# Patient Record
Sex: Female | Born: 1935 | Race: White | Hispanic: No | Marital: Married | Smoking: Never smoker
Health system: Southern US, Community
[De-identification: ages and names within clinical notes are randomized; demographics above are authoritative.]

## PROBLEM LIST (undated history)

## (undated) DIAGNOSIS — I10 Essential (primary) hypertension: Secondary | ICD-10-CM

## (undated) DIAGNOSIS — N189 Chronic kidney disease, unspecified: Secondary | ICD-10-CM

## (undated) DIAGNOSIS — K219 Gastro-esophageal reflux disease without esophagitis: Secondary | ICD-10-CM

## (undated) DIAGNOSIS — C801 Malignant (primary) neoplasm, unspecified: Secondary | ICD-10-CM

## (undated) DIAGNOSIS — D649 Anemia, unspecified: Secondary | ICD-10-CM

## (undated) DIAGNOSIS — R011 Cardiac murmur, unspecified: Secondary | ICD-10-CM

## (undated) DIAGNOSIS — M109 Gout, unspecified: Secondary | ICD-10-CM

## (undated) HISTORY — DX: Chronic kidney disease, unspecified: N18.9

## (undated) HISTORY — PX: EYE SURGERY: SHX253

## (undated) HISTORY — PX: PARATHYROIDECTOMY: SHX19

## (undated) HISTORY — PX: BREAST LUMPECTOMY: SHX2

## (undated) HISTORY — PX: APPENDECTOMY: SHX54

---

## 1998-02-01 ENCOUNTER — Ambulatory Visit (HOSPITAL_COMMUNITY): Admission: RE | Admit: 1998-02-01 | Discharge: 1998-02-01 | Payer: Self-pay | Admitting: Gastroenterology

## 1998-07-05 ENCOUNTER — Encounter: Payer: Self-pay | Admitting: General Surgery

## 1998-07-08 ENCOUNTER — Ambulatory Visit (HOSPITAL_COMMUNITY): Admission: RE | Admit: 1998-07-08 | Discharge: 1998-07-08 | Payer: Self-pay | Admitting: General Surgery

## 1998-10-08 ENCOUNTER — Other Ambulatory Visit: Admission: RE | Admit: 1998-10-08 | Discharge: 1998-10-08 | Payer: Self-pay | Admitting: Internal Medicine

## 1999-10-12 ENCOUNTER — Other Ambulatory Visit: Admission: RE | Admit: 1999-10-12 | Discharge: 1999-10-12 | Payer: Self-pay | Admitting: Internal Medicine

## 2000-03-12 ENCOUNTER — Encounter: Payer: Self-pay | Admitting: Orthopedic Surgery

## 2000-03-12 ENCOUNTER — Encounter: Admission: RE | Admit: 2000-03-12 | Discharge: 2000-03-12 | Payer: Self-pay | Admitting: Orthopedic Surgery

## 2001-07-31 ENCOUNTER — Ambulatory Visit (HOSPITAL_COMMUNITY): Admission: RE | Admit: 2001-07-31 | Discharge: 2001-07-31 | Payer: Self-pay | Admitting: Specialist

## 2002-03-19 ENCOUNTER — Encounter: Admission: RE | Admit: 2002-03-19 | Discharge: 2002-03-19 | Payer: Self-pay | Admitting: Internal Medicine

## 2002-03-19 ENCOUNTER — Encounter: Payer: Self-pay | Admitting: Internal Medicine

## 2002-12-08 ENCOUNTER — Ambulatory Visit (HOSPITAL_BASED_OUTPATIENT_CLINIC_OR_DEPARTMENT_OTHER): Admission: RE | Admit: 2002-12-08 | Discharge: 2002-12-08 | Payer: Self-pay | Admitting: General Surgery

## 2002-12-10 ENCOUNTER — Encounter: Payer: Self-pay | Admitting: General Surgery

## 2002-12-10 ENCOUNTER — Encounter (INDEPENDENT_AMBULATORY_CARE_PROVIDER_SITE_OTHER): Payer: Self-pay | Admitting: *Deleted

## 2002-12-10 ENCOUNTER — Ambulatory Visit (HOSPITAL_BASED_OUTPATIENT_CLINIC_OR_DEPARTMENT_OTHER): Admission: RE | Admit: 2002-12-10 | Discharge: 2002-12-10 | Payer: Self-pay | Admitting: General Surgery

## 2002-12-15 ENCOUNTER — Ambulatory Visit (HOSPITAL_COMMUNITY): Admission: RE | Admit: 2002-12-15 | Discharge: 2002-12-15 | Payer: Self-pay | Admitting: Internal Medicine

## 2002-12-26 ENCOUNTER — Ambulatory Visit: Admission: RE | Admit: 2002-12-26 | Discharge: 2003-02-18 | Payer: Self-pay | Admitting: Radiation Oncology

## 2003-07-07 ENCOUNTER — Emergency Department (HOSPITAL_COMMUNITY): Admission: EM | Admit: 2003-07-07 | Discharge: 2003-07-08 | Payer: Self-pay | Admitting: Emergency Medicine

## 2003-09-24 ENCOUNTER — Ambulatory Visit: Admission: RE | Admit: 2003-09-24 | Discharge: 2003-09-24 | Payer: Self-pay | Admitting: Radiation Oncology

## 2004-09-22 ENCOUNTER — Ambulatory Visit: Payer: Self-pay | Admitting: Oncology

## 2004-12-05 ENCOUNTER — Encounter: Admission: RE | Admit: 2004-12-05 | Discharge: 2004-12-05 | Payer: Self-pay | Admitting: Internal Medicine

## 2004-12-14 ENCOUNTER — Ambulatory Visit: Payer: Self-pay | Admitting: Oncology

## 2005-03-01 ENCOUNTER — Ambulatory Visit (HOSPITAL_COMMUNITY): Admission: RE | Admit: 2005-03-01 | Discharge: 2005-03-02 | Payer: Self-pay | Admitting: Nephrology

## 2005-03-01 ENCOUNTER — Encounter (INDEPENDENT_AMBULATORY_CARE_PROVIDER_SITE_OTHER): Payer: Self-pay | Admitting: *Deleted

## 2005-06-07 ENCOUNTER — Ambulatory Visit: Payer: Self-pay | Admitting: Oncology

## 2005-08-01 ENCOUNTER — Ambulatory Visit: Payer: Self-pay | Admitting: Oncology

## 2005-09-22 ENCOUNTER — Ambulatory Visit: Payer: Self-pay | Admitting: Oncology

## 2005-11-14 ENCOUNTER — Ambulatory Visit: Payer: Self-pay | Admitting: Oncology

## 2005-11-28 LAB — CBC & DIFF AND RETIC
BASO%: 0.4 % (ref 0.0–2.0)
Basophils Absolute: 0 10*3/uL (ref 0.0–0.1)
EOS%: 9 % — ABNORMAL HIGH (ref 0.0–7.0)
Eosinophils Absolute: 0.7 10*3/uL — ABNORMAL HIGH (ref 0.0–0.5)
HCT: 32.2 % — ABNORMAL LOW (ref 34.8–46.6)
HGB: 10.9 g/dL — ABNORMAL LOW (ref 11.6–15.9)
IRF: 0.38 — ABNORMAL HIGH (ref 0.130–0.330)
LYMPH%: 10.3 % — ABNORMAL LOW (ref 14.0–48.0)
MCH: 31.2 pg (ref 26.0–34.0)
MCHC: 33.8 g/dL (ref 32.0–36.0)
MCV: 92.1 fL (ref 81.0–101.0)
MONO#: 1 10*3/uL — ABNORMAL HIGH (ref 0.1–0.9)
MONO%: 12.2 % (ref 0.0–13.0)
NEUT#: 5.4 10*3/uL (ref 1.5–6.5)
NEUT%: 68.1 % (ref 39.6–76.8)
Platelets: 253 10*3/uL (ref 145–400)
RBC: 3.49 10*6/uL — ABNORMAL LOW (ref 3.70–5.32)
RDW: 18.9 % — ABNORMAL HIGH (ref 11.3–14.5)
RETIC #: 68.4 10*3/uL (ref 19.7–115.1)
Retic %: 2 % (ref 0.4–2.3)
WBC: 8 10*3/uL (ref 3.9–10.0)
lymph#: 0.8 10*3/uL — ABNORMAL LOW (ref 0.9–3.3)

## 2005-11-28 LAB — COMPREHENSIVE METABOLIC PANEL
ALT: 18 U/L (ref 0–40)
CO2: 25 mEq/L (ref 19–32)
Creatinine, Ser: 2 mg/dL — ABNORMAL HIGH (ref 0.4–1.2)
Total Bilirubin: 0.4 mg/dL (ref 0.3–1.2)

## 2005-11-28 LAB — FERRITIN: Ferritin: 162 ng/mL (ref 10–291)

## 2005-11-28 LAB — IRON AND TIBC
Iron: 93 ug/dL (ref 42–145)
TIBC: 328 ug/dL (ref 250–470)
UIBC: 235 ug/dL

## 2005-11-28 LAB — LACTATE DEHYDROGENASE: LDH: 193 U/L (ref 94–250)

## 2005-12-26 LAB — CBC WITH DIFFERENTIAL/PLATELET
Basophils Absolute: 0.1 10*3/uL (ref 0.0–0.1)
EOS%: 7.3 % — ABNORMAL HIGH (ref 0.0–7.0)
Eosinophils Absolute: 0.7 10*3/uL — ABNORMAL HIGH (ref 0.0–0.5)
HCT: 35.9 % (ref 34.8–46.6)
HGB: 12 g/dL (ref 11.6–15.9)
MCH: 31.7 pg (ref 26.0–34.0)
MCV: 95 fL (ref 81.0–101.0)
NEUT#: 7.1 10*3/uL — ABNORMAL HIGH (ref 1.5–6.5)
NEUT%: 74.1 % (ref 39.6–76.8)
RDW: 14.7 % — ABNORMAL HIGH (ref 11.3–14.5)
lymph#: 1 10*3/uL (ref 0.9–3.3)

## 2005-12-28 LAB — UIFE/LIGHT CHAINS/TP QN, 24-HR UR
Albumin, U: DETECTED
Alpha 1, Urine: DETECTED — AB
Alpha 2, Urine: DETECTED — AB
Free Kappa/Lambda Ratio: 13.41 ratio — ABNORMAL HIGH (ref 0.46–4.00)
Free Lambda Excretion/Day: 5.46 mg/d
Time: 24 hours
Total Protein, Urine: 9.2 mg/dL

## 2006-01-18 ENCOUNTER — Ambulatory Visit: Payer: Self-pay | Admitting: Oncology

## 2006-01-23 LAB — CBC WITH DIFFERENTIAL/PLATELET
BASO%: 0.3 % (ref 0.0–2.0)
HCT: 38.3 % (ref 34.8–46.6)
LYMPH%: 19.4 % (ref 14.0–48.0)
MCH: 31.2 pg (ref 26.0–34.0)
MCHC: 33.3 g/dL (ref 32.0–36.0)
MONO#: 0.7 10*3/uL (ref 0.1–0.9)
NEUT%: 55.1 % (ref 39.6–76.8)
Platelets: 290 10*3/uL (ref 145–400)

## 2006-02-20 LAB — CBC WITH DIFFERENTIAL/PLATELET
BASO%: 0.3 % (ref 0.0–2.0)
Basophils Absolute: 0 10*3/uL (ref 0.0–0.1)
EOS%: 11.2 % — ABNORMAL HIGH (ref 0.0–7.0)
HCT: 37.4 % (ref 34.8–46.6)
HGB: 12.5 g/dL (ref 11.6–15.9)
MCH: 30.9 pg (ref 26.0–34.0)
MONO#: 0.8 10*3/uL (ref 0.1–0.9)
NEUT%: 62.9 % (ref 39.6–76.8)
RDW: 15.2 % — ABNORMAL HIGH (ref 11.3–14.5)
WBC: 7.1 10*3/uL (ref 3.9–10.0)
lymph#: 1.1 10*3/uL (ref 0.9–3.3)

## 2006-03-15 ENCOUNTER — Ambulatory Visit: Payer: Self-pay | Admitting: Oncology

## 2006-03-20 LAB — CBC WITH DIFFERENTIAL/PLATELET
BASO%: 0.1 % (ref 0.0–2.0)
EOS%: 2.2 % (ref 0.0–7.0)
MCH: 30.9 pg (ref 26.0–34.0)
MCHC: 33.8 g/dL (ref 32.0–36.0)
MONO#: 1 10*3/uL — ABNORMAL HIGH (ref 0.1–0.9)
RBC: 3.85 10*6/uL (ref 3.70–5.32)
RDW: 16.4 % — ABNORMAL HIGH (ref 11.3–14.5)
WBC: 12.3 10*3/uL — ABNORMAL HIGH (ref 3.9–10.0)
lymph#: 1 10*3/uL (ref 0.9–3.3)

## 2006-05-11 ENCOUNTER — Ambulatory Visit: Payer: Self-pay | Admitting: Oncology

## 2006-05-15 LAB — CHCC SMEAR

## 2006-05-15 LAB — COMPREHENSIVE METABOLIC PANEL
ALT: 19 U/L (ref 0–40)
BUN: 52 mg/dL — ABNORMAL HIGH (ref 6–23)
CO2: 25 mEq/L (ref 19–32)
Calcium: 9.2 mg/dL (ref 8.4–10.5)
Chloride: 106 mEq/L (ref 96–112)
Creatinine, Ser: 2.15 mg/dL — ABNORMAL HIGH (ref 0.40–1.20)
Glucose, Bld: 112 mg/dL — ABNORMAL HIGH (ref 70–99)

## 2006-05-15 LAB — CBC WITH DIFFERENTIAL/PLATELET
Basophils Absolute: 0 10*3/uL (ref 0.0–0.1)
Eosinophils Absolute: 0.8 10*3/uL — ABNORMAL HIGH (ref 0.0–0.5)
HGB: 11.7 g/dL (ref 11.6–15.9)
MONO#: 0.7 10*3/uL (ref 0.1–0.9)
NEUT#: 5.8 10*3/uL (ref 1.5–6.5)
RDW: 16.9 % — ABNORMAL HIGH (ref 11.3–14.5)
lymph#: 1 10*3/uL (ref 0.9–3.3)

## 2006-05-15 LAB — MORPHOLOGY: PLT EST: ADEQUATE

## 2006-05-15 LAB — IRON AND TIBC: UIBC: 196 ug/dL

## 2006-05-15 LAB — LACTATE DEHYDROGENASE: LDH: 202 U/L (ref 94–250)

## 2006-05-28 LAB — UIFE/LIGHT CHAINS/TP QN, 24-HR UR
Albumin, U: DETECTED
Beta, Urine: DETECTED — AB
Free Kappa/Lambda Ratio: 14.9 ratio — ABNORMAL HIGH (ref 0.46–4.00)
Free Lambda Excretion/Day: 3.92 mg/d
Free Lambda Lt Chains,Ur: 0.29 mg/dL (ref 0.08–1.01)
Free Lt Chn Excr Rate: 58.32 mg/d
Time: 24 hours
Total Protein, Urine-Ur/day: 139 mg/d (ref 10–140)
Total Protein, Urine: 10.3 mg/dL
Volume, Urine: 1350 mL

## 2006-07-04 ENCOUNTER — Ambulatory Visit: Payer: Self-pay | Admitting: Oncology

## 2006-07-06 LAB — CBC WITH DIFFERENTIAL/PLATELET
BASO%: 0.7 % (ref 0.0–2.0)
Basophils Absolute: 0 10*3/uL (ref 0.0–0.1)
EOS%: 6.6 % (ref 0.0–7.0)
HGB: 12.9 g/dL (ref 11.6–15.9)
MCH: 30.8 pg (ref 26.0–34.0)
MCHC: 33.1 g/dL (ref 32.0–36.0)
RDW: 15.6 % — ABNORMAL HIGH (ref 11.3–14.5)
WBC: 6.9 10*3/uL (ref 3.9–10.0)
lymph#: 0.9 10*3/uL (ref 0.9–3.3)

## 2006-08-03 LAB — CBC WITH DIFFERENTIAL/PLATELET
BASO%: 0.3 % (ref 0.0–2.0)
Basophils Absolute: 0 10*3/uL (ref 0.0–0.1)
EOS%: 6.3 % (ref 0.0–7.0)
Eosinophils Absolute: 0.5 10*3/uL (ref 0.0–0.5)
HCT: 38.2 % (ref 34.8–46.6)
HGB: 12.6 g/dL (ref 11.6–15.9)
LYMPH%: 10.7 % — ABNORMAL LOW (ref 14.0–48.0)
MCH: 30.3 pg (ref 26.0–34.0)
MCHC: 32.9 g/dL (ref 32.0–36.0)
MCV: 92.1 fL (ref 81.0–101.0)
MONO#: 0.6 10*3/uL (ref 0.1–0.9)
MONO%: 7.7 % (ref 0.0–13.0)
NEUT#: 5.6 10*3/uL (ref 1.5–6.5)
NEUT%: 75 % (ref 39.6–76.8)
Platelets: 260 10*3/uL (ref 145–400)
RBC: 4.14 10*6/uL (ref 3.70–5.32)
RDW: 16.2 % — ABNORMAL HIGH (ref 11.3–14.5)
WBC: 7.5 10*3/uL (ref 3.9–10.0)
lymph#: 0.8 10*3/uL — ABNORMAL LOW (ref 0.9–3.3)

## 2006-08-27 ENCOUNTER — Ambulatory Visit: Payer: Self-pay | Admitting: Oncology

## 2006-08-31 LAB — CBC WITH DIFFERENTIAL/PLATELET
Basophils Absolute: 0 10*3/uL (ref 0.0–0.1)
EOS%: 6.1 % (ref 0.0–7.0)
HGB: 11.2 g/dL — ABNORMAL LOW (ref 11.6–15.9)
LYMPH%: 8.3 % — ABNORMAL LOW (ref 14.0–48.0)
MCH: 30.8 pg (ref 26.0–34.0)
MCV: 92.5 fL (ref 81.0–101.0)
MONO%: 11 % (ref 0.0–13.0)
Platelets: 237 10*3/uL (ref 145–400)
RBC: 3.65 10*6/uL — ABNORMAL LOW (ref 3.70–5.32)
RDW: 15.9 % — ABNORMAL HIGH (ref 11.3–14.5)

## 2006-09-28 LAB — CBC WITH DIFFERENTIAL/PLATELET
BASO%: 0.3 % (ref 0.0–2.0)
EOS%: 1.5 % (ref 0.0–7.0)
Eosinophils Absolute: 0.2 10*3/uL (ref 0.0–0.5)
LYMPH%: 6.3 % — ABNORMAL LOW (ref 14.0–48.0)
MCH: 31.4 pg (ref 26.0–34.0)
MCHC: 34.1 g/dL (ref 32.0–36.0)
MCV: 92.2 fL (ref 81.0–101.0)
MONO%: 6.1 % (ref 0.0–13.0)
NEUT#: 8.8 10*3/uL — ABNORMAL HIGH (ref 1.5–6.5)
RBC: 3.84 10*6/uL (ref 3.70–5.32)
RDW: 17.8 % — ABNORMAL HIGH (ref 11.3–14.5)

## 2006-10-30 ENCOUNTER — Ambulatory Visit: Payer: Self-pay | Admitting: Oncology

## 2006-11-02 LAB — CBC WITH DIFFERENTIAL/PLATELET
BASO%: 0.1 % (ref 0.0–2.0)
Eosinophils Absolute: 0.4 10*3/uL (ref 0.0–0.5)
MCHC: 33.6 g/dL (ref 32.0–36.0)
MONO#: 0.3 10*3/uL (ref 0.1–0.9)
NEUT#: 10.6 10*3/uL — ABNORMAL HIGH (ref 1.5–6.5)
RBC: 3.96 10*6/uL (ref 3.70–5.32)
RDW: 17.3 % — ABNORMAL HIGH (ref 11.3–14.5)
WBC: 12 10*3/uL — ABNORMAL HIGH (ref 3.9–10.0)
lymph#: 0.7 10*3/uL — ABNORMAL LOW (ref 0.9–3.3)

## 2006-11-23 LAB — IRON AND TIBC
%SAT: 23 % (ref 20–55)
Iron: 67 ug/dL (ref 42–145)
UIBC: 219 ug/dL

## 2006-11-23 LAB — COMPREHENSIVE METABOLIC PANEL
Alkaline Phosphatase: 92 U/L (ref 39–117)
CO2: 21 mEq/L (ref 19–32)
Creatinine, Ser: 2.31 mg/dL — ABNORMAL HIGH (ref 0.40–1.20)
Glucose, Bld: 138 mg/dL — ABNORMAL HIGH (ref 70–99)
Sodium: 140 mEq/L (ref 135–145)
Total Bilirubin: 0.3 mg/dL (ref 0.3–1.2)

## 2006-11-23 LAB — FERRITIN: Ferritin: 140 ng/mL (ref 10–291)

## 2006-11-23 LAB — LACTATE DEHYDROGENASE: LDH: 200 U/L (ref 94–250)

## 2006-12-17 ENCOUNTER — Ambulatory Visit: Payer: Self-pay | Admitting: Oncology

## 2006-12-26 LAB — CBC WITH DIFFERENTIAL/PLATELET
BASO%: 0.1 % (ref 0.0–2.0)
EOS%: 2.5 % (ref 0.0–7.0)
HGB: 12 g/dL (ref 11.6–15.9)
MCH: 32 pg (ref 26.0–34.0)
MCHC: 34.4 g/dL (ref 32.0–36.0)
RBC: 3.74 10*6/uL (ref 3.70–5.32)
RDW: 15.9 % — ABNORMAL HIGH (ref 11.3–14.5)
lymph#: 0.8 10*3/uL — ABNORMAL LOW (ref 0.9–3.3)

## 2007-01-23 LAB — CBC WITH DIFFERENTIAL/PLATELET
Basophils Absolute: 0 10*3/uL (ref 0.0–0.1)
EOS%: 2.7 % (ref 0.0–7.0)
Eosinophils Absolute: 0.3 10*3/uL (ref 0.0–0.5)
HGB: 12.8 g/dL (ref 11.6–15.9)
NEUT#: 8.4 10*3/uL — ABNORMAL HIGH (ref 1.5–6.5)
RBC: 4.15 10*6/uL (ref 3.70–5.32)
RDW: 15.8 % — ABNORMAL HIGH (ref 11.3–14.5)
lymph#: 0.8 10*3/uL — ABNORMAL LOW (ref 0.9–3.3)

## 2007-02-18 ENCOUNTER — Ambulatory Visit: Payer: Self-pay | Admitting: Oncology

## 2007-02-20 LAB — CBC WITH DIFFERENTIAL/PLATELET
Basophils Absolute: 0 10*3/uL (ref 0.0–0.1)
Eosinophils Absolute: 0.1 10*3/uL (ref 0.0–0.5)
HGB: 11.8 g/dL (ref 11.6–15.9)
MCV: 91.9 fL (ref 81.0–101.0)
MONO#: 0.9 10*3/uL (ref 0.1–0.9)
MONO%: 8.6 % (ref 0.0–13.0)
NEUT#: 8.7 10*3/uL — ABNORMAL HIGH (ref 1.5–6.5)
Platelets: 187 10*3/uL (ref 145–400)
RDW: 16.1 % — ABNORMAL HIGH (ref 11.3–14.5)
WBC: 10.3 10*3/uL — ABNORMAL HIGH (ref 3.9–10.0)

## 2007-03-20 LAB — CBC WITH DIFFERENTIAL/PLATELET
Basophils Absolute: 0 10*3/uL (ref 0.0–0.1)
Eosinophils Absolute: 0.2 10*3/uL (ref 0.0–0.5)
HCT: 40.8 % (ref 34.8–46.6)
HGB: 13.6 g/dL (ref 11.6–15.9)
LYMPH%: 7.5 % — ABNORMAL LOW (ref 14.0–48.0)
MCV: 94.2 fL (ref 81.0–101.0)
MONO%: 7.4 % (ref 0.0–13.0)
NEUT#: 7.8 10*3/uL — ABNORMAL HIGH (ref 1.5–6.5)
NEUT%: 83.1 % — ABNORMAL HIGH (ref 39.6–76.8)
Platelets: 241 10*3/uL (ref 145–400)
RBC: 4.33 10*6/uL (ref 3.70–5.32)

## 2007-04-12 ENCOUNTER — Ambulatory Visit: Payer: Self-pay | Admitting: Oncology

## 2007-04-17 LAB — CBC WITH DIFFERENTIAL/PLATELET
BASO%: 0.3 % (ref 0.0–2.0)
HCT: 37.6 % (ref 34.8–46.6)
LYMPH%: 9.3 % — ABNORMAL LOW (ref 14.0–48.0)
MCH: 32 pg (ref 26.0–34.0)
MCHC: 35 g/dL (ref 32.0–36.0)
MCV: 91.3 fL (ref 81.0–101.0)
MONO%: 8.3 % (ref 0.0–13.0)
NEUT%: 76.5 % (ref 39.6–76.8)
Platelets: 227 10*3/uL (ref 145–400)
RBC: 4.12 10*6/uL (ref 3.70–5.32)

## 2007-05-15 LAB — CBC WITH DIFFERENTIAL/PLATELET
BASO%: 0.3 % (ref 0.0–2.0)
EOS%: 6.4 % (ref 0.0–7.0)
LYMPH%: 9.1 % — ABNORMAL LOW (ref 14.0–48.0)
MCH: 32 pg (ref 26.0–34.0)
MCHC: 34.8 g/dL (ref 32.0–36.0)
MONO#: 0.9 10*3/uL (ref 0.1–0.9)
NEUT%: 75.4 % (ref 39.6–76.8)
RBC: 3.79 10*6/uL (ref 3.70–5.32)
WBC: 10 10*3/uL (ref 3.9–10.0)
lymph#: 0.9 10*3/uL (ref 0.9–3.3)

## 2007-06-12 ENCOUNTER — Ambulatory Visit: Payer: Self-pay | Admitting: Oncology

## 2007-06-12 LAB — IRON AND TIBC
%SAT: 23 % (ref 20–55)
Iron: 58 ug/dL (ref 42–145)
TIBC: 253 ug/dL (ref 250–470)
UIBC: 195 ug/dL

## 2007-06-12 LAB — CBC & DIFF AND RETIC
BASO%: 0.3 % (ref 0.0–2.0)
Basophils Absolute: 0 10*3/uL (ref 0.0–0.1)
EOS%: 3.7 % (ref 0.0–7.0)
Eosinophils Absolute: 0.4 10*3/uL (ref 0.0–0.5)
HCT: 36 % (ref 34.8–46.6)
HGB: 12.3 g/dL (ref 11.6–15.9)
IRF: 0.29 (ref 0.130–0.330)
LYMPH%: 6.5 % — ABNORMAL LOW (ref 14.0–48.0)
MCH: 32.2 pg (ref 26.0–34.0)
MCHC: 34.3 g/dL (ref 32.0–36.0)
MCV: 94 fL (ref 81.0–101.0)
MONO#: 0.7 10*3/uL (ref 0.1–0.9)
MONO%: 6.3 % (ref 0.0–13.0)
NEUT#: 8.7 10*3/uL — ABNORMAL HIGH (ref 1.5–6.5)
NEUT%: 83.2 % — ABNORMAL HIGH (ref 39.6–76.8)
Platelets: 268 10*3/uL (ref 145–400)
RBC: 3.83 10*6/uL (ref 3.70–5.32)
RDW: 15.7 % — ABNORMAL HIGH (ref 11.3–14.5)
RETIC #: 37.2 10*3/uL (ref 19.7–115.1)
Retic %: 1 % (ref 0.4–2.3)
WBC: 10.4 10*3/uL — ABNORMAL HIGH (ref 3.9–10.0)
lymph#: 0.7 10*3/uL — ABNORMAL LOW (ref 0.9–3.3)

## 2007-06-12 LAB — COMPREHENSIVE METABOLIC PANEL
Alkaline Phosphatase: 80 U/L (ref 39–117)
BUN: 53 mg/dL — ABNORMAL HIGH (ref 6–23)
Creatinine, Ser: 2.25 mg/dL — ABNORMAL HIGH (ref 0.40–1.20)
Glucose, Bld: 103 mg/dL — ABNORMAL HIGH (ref 70–99)
Sodium: 142 mEq/L (ref 135–145)
Total Bilirubin: 0.5 mg/dL (ref 0.3–1.2)

## 2007-06-12 LAB — MORPHOLOGY: PLT EST: ADEQUATE

## 2007-06-12 LAB — CHCC SMEAR

## 2007-06-12 LAB — FERRITIN: Ferritin: 181 ng/mL (ref 10–291)

## 2007-07-10 LAB — CBC WITH DIFFERENTIAL/PLATELET
BASO%: 0.1 % (ref 0.0–2.0)
EOS%: 5.9 % (ref 0.0–7.0)
MCH: 32.1 pg (ref 26.0–34.0)
MCHC: 34.9 g/dL (ref 32.0–36.0)
MONO%: 6.6 % (ref 0.0–13.0)
NEUT%: 80.6 % — ABNORMAL HIGH (ref 39.6–76.8)
RDW: 16 % — ABNORMAL HIGH (ref 11.3–14.5)
lymph#: 0.8 10*3/uL — ABNORMAL LOW (ref 0.9–3.3)

## 2007-08-05 ENCOUNTER — Ambulatory Visit: Payer: Self-pay | Admitting: Oncology

## 2007-08-07 LAB — CBC WITH DIFFERENTIAL/PLATELET
BASO%: 0.3 % (ref 0.0–2.0)
EOS%: 8.9 % — ABNORMAL HIGH (ref 0.0–7.0)
HCT: 37.6 % (ref 34.8–46.6)
LYMPH%: 9 % — ABNORMAL LOW (ref 14.0–48.0)
MCH: 32 pg (ref 26.0–34.0)
MCHC: 33.7 g/dL (ref 32.0–36.0)
NEUT%: 75.8 % (ref 39.6–76.8)
Platelets: 260 10*3/uL (ref 145–400)

## 2007-09-04 LAB — CBC WITH DIFFERENTIAL/PLATELET
BASO%: 0.3 % (ref 0.0–2.0)
EOS%: 3 % (ref 0.0–7.0)
MCH: 31.6 pg (ref 26.0–34.0)
MCHC: 33.8 g/dL (ref 32.0–36.0)
RBC: 3.88 10*6/uL (ref 3.70–5.32)
RDW: 16 % — ABNORMAL HIGH (ref 11.3–14.5)
lymph#: 0.6 10*3/uL — ABNORMAL LOW (ref 0.9–3.3)

## 2007-09-30 ENCOUNTER — Ambulatory Visit: Payer: Self-pay | Admitting: Oncology

## 2007-10-02 LAB — CBC WITH DIFFERENTIAL/PLATELET
BASO%: 0 % (ref 0.0–2.0)
EOS%: 5.6 % (ref 0.0–7.0)
HCT: 33.6 % — ABNORMAL LOW (ref 34.8–46.6)
LYMPH%: 6.4 % — ABNORMAL LOW (ref 14.0–48.0)
MCH: 31.8 pg (ref 26.0–34.0)
MCHC: 34 g/dL (ref 32.0–36.0)
MCV: 93.6 fL (ref 81.0–101.0)
MONO#: 0.8 10*3/uL (ref 0.1–0.9)
MONO%: 6.9 % (ref 0.0–13.0)
NEUT%: 81.1 % — ABNORMAL HIGH (ref 39.6–76.8)
Platelets: 276 10*3/uL (ref 145–400)
RBC: 3.59 10*6/uL — ABNORMAL LOW (ref 3.70–5.32)

## 2007-10-30 LAB — CBC WITH DIFFERENTIAL/PLATELET
BASO%: 0 % (ref 0.0–2.0)
HCT: 35.7 % (ref 34.8–46.6)
MCHC: 32.8 g/dL (ref 32.0–36.0)
MONO#: 0.9 10*3/uL (ref 0.1–0.9)
RBC: 3.87 10*6/uL (ref 3.70–5.32)
WBC: 12.3 10*3/uL — ABNORMAL HIGH (ref 3.9–10.0)
lymph#: 1 10*3/uL (ref 0.9–3.3)

## 2007-11-25 ENCOUNTER — Ambulatory Visit: Payer: Self-pay | Admitting: Oncology

## 2007-11-27 LAB — CBC WITH DIFFERENTIAL/PLATELET
BASO%: 0.6 % (ref 0.0–2.0)
LYMPH%: 8.6 % — ABNORMAL LOW (ref 14.0–48.0)
MCHC: 33.2 g/dL (ref 32.0–36.0)
MONO#: 1 10*3/uL — ABNORMAL HIGH (ref 0.1–0.9)
MONO%: 10.3 % (ref 0.0–13.0)
Platelets: 224 10*3/uL (ref 145–400)
RBC: 4.19 10*6/uL (ref 3.70–5.32)
WBC: 10 10*3/uL (ref 3.9–10.0)

## 2008-01-01 LAB — CBC WITH DIFFERENTIAL/PLATELET
EOS%: 1.9 % (ref 0.0–7.0)
Eosinophils Absolute: 0.2 10*3/uL (ref 0.0–0.5)
LYMPH%: 9.6 % — ABNORMAL LOW (ref 14.0–48.0)
MCH: 31.8 pg (ref 26.0–34.0)
MCHC: 33.8 g/dL (ref 32.0–36.0)
MCV: 94 fL (ref 81.0–101.0)
MONO%: 7.5 % (ref 0.0–13.0)
NEUT#: 6.8 10*3/uL — ABNORMAL HIGH (ref 1.5–6.5)
Platelets: 162 10*3/uL (ref 145–400)
RBC: 3.65 10*6/uL — ABNORMAL LOW (ref 3.70–5.32)

## 2008-01-27 ENCOUNTER — Ambulatory Visit: Payer: Self-pay | Admitting: Oncology

## 2008-01-29 LAB — CBC WITH DIFFERENTIAL/PLATELET
Basophils Absolute: 0 10*3/uL (ref 0.0–0.1)
Eosinophils Absolute: 0.2 10*3/uL (ref 0.0–0.5)
HCT: 39.4 % (ref 34.8–46.6)
HGB: 13.1 g/dL (ref 11.6–15.9)
MCV: 93.5 fL (ref 81.0–101.0)
NEUT#: 7.1 10*3/uL — ABNORMAL HIGH (ref 1.5–6.5)
NEUT%: 81.7 % — ABNORMAL HIGH (ref 39.6–76.8)
RDW: 16.5 % — ABNORMAL HIGH (ref 11.3–14.5)
lymph#: 0.8 10*3/uL — ABNORMAL LOW (ref 0.9–3.3)

## 2008-02-25 LAB — CBC WITH DIFFERENTIAL/PLATELET
Basophils Absolute: 0 10*3/uL (ref 0.0–0.1)
Eosinophils Absolute: 0 10*3/uL (ref 0.0–0.5)
HCT: 37.2 % (ref 34.8–46.6)
HGB: 12.5 g/dL (ref 11.6–15.9)
LYMPH%: 4.9 % — ABNORMAL LOW (ref 14.0–48.0)
MCH: 31 pg (ref 26.0–34.0)
MCV: 92.7 fL (ref 81.0–101.0)
MONO%: 5.2 % (ref 0.0–13.0)
NEUT#: 14.9 10*3/uL — ABNORMAL HIGH (ref 1.5–6.5)
NEUT%: 89.5 % — ABNORMAL HIGH (ref 39.6–76.8)
Platelets: 200 10*3/uL (ref 145–400)

## 2008-03-20 ENCOUNTER — Ambulatory Visit: Payer: Self-pay | Admitting: Oncology

## 2008-03-25 LAB — CBC WITH DIFFERENTIAL/PLATELET
Basophils Absolute: 0 10*3/uL (ref 0.0–0.1)
Eosinophils Absolute: 0.4 10*3/uL (ref 0.0–0.5)
HGB: 11.6 g/dL (ref 11.6–15.9)
LYMPH%: 5.2 % — ABNORMAL LOW (ref 14.0–48.0)
MCV: 93.4 fL (ref 81.0–101.0)
MONO%: 7.8 % (ref 0.0–13.0)
NEUT#: 8.8 10*3/uL — ABNORMAL HIGH (ref 1.5–6.5)
Platelets: 167 10*3/uL (ref 145–400)
RDW: 15.4 % — ABNORMAL HIGH (ref 11.3–14.5)

## 2008-04-22 LAB — CBC WITH DIFFERENTIAL/PLATELET
Basophils Absolute: 0 10*3/uL (ref 0.0–0.1)
Eosinophils Absolute: 0.5 10*3/uL (ref 0.0–0.5)
HGB: 12.3 g/dL (ref 11.6–15.9)
MONO#: 0.7 10*3/uL (ref 0.1–0.9)
NEUT#: 6.6 10*3/uL — ABNORMAL HIGH (ref 1.5–6.5)
RDW: 16.6 % — ABNORMAL HIGH (ref 11.3–14.5)
lymph#: 0.8 10*3/uL — ABNORMAL LOW (ref 0.9–3.3)

## 2008-05-18 ENCOUNTER — Ambulatory Visit: Payer: Self-pay | Admitting: Oncology

## 2008-05-20 LAB — CBC WITH DIFFERENTIAL/PLATELET
Basophils Absolute: 0 10*3/uL (ref 0.0–0.1)
Eosinophils Absolute: 0.4 10*3/uL (ref 0.0–0.5)
HCT: 34.8 % (ref 34.8–46.6)
LYMPH%: 6.4 % — ABNORMAL LOW (ref 14.0–48.0)
MCV: 91.8 fL (ref 81.0–101.0)
MONO#: 0.8 10*3/uL (ref 0.1–0.9)
MONO%: 8.7 % (ref 0.0–13.0)
NEUT#: 7.3 10*3/uL — ABNORMAL HIGH (ref 1.5–6.5)
NEUT%: 80.7 % — ABNORMAL HIGH (ref 39.6–76.8)
Platelets: 178 10*3/uL (ref 145–400)
RBC: 3.79 10*6/uL (ref 3.70–5.32)

## 2008-05-22 LAB — COMPREHENSIVE METABOLIC PANEL
Alkaline Phosphatase: 77 U/L (ref 39–117)
BUN: 66 mg/dL — ABNORMAL HIGH (ref 6–23)
CO2: 27 mEq/L (ref 19–32)
Creatinine, Ser: 2.82 mg/dL — ABNORMAL HIGH (ref 0.40–1.20)
Glucose, Bld: 105 mg/dL — ABNORMAL HIGH (ref 70–99)
Sodium: 143 mEq/L (ref 135–145)
Total Bilirubin: 0.5 mg/dL (ref 0.3–1.2)

## 2008-05-22 LAB — FERRITIN: Ferritin: 231 ng/mL (ref 10–291)

## 2008-05-22 LAB — LACTATE DEHYDROGENASE: LDH: 228 U/L (ref 94–250)

## 2008-05-22 LAB — PROTEIN ELECTROPHORESIS, SERUM
Albumin ELP: 56.7 % (ref 55.8–66.1)
Total Protein, Serum Electrophoresis: 6 g/dL (ref 6.0–8.3)

## 2008-05-22 LAB — IRON AND TIBC: %SAT: 23 % (ref 20–55)

## 2008-06-18 ENCOUNTER — Ambulatory Visit: Payer: Self-pay | Admitting: Surgery

## 2008-06-30 LAB — CBC WITH DIFFERENTIAL/PLATELET
Basophils Absolute: 0 10*3/uL (ref 0.0–0.1)
Eosinophils Absolute: 0.4 10*3/uL (ref 0.0–0.5)
HCT: 36.7 % (ref 34.8–46.6)
HGB: 12.3 g/dL (ref 11.6–15.9)
LYMPH%: 11.3 % — ABNORMAL LOW (ref 14.0–48.0)
MONO#: 0.6 10*3/uL (ref 0.1–0.9)
NEUT#: 5.9 10*3/uL (ref 1.5–6.5)
NEUT%: 75.7 % (ref 39.6–76.8)
Platelets: 172 10*3/uL (ref 145–400)
WBC: 7.8 10*3/uL (ref 3.9–10.0)

## 2008-07-02 LAB — PROTEIN ELECTROPHORESIS, SERUM
Albumin ELP: 59.2 % (ref 55.8–66.1)
Total Protein, Serum Electrophoresis: 6.4 g/dL (ref 6.0–8.3)

## 2008-07-02 LAB — COMPREHENSIVE METABOLIC PANEL
Alkaline Phosphatase: 80 U/L (ref 39–117)
Creatinine, Ser: 2.8 mg/dL — ABNORMAL HIGH (ref 0.40–1.20)
Glucose, Bld: 91 mg/dL (ref 70–99)
Sodium: 145 mEq/L (ref 135–145)
Total Bilirubin: 0.4 mg/dL (ref 0.3–1.2)
Total Protein: 6.4 g/dL (ref 6.0–8.3)

## 2008-07-02 LAB — IRON AND TIBC
%SAT: 20 % (ref 20–55)
TIBC: 278 ug/dL (ref 250–470)

## 2008-07-02 LAB — FOLATE RBC: RBC Folate: 2254 ng/mL — ABNORMAL HIGH (ref 180–600)

## 2008-07-29 ENCOUNTER — Ambulatory Visit: Payer: Self-pay | Admitting: Oncology

## 2008-07-31 LAB — CBC WITH DIFFERENTIAL/PLATELET
Eosinophils Absolute: 0.8 10*3/uL — ABNORMAL HIGH (ref 0.0–0.5)
LYMPH%: 13.5 % — ABNORMAL LOW (ref 14.0–48.0)
MONO#: 0.8 10*3/uL (ref 0.1–0.9)
NEUT#: 5.3 10*3/uL (ref 1.5–6.5)
Platelets: 234 10*3/uL (ref 145–400)
RBC: 3.89 10*6/uL (ref 3.70–5.32)
RDW: 16.9 % — ABNORMAL HIGH (ref 11.3–14.5)
WBC: 7.9 10*3/uL (ref 3.9–10.0)

## 2008-08-26 LAB — CBC WITH DIFFERENTIAL/PLATELET
BASO%: 0 % (ref 0.0–2.0)
Basophils Absolute: 0 10*3/uL (ref 0.0–0.1)
EOS%: 0 % (ref 0.0–7.0)
Eosinophils Absolute: 0 10*3/uL (ref 0.0–0.5)
HCT: 37 % (ref 34.8–46.6)
HGB: 12.2 g/dL (ref 11.6–15.9)
LYMPH%: 4.6 % — ABNORMAL LOW (ref 14.0–48.0)
MCH: 30.1 pg (ref 26.0–34.0)
MCHC: 33 g/dL (ref 32.0–36.0)
MCV: 91.4 fL (ref 81.0–101.0)
MONO#: 0.6 10*3/uL (ref 0.1–0.9)
MONO%: 5 % (ref 0.0–13.0)
NEUT#: 10.4 10*3/uL — ABNORMAL HIGH (ref 1.5–6.5)
NEUT%: 90.4 % — ABNORMAL HIGH (ref 39.6–76.8)
Platelets: 248 10*3/uL (ref 145–400)
RBC: 4.05 10*6/uL (ref 3.70–5.32)
RDW: 17.5 % — ABNORMAL HIGH (ref 11.3–14.5)
WBC: 11.5 10*3/uL — ABNORMAL HIGH (ref 3.9–10.0)
lymph#: 0.5 10*3/uL — ABNORMAL LOW (ref 0.9–3.3)

## 2008-09-17 ENCOUNTER — Ambulatory Visit: Payer: Self-pay

## 2008-09-21 ENCOUNTER — Ambulatory Visit: Payer: Self-pay | Admitting: Oncology

## 2008-09-23 LAB — CBC WITH DIFFERENTIAL/PLATELET
Eosinophils Absolute: 1.3 10*3/uL — ABNORMAL HIGH (ref 0.0–0.5)
MCV: 90.9 fL (ref 81.0–101.0)
MONO#: 0.8 10*3/uL (ref 0.1–0.9)
MONO%: 7.9 % (ref 0.0–13.0)
NEUT#: 7.3 10*3/uL — ABNORMAL HIGH (ref 1.5–6.5)
RBC: 3.76 10*6/uL (ref 3.70–5.32)
RDW: 17.2 % — ABNORMAL HIGH (ref 11.3–14.5)
WBC: 10.6 10*3/uL — ABNORMAL HIGH (ref 3.9–10.0)
lymph#: 1.1 10*3/uL (ref 0.9–3.3)

## 2008-10-21 LAB — CBC WITH DIFFERENTIAL/PLATELET
BASO%: 0.2 % (ref 0.0–2.0)
EOS%: 5.9 % (ref 0.0–7.0)
MCH: 30.7 pg (ref 25.1–34.0)
MCHC: 33.3 g/dL (ref 31.5–36.0)
MCV: 92.1 fL (ref 79.5–101.0)
MONO%: 7.7 % (ref 0.0–14.0)
RBC: 3.96 10*6/uL (ref 3.70–5.45)
RDW: 17.4 % — ABNORMAL HIGH (ref 11.2–14.5)

## 2008-11-16 ENCOUNTER — Ambulatory Visit: Payer: Self-pay | Admitting: Oncology

## 2008-11-18 LAB — CBC WITH DIFFERENTIAL/PLATELET
Basophils Absolute: 0 10*3/uL (ref 0.0–0.1)
EOS%: 0.7 % (ref 0.0–7.0)
HCT: 38.7 % (ref 34.8–46.6)
HGB: 12.8 g/dL (ref 11.6–15.9)
MCH: 30 pg (ref 25.1–34.0)
MCHC: 33.1 g/dL (ref 31.5–36.0)
MCV: 90.7 fL (ref 79.5–101.0)
MONO%: 7.6 % (ref 0.0–14.0)
NEUT%: 84 % — ABNORMAL HIGH (ref 38.4–76.8)

## 2008-12-15 LAB — CBC & DIFF AND RETIC
BASO%: 0.2 % (ref 0.0–2.0)
Basophils Absolute: 0 10*3/uL (ref 0.0–0.1)
EOS%: 2.5 % (ref 0.0–7.0)
MCH: 30 pg (ref 25.1–34.0)
MCHC: 33 g/dL (ref 31.5–36.0)
MCV: 90.8 fL (ref 79.5–101.0)
MONO%: 5.3 % (ref 0.0–14.0)
RBC: 4.1 10*6/uL (ref 3.70–5.45)
RDW: 17.3 % — ABNORMAL HIGH (ref 11.2–14.5)
RETIC #: 29.1 10*3/uL (ref 19.7–115.1)

## 2008-12-15 LAB — MORPHOLOGY: PLT EST: ADEQUATE

## 2008-12-18 LAB — IRON AND TIBC
%SAT: 24 % (ref 20–55)
TIBC: 248 ug/dL — ABNORMAL LOW (ref 250–470)
UIBC: 189 ug/dL

## 2008-12-18 LAB — COMPREHENSIVE METABOLIC PANEL
ALT: 16 U/L (ref 0–35)
AST: 17 U/L (ref 0–37)
Creatinine, Ser: 2.11 mg/dL — ABNORMAL HIGH (ref 0.40–1.20)
Sodium: 143 mEq/L (ref 135–145)
Total Bilirubin: 0.4 mg/dL (ref 0.3–1.2)

## 2008-12-18 LAB — SPEP & IFE WITH QIG
Albumin ELP: 56.5 % (ref 55.8–66.1)
Alpha-1-Globulin: 8 % — ABNORMAL HIGH (ref 2.9–4.9)
Alpha-2-Globulin: 16 % — ABNORMAL HIGH (ref 7.1–11.8)
Beta 2: 3.7 % (ref 3.2–6.5)
Beta Globulin: 6.4 % (ref 4.7–7.2)

## 2008-12-18 LAB — KAPPA/LAMBDA LIGHT CHAINS
Kappa free light chain: 1.98 mg/dL — ABNORMAL HIGH (ref 0.33–1.94)
Lambda Free Lght Chn: 1.84 mg/dL (ref 0.57–2.63)

## 2008-12-27 ENCOUNTER — Encounter: Admission: RE | Admit: 2008-12-27 | Discharge: 2008-12-27 | Payer: Self-pay | Admitting: Rheumatology

## 2008-12-28 ENCOUNTER — Ambulatory Visit: Payer: Self-pay | Admitting: Oncology

## 2009-01-20 LAB — CBC WITH DIFFERENTIAL/PLATELET
BASO%: 0.2 % (ref 0.0–2.0)
EOS%: 11.4 % — ABNORMAL HIGH (ref 0.0–7.0)
MCH: 30.9 pg (ref 25.1–34.0)
MCV: 92.3 fL (ref 79.5–101.0)
MONO%: 6.3 % (ref 0.0–14.0)
RBC: 3.46 10*6/uL — ABNORMAL LOW (ref 3.70–5.45)
RDW: 17.5 % — ABNORMAL HIGH (ref 11.2–14.5)
lymph#: 0.9 10*3/uL (ref 0.9–3.3)

## 2009-02-12 ENCOUNTER — Ambulatory Visit: Payer: Self-pay | Admitting: Oncology

## 2009-02-17 LAB — CBC WITH DIFFERENTIAL/PLATELET
BASO%: 0.5 % (ref 0.0–2.0)
Basophils Absolute: 0 10*3/uL (ref 0.0–0.1)
EOS%: 4.2 % (ref 0.0–7.0)
HCT: 35.6 % (ref 34.8–46.6)
HGB: 11.9 g/dL (ref 11.6–15.9)
MCH: 31.7 pg (ref 25.1–34.0)
MCHC: 33.4 g/dL (ref 31.5–36.0)
MCV: 94.8 fL (ref 79.5–101.0)
MONO%: 6.4 % (ref 0.0–14.0)
NEUT%: 80 % — ABNORMAL HIGH (ref 38.4–76.8)
RDW: 17.7 % — ABNORMAL HIGH (ref 11.2–14.5)
lymph#: 0.8 10*3/uL — ABNORMAL LOW (ref 0.9–3.3)

## 2009-03-15 ENCOUNTER — Ambulatory Visit: Payer: Self-pay | Admitting: Oncology

## 2009-03-17 LAB — CBC WITH DIFFERENTIAL/PLATELET
BASO%: 0.5 % (ref 0.0–2.0)
Basophils Absolute: 0 10*3/uL (ref 0.0–0.1)
EOS%: 6.1 % (ref 0.0–7.0)
HCT: 41.5 % (ref 34.8–46.6)
HGB: 13.6 g/dL (ref 11.6–15.9)
LYMPH%: 9.4 % — ABNORMAL LOW (ref 14.0–49.7)
MCH: 31.7 pg (ref 25.1–34.0)
MCHC: 32.8 g/dL (ref 31.5–36.0)
NEUT%: 77.7 % — ABNORMAL HIGH (ref 38.4–76.8)
Platelets: 265 10*3/uL (ref 145–400)
lymph#: 0.8 10*3/uL — ABNORMAL LOW (ref 0.9–3.3)

## 2009-04-09 ENCOUNTER — Encounter: Admission: RE | Admit: 2009-04-09 | Discharge: 2009-04-09 | Payer: Self-pay | Admitting: Orthopedic Surgery

## 2009-04-13 LAB — CBC WITH DIFFERENTIAL/PLATELET
Basophils Absolute: 0 10*3/uL (ref 0.0–0.1)
EOS%: 7.9 % — ABNORMAL HIGH (ref 0.0–7.0)
Eosinophils Absolute: 0.9 10*3/uL — ABNORMAL HIGH (ref 0.0–0.5)
HCT: 37.3 % (ref 34.8–46.6)
HGB: 12.3 g/dL (ref 11.6–15.9)
LYMPH%: 8.6 % — ABNORMAL LOW (ref 14.0–49.7)
MCH: 31.2 pg (ref 25.1–34.0)
MCV: 95 fL (ref 79.5–101.0)
MONO%: 8.7 % (ref 0.0–14.0)
NEUT#: 8.9 10*3/uL — ABNORMAL HIGH (ref 1.5–6.5)
NEUT%: 74.5 % (ref 38.4–76.8)
Platelets: 248 10*3/uL (ref 145–400)
RDW: 16.2 % — ABNORMAL HIGH (ref 11.2–14.5)

## 2009-04-14 ENCOUNTER — Ambulatory Visit (HOSPITAL_BASED_OUTPATIENT_CLINIC_OR_DEPARTMENT_OTHER): Admission: RE | Admit: 2009-04-14 | Discharge: 2009-04-14 | Payer: Self-pay | Admitting: Orthopedic Surgery

## 2009-05-10 ENCOUNTER — Ambulatory Visit: Payer: Self-pay | Admitting: Oncology

## 2009-05-12 LAB — CBC WITH DIFFERENTIAL/PLATELET
Basophils Absolute: 0 10*3/uL (ref 0.0–0.1)
Eosinophils Absolute: 0.6 10*3/uL — ABNORMAL HIGH (ref 0.0–0.5)
HCT: 35.2 % (ref 34.8–46.6)
HGB: 11.7 g/dL (ref 11.6–15.9)
MCH: 31.1 pg (ref 25.1–34.0)
MONO#: 0.7 10*3/uL (ref 0.1–0.9)
NEUT#: 8.2 10*3/uL — ABNORMAL HIGH (ref 1.5–6.5)
NEUT%: 78.7 % — ABNORMAL HIGH (ref 38.4–76.8)
RDW: 16.4 % — ABNORMAL HIGH (ref 11.2–14.5)
lymph#: 0.9 10*3/uL (ref 0.9–3.3)

## 2009-06-09 ENCOUNTER — Ambulatory Visit: Payer: Self-pay | Admitting: Oncology

## 2009-06-09 LAB — CBC WITH DIFFERENTIAL/PLATELET
BASO%: 0 % (ref 0.0–2.0)
Basophils Absolute: 0 10*3/uL (ref 0.0–0.1)
EOS%: 5.1 % (ref 0.0–7.0)
Eosinophils Absolute: 0.5 10*3/uL (ref 0.0–0.5)
HCT: 30.7 % — ABNORMAL LOW (ref 34.8–46.6)
HGB: 10.3 g/dL — ABNORMAL LOW (ref 11.6–15.9)
LYMPH%: 7.1 % — ABNORMAL LOW (ref 14.0–49.7)
MCH: 31.4 pg (ref 25.1–34.0)
MCHC: 33.4 g/dL (ref 31.5–36.0)
MCV: 93.8 fL (ref 79.5–101.0)
MONO#: 0.5 10*3/uL (ref 0.1–0.9)
MONO%: 5 % (ref 0.0–14.0)
NEUT#: 8.7 10*3/uL — ABNORMAL HIGH (ref 1.5–6.5)
NEUT%: 82.8 % — ABNORMAL HIGH (ref 38.4–76.8)
Platelets: 226 10*3/uL (ref 145–400)
RBC: 3.27 10*6/uL — ABNORMAL LOW (ref 3.70–5.45)
RDW: 17.1 % — ABNORMAL HIGH (ref 11.2–14.5)
WBC: 10.5 10*3/uL — ABNORMAL HIGH (ref 3.9–10.3)
lymph#: 0.7 10*3/uL — ABNORMAL LOW (ref 0.9–3.3)

## 2009-06-30 LAB — CBC WITH DIFFERENTIAL/PLATELET
BASO%: 0.2 % (ref 0.0–2.0)
EOS%: 3.6 % (ref 0.0–7.0)
MCH: 32.3 pg (ref 25.1–34.0)
MCHC: 32.8 g/dL (ref 31.5–36.0)
MONO#: 0.5 10*3/uL (ref 0.1–0.9)
NEUT%: 85.4 % — ABNORMAL HIGH (ref 38.4–76.8)
RBC: 3.47 10*6/uL — ABNORMAL LOW (ref 3.70–5.45)
RDW: 18.5 % — ABNORMAL HIGH (ref 11.2–14.5)
WBC: 9.4 10*3/uL (ref 3.9–10.3)
lymph#: 0.5 10*3/uL — ABNORMAL LOW (ref 0.9–3.3)

## 2009-07-01 LAB — COMPREHENSIVE METABOLIC PANEL
Albumin: 4.1 g/dL (ref 3.5–5.2)
Alkaline Phosphatase: 96 U/L (ref 39–117)
BUN: 60 mg/dL — ABNORMAL HIGH (ref 6–23)
CO2: 19 mEq/L (ref 19–32)
Glucose, Bld: 96 mg/dL (ref 70–99)
Total Bilirubin: 0.6 mg/dL (ref 0.3–1.2)

## 2009-07-01 LAB — CANCER ANTIGEN 27.29: CA 27.29: 30 U/mL (ref 0–39)

## 2009-07-01 LAB — VITAMIN D 25 HYDROXY (VIT D DEFICIENCY, FRACTURES): Vit D, 25-Hydroxy: 42 ng/mL (ref 30–89)

## 2009-07-01 LAB — LACTATE DEHYDROGENASE: LDH: 167 U/L (ref 94–250)

## 2009-08-02 ENCOUNTER — Ambulatory Visit: Payer: Self-pay | Admitting: Oncology

## 2009-08-04 LAB — CBC WITH DIFFERENTIAL/PLATELET
BASO%: 0.2 % (ref 0.0–2.0)
EOS%: 0.6 % (ref 0.0–7.0)
HCT: 38.7 % (ref 34.8–46.6)
LYMPH%: 5.8 % — ABNORMAL LOW (ref 14.0–49.7)
MCH: 32 pg (ref 25.1–34.0)
MCHC: 32.1 g/dL (ref 31.5–36.0)
MCV: 99.6 fL (ref 79.5–101.0)
MONO#: 0.5 10*3/uL (ref 0.1–0.9)
MONO%: 3.8 % (ref 0.0–14.0)
NEUT%: 89.6 % — ABNORMAL HIGH (ref 38.4–76.8)
Platelets: 264 10*3/uL (ref 145–400)
RBC: 3.88 10*6/uL (ref 3.70–5.45)
WBC: 12.7 10*3/uL — ABNORMAL HIGH (ref 3.9–10.3)

## 2009-09-01 ENCOUNTER — Ambulatory Visit: Payer: Self-pay | Admitting: Oncology

## 2009-09-01 LAB — CBC WITH DIFFERENTIAL/PLATELET
BASO%: 0.2 % (ref 0.0–2.0)
HGB: 12.6 g/dL (ref 11.6–15.9)
LYMPH%: 8.5 % — ABNORMAL LOW (ref 14.0–49.7)
MCH: 31.5 pg (ref 25.1–34.0)
MCV: 97.7 fL (ref 79.5–101.0)
MONO%: 5.5 % (ref 0.0–14.0)
Platelets: 229 10*3/uL (ref 145–400)
RBC: 3.99 10*6/uL (ref 3.70–5.45)
RDW: 16.6 % — ABNORMAL HIGH (ref 11.2–14.5)
WBC: 10.9 10*3/uL — ABNORMAL HIGH (ref 3.9–10.3)

## 2009-09-29 LAB — CBC WITH DIFFERENTIAL/PLATELET
BASO%: 0.4 % (ref 0.0–2.0)
Basophils Absolute: 0 10*3/uL (ref 0.0–0.1)
HCT: 36.2 % (ref 34.8–46.6)
LYMPH%: 8.8 % — ABNORMAL LOW (ref 14.0–49.7)
Platelets: 208 10*3/uL (ref 145–400)
RDW: 16.8 % — ABNORMAL HIGH (ref 11.2–14.5)
WBC: 10.9 10*3/uL — ABNORMAL HIGH (ref 3.9–10.3)
lymph#: 1 10*3/uL (ref 0.9–3.3)

## 2009-10-26 ENCOUNTER — Ambulatory Visit: Payer: Self-pay | Admitting: Oncology

## 2009-10-27 LAB — CBC WITH DIFFERENTIAL/PLATELET
Basophils Absolute: 0 10*3/uL (ref 0.0–0.1)
EOS%: 6.2 % (ref 0.0–7.0)
Eosinophils Absolute: 0.7 10*3/uL — ABNORMAL HIGH (ref 0.0–0.5)
HCT: 31.3 % — ABNORMAL LOW (ref 34.8–46.6)
MCHC: 33.2 g/dL (ref 31.5–36.0)
MONO%: 6.3 % (ref 0.0–14.0)
NEUT%: 79.7 % — ABNORMAL HIGH (ref 38.4–76.8)
Platelets: 206 10*3/uL (ref 145–400)
RBC: 3.25 10*6/uL — ABNORMAL LOW (ref 3.70–5.45)
lymph#: 0.9 10*3/uL (ref 0.9–3.3)

## 2009-11-23 LAB — CBC WITH DIFFERENTIAL/PLATELET
BASO%: 0.2 % (ref 0.0–2.0)
Basophils Absolute: 0 10*3/uL (ref 0.0–0.1)
LYMPH%: 6.4 % — ABNORMAL LOW (ref 14.0–49.7)
MCH: 31.6 pg (ref 25.1–34.0)
NEUT#: 9.2 10*3/uL — ABNORMAL HIGH (ref 1.5–6.5)
NEUT%: 83.7 % — ABNORMAL HIGH (ref 38.4–76.8)
RDW: 17.9 % — ABNORMAL HIGH (ref 11.2–14.5)
WBC: 10.9 10*3/uL — ABNORMAL HIGH (ref 3.9–10.3)

## 2009-12-21 ENCOUNTER — Ambulatory Visit: Payer: Self-pay | Admitting: Oncology

## 2009-12-22 LAB — CBC WITH DIFFERENTIAL/PLATELET
Eosinophils Absolute: 0.5 10*3/uL (ref 0.0–0.5)
HCT: 32.5 % — ABNORMAL LOW (ref 34.8–46.6)
LYMPH%: 8.6 % — ABNORMAL LOW (ref 14.0–49.7)
MCH: 31.3 pg (ref 25.1–34.0)
MONO%: 5.9 % (ref 0.0–14.0)
NEUT%: 79.8 % — ABNORMAL HIGH (ref 38.4–76.8)
WBC: 10.1 10*3/uL (ref 3.9–10.3)
lymph#: 0.9 10*3/uL (ref 0.9–3.3)

## 2009-12-28 ENCOUNTER — Encounter: Admission: RE | Admit: 2009-12-28 | Discharge: 2009-12-28 | Payer: Self-pay | Admitting: Internal Medicine

## 2009-12-29 LAB — CBC WITH DIFFERENTIAL/PLATELET
BASO%: 0 % (ref 0.0–2.0)
Basophils Absolute: 0 10*3/uL (ref 0.0–0.1)
Eosinophils Absolute: 0.3 10*3/uL (ref 0.0–0.5)
MCHC: 32.5 g/dL (ref 31.5–36.0)
MCV: 95.2 fL (ref 79.5–101.0)
MONO#: 1.1 10*3/uL — ABNORMAL HIGH (ref 0.1–0.9)
MONO%: 7 % (ref 0.0–14.0)
Platelets: 321 10*3/uL (ref 145–400)
RBC: 3.53 10*6/uL — ABNORMAL LOW (ref 3.70–5.45)
RDW: 18.8 % — ABNORMAL HIGH (ref 11.2–14.5)
lymph#: 0.6 10*3/uL — ABNORMAL LOW (ref 0.9–3.3)

## 2009-12-30 LAB — COMPREHENSIVE METABOLIC PANEL
ALT: 11 U/L (ref 0–35)
BUN: 43 mg/dL — ABNORMAL HIGH (ref 6–23)
Creatinine, Ser: 2.41 mg/dL — ABNORMAL HIGH (ref 0.40–1.20)
Sodium: 139 mEq/L (ref 135–145)

## 2009-12-30 LAB — VITAMIN D 25 HYDROXY (VIT D DEFICIENCY, FRACTURES): Vit D, 25-Hydroxy: 49 ng/mL (ref 30–89)

## 2009-12-30 LAB — LACTATE DEHYDROGENASE: LDH: 175 U/L (ref 94–250)

## 2010-01-19 LAB — CBC WITH DIFFERENTIAL/PLATELET
BASO%: 0.1 % (ref 0.0–2.0)
Basophils Absolute: 0 10*3/uL (ref 0.0–0.1)
EOS%: 9 % — ABNORMAL HIGH (ref 0.0–7.0)
Eosinophils Absolute: 0.7 10*3/uL — ABNORMAL HIGH (ref 0.0–0.5)
HGB: 11.3 g/dL — ABNORMAL LOW (ref 11.6–15.9)
MCHC: 32.4 g/dL (ref 31.5–36.0)
MCV: 91.9 fL (ref 79.5–101.0)
MONO#: 0.6 10*3/uL (ref 0.1–0.9)
MONO%: 8 % (ref 0.0–14.0)
NEUT#: 5.3 10*3/uL (ref 1.5–6.5)
Platelets: 259 10*3/uL (ref 145–400)
RBC: 3.8 10*6/uL (ref 3.70–5.45)
RDW: 19.5 % — ABNORMAL HIGH (ref 11.2–14.5)
lymph#: 0.9 10*3/uL (ref 0.9–3.3)

## 2010-02-14 ENCOUNTER — Ambulatory Visit: Payer: Self-pay | Admitting: Oncology

## 2010-02-16 LAB — CBC WITH DIFFERENTIAL/PLATELET
BASO%: 0.4 % (ref 0.0–2.0)
Basophils Absolute: 0 10*3/uL (ref 0.0–0.1)
MCH: 30 pg (ref 25.1–34.0)
MCHC: 32.9 g/dL (ref 31.5–36.0)
MCV: 90.9 fL (ref 79.5–101.0)
NEUT#: 7.3 10*3/uL — ABNORMAL HIGH (ref 1.5–6.5)
NEUT%: 80.2 % — ABNORMAL HIGH (ref 38.4–76.8)
Platelets: 234 10*3/uL (ref 145–400)
RBC: 4.32 10*6/uL (ref 3.70–5.45)
WBC: 9.2 10*3/uL (ref 3.9–10.3)
lymph#: 0.8 10*3/uL — ABNORMAL LOW (ref 0.9–3.3)

## 2010-03-16 ENCOUNTER — Ambulatory Visit: Payer: Self-pay | Admitting: Oncology

## 2010-03-16 LAB — CBC WITH DIFFERENTIAL/PLATELET
EOS%: 3.9 % (ref 0.0–7.0)
Eosinophils Absolute: 0.4 10*3/uL (ref 0.0–0.5)
HGB: 10.6 g/dL — ABNORMAL LOW (ref 11.6–15.9)
MCV: 90.8 fL (ref 79.5–101.0)
MONO#: 0.4 10*3/uL (ref 0.1–0.9)
MONO%: 3.2 % (ref 0.0–14.0)
NEUT#: 9.7 10*3/uL — ABNORMAL HIGH (ref 1.5–6.5)
NEUT%: 85.8 % — ABNORMAL HIGH (ref 38.4–76.8)

## 2010-04-13 LAB — CBC WITH DIFFERENTIAL/PLATELET
BASO%: 0.3 % (ref 0.0–2.0)
Basophils Absolute: 0 10*3/uL (ref 0.0–0.1)
EOS%: 6.4 % (ref 0.0–7.0)
Eosinophils Absolute: 0.7 10*3/uL — ABNORMAL HIGH (ref 0.0–0.5)
HCT: 34.8 % (ref 34.8–46.6)
HGB: 11.4 g/dL — ABNORMAL LOW (ref 11.6–15.9)
MONO#: 0.6 10*3/uL (ref 0.1–0.9)
NEUT#: 8.4 10*3/uL — ABNORMAL HIGH (ref 1.5–6.5)
RBC: 3.8 10*6/uL (ref 3.70–5.45)
RDW: 19.6 % — ABNORMAL HIGH (ref 11.2–14.5)
lymph#: 0.7 10*3/uL — ABNORMAL LOW (ref 0.9–3.3)

## 2010-05-09 ENCOUNTER — Ambulatory Visit: Payer: Self-pay | Admitting: Oncology

## 2010-06-08 ENCOUNTER — Ambulatory Visit: Payer: Self-pay | Admitting: Oncology

## 2010-06-08 LAB — CBC WITH DIFFERENTIAL/PLATELET
BASO%: 0.1 % (ref 0.0–2.0)
Basophils Absolute: 0 10*3/uL (ref 0.0–0.1)
EOS%: 0.1 % (ref 0.0–7.0)
HGB: 12.8 g/dL (ref 11.6–15.9)
LYMPH%: 4.3 % — ABNORMAL LOW (ref 14.0–49.7)
MCH: 29.8 pg (ref 25.1–34.0)
MCV: 92.7 fL (ref 79.5–101.0)
MONO#: 0.3 10*3/uL (ref 0.1–0.9)
NEUT%: 92.8 % — ABNORMAL HIGH (ref 38.4–76.8)

## 2010-07-06 LAB — CBC & DIFF AND RETIC
Basophils Absolute: 0 10*3/uL (ref 0.0–0.1)
HCT: 35.4 % (ref 34.8–46.6)
Immature Retic Fract: 4.1 % (ref 0.00–10.70)
MCHC: 32.2 g/dL (ref 31.5–36.0)
MCV: 91.7 fL (ref 79.5–101.0)
MONO#: 0.5 10*3/uL (ref 0.1–0.9)
MONO%: 6.8 % (ref 0.0–14.0)
NEUT#: 5.6 10*3/uL (ref 1.5–6.5)
NEUT%: 76.4 % (ref 38.4–76.8)
Platelets: 202 10*3/uL (ref 145–400)
RBC: 3.86 10*6/uL (ref 3.70–5.45)
WBC: 7.4 10*3/uL (ref 3.9–10.3)
lymph#: 0.8 10*3/uL — ABNORMAL LOW (ref 0.9–3.3)

## 2010-07-06 LAB — MORPHOLOGY

## 2010-07-07 LAB — IRON AND TIBC
%SAT: 23 % (ref 20–55)
UIBC: 196 ug/dL

## 2010-07-07 LAB — COMPREHENSIVE METABOLIC PANEL
ALT: 16 U/L (ref 0–35)
AST: 23 U/L (ref 0–37)
Albumin: 3.8 g/dL (ref 3.5–5.2)
Alkaline Phosphatase: 110 U/L (ref 39–117)
Calcium: 9.1 mg/dL (ref 8.4–10.5)
Glucose, Bld: 80 mg/dL (ref 70–99)
Total Protein: 5.9 g/dL — ABNORMAL LOW (ref 6.0–8.3)

## 2010-07-07 LAB — VITAMIN D 25 HYDROXY (VIT D DEFICIENCY, FRACTURES): Vit D, 25-Hydroxy: 43 ng/mL (ref 30–89)

## 2010-07-11 ENCOUNTER — Ambulatory Visit: Payer: Self-pay | Admitting: Oncology

## 2010-08-03 LAB — CBC WITH DIFFERENTIAL/PLATELET
EOS%: 7.3 % — ABNORMAL HIGH (ref 0.0–7.0)
Eosinophils Absolute: 0.8 10*3/uL — ABNORMAL HIGH (ref 0.0–0.5)
HCT: 33.6 % — ABNORMAL LOW (ref 34.8–46.6)
HGB: 10.7 g/dL — ABNORMAL LOW (ref 11.6–15.9)
LYMPH%: 9.5 % — ABNORMAL LOW (ref 14.0–49.7)
MCH: 28.9 pg (ref 25.1–34.0)
MONO%: 8.4 % (ref 0.0–14.0)
NEUT#: 8.4 10*3/uL — ABNORMAL HIGH (ref 1.5–6.5)
NEUT%: 74.5 % (ref 38.4–76.8)
RBC: 3.7 10*6/uL (ref 3.70–5.45)
RDW: 18.6 % — ABNORMAL HIGH (ref 11.2–14.5)
WBC: 11.2 10*3/uL — ABNORMAL HIGH (ref 3.9–10.3)

## 2010-08-25 ENCOUNTER — Ambulatory Visit (HOSPITAL_BASED_OUTPATIENT_CLINIC_OR_DEPARTMENT_OTHER): Payer: Medicare Other | Admitting: Oncology

## 2010-08-31 LAB — CBC WITH DIFFERENTIAL/PLATELET
BASO%: 0.1 % (ref 0.0–2.0)
Basophils Absolute: 0 10*3/uL (ref 0.0–0.1)
EOS%: 0.8 % (ref 0.0–7.0)
Eosinophils Absolute: 0.1 10*3/uL (ref 0.0–0.5)
HCT: 30.7 % — ABNORMAL LOW (ref 34.8–46.6)
HGB: 10 g/dL — ABNORMAL LOW (ref 11.6–15.9)
LYMPH%: 5.7 % — ABNORMAL LOW (ref 14.0–49.7)
MCH: 30.1 pg (ref 25.1–34.0)
MCHC: 32.5 g/dL (ref 31.5–36.0)
MCV: 92.8 fL (ref 79.5–101.0)
MONO#: 0.4 10*3/uL (ref 0.1–0.9)
MONO%: 3.6 % (ref 0.0–14.0)
NEUT#: 9.3 10*3/uL — ABNORMAL HIGH (ref 1.5–6.5)
NEUT%: 89.8 % — ABNORMAL HIGH (ref 38.4–76.8)
Platelets: 292 10*3/uL (ref 145–400)
RBC: 3.31 10*6/uL — ABNORMAL LOW (ref 3.70–5.45)
RDW: 20.9 % — ABNORMAL HIGH (ref 11.2–14.5)
WBC: 10.4 10*3/uL — ABNORMAL HIGH (ref 3.9–10.3)
lymph#: 0.6 10*3/uL — ABNORMAL LOW (ref 0.9–3.3)

## 2010-09-28 ENCOUNTER — Ambulatory Visit (HOSPITAL_BASED_OUTPATIENT_CLINIC_OR_DEPARTMENT_OTHER): Payer: Medicare Other | Admitting: Oncology

## 2010-09-28 DIAGNOSIS — Z853 Personal history of malignant neoplasm of breast: Secondary | ICD-10-CM

## 2010-09-28 DIAGNOSIS — N289 Disorder of kidney and ureter, unspecified: Secondary | ICD-10-CM

## 2010-09-28 LAB — CBC WITH DIFFERENTIAL/PLATELET
BASO%: 0.1 % (ref 0.0–2.0)
Basophils Absolute: 0 10*3/uL (ref 0.0–0.1)
LYMPH%: 5.6 % — ABNORMAL LOW (ref 14.0–49.7)
MCV: 94.6 fL (ref 79.5–101.0)
MONO%: 4.6 % (ref 0.0–14.0)
NEUT#: 6.9 10*3/uL — ABNORMAL HIGH (ref 1.5–6.5)
RBC: 3.57 10*6/uL — ABNORMAL LOW (ref 3.70–5.45)
RDW: 21.6 % — ABNORMAL HIGH (ref 11.2–14.5)
WBC: 8 10*3/uL (ref 3.9–10.3)
lymph#: 0.4 10*3/uL — ABNORMAL LOW (ref 0.9–3.3)

## 2010-09-30 ENCOUNTER — Ambulatory Visit: Payer: Medicare Other | Admitting: Oncology

## 2010-09-30 DIAGNOSIS — Z853 Personal history of malignant neoplasm of breast: Secondary | ICD-10-CM

## 2010-09-30 DIAGNOSIS — D649 Anemia, unspecified: Secondary | ICD-10-CM

## 2010-10-26 ENCOUNTER — Encounter (HOSPITAL_BASED_OUTPATIENT_CLINIC_OR_DEPARTMENT_OTHER): Payer: Medicare Other | Admitting: Oncology

## 2010-10-26 ENCOUNTER — Other Ambulatory Visit: Payer: Self-pay | Admitting: Oncology

## 2010-10-26 DIAGNOSIS — D649 Anemia, unspecified: Secondary | ICD-10-CM

## 2010-10-26 DIAGNOSIS — Z853 Personal history of malignant neoplasm of breast: Secondary | ICD-10-CM

## 2010-10-26 DIAGNOSIS — N289 Disorder of kidney and ureter, unspecified: Secondary | ICD-10-CM

## 2010-10-26 LAB — CBC WITH DIFFERENTIAL/PLATELET
BASO%: 0.5 % (ref 0.0–2.0)
HGB: 12 g/dL (ref 11.6–15.9)
LYMPH%: 5.5 % — ABNORMAL LOW (ref 14.0–49.7)
Platelets: 189 10*3/uL (ref 145–400)
RBC: 4.05 10*6/uL (ref 3.70–5.45)
WBC: 11.5 10*3/uL — ABNORMAL HIGH (ref 3.9–10.3)
nRBC: 0 % (ref 0–0)

## 2010-11-24 ENCOUNTER — Other Ambulatory Visit: Payer: Self-pay | Admitting: Oncology

## 2010-11-24 ENCOUNTER — Encounter (HOSPITAL_BASED_OUTPATIENT_CLINIC_OR_DEPARTMENT_OTHER): Payer: Medicare Other | Admitting: Oncology

## 2010-11-24 DIAGNOSIS — Z853 Personal history of malignant neoplasm of breast: Secondary | ICD-10-CM

## 2010-11-24 LAB — CBC WITH DIFFERENTIAL/PLATELET
BASO%: 0.3 % (ref 0.0–2.0)
Basophils Absolute: 0 10*3/uL (ref 0.0–0.1)
EOS%: 2.3 % (ref 0.0–7.0)
Eosinophils Absolute: 0.3 10*3/uL (ref 0.0–0.5)
LYMPH%: 5.6 % — ABNORMAL LOW (ref 14.0–49.7)
MONO#: 0.5 10*3/uL (ref 0.1–0.9)
Platelets: 219 10*3/uL (ref 145–400)
RDW: 17.9 % — ABNORMAL HIGH (ref 11.2–14.5)
lymph#: 0.7 10*3/uL — ABNORMAL LOW (ref 0.9–3.3)

## 2010-12-03 LAB — BASIC METABOLIC PANEL
BUN: 54 mg/dL — ABNORMAL HIGH (ref 6–23)
CO2: 26 mEq/L (ref 19–32)
Calcium: 9.2 mg/dL (ref 8.4–10.5)
Chloride: 109 mEq/L (ref 96–112)
Creatinine, Ser: 2.42 mg/dL — ABNORMAL HIGH (ref 0.4–1.2)
GFR calc Af Amer: 23 mL/min — ABNORMAL LOW (ref >60)
GFR calc non Af Amer: 19 mL/min — ABNORMAL LOW (ref >60)
Glucose, Bld: 74 mg/dL (ref 70–99)
Potassium: 5.1 mEq/L (ref 3.5–5.1)
Sodium: 144 mEq/L (ref 135–145)

## 2010-12-03 LAB — POCT I-STAT, CHEM 8
BUN: 52 mg/dL — ABNORMAL HIGH (ref 6–23)
Calcium, Ion: 1.22 mmol/L (ref 1.12–1.32)
Glucose, Bld: 88 mg/dL (ref 70–99)
Hemoglobin: 14.6 g/dL (ref 12.0–15.0)
Potassium: 4.3 mEq/L (ref 3.5–5.1)
Sodium: 144 mEq/L (ref 135–145)

## 2010-12-21 ENCOUNTER — Other Ambulatory Visit: Payer: Self-pay | Admitting: Oncology

## 2010-12-21 ENCOUNTER — Encounter (HOSPITAL_BASED_OUTPATIENT_CLINIC_OR_DEPARTMENT_OTHER): Payer: Medicare Other | Admitting: Oncology

## 2010-12-21 DIAGNOSIS — Z853 Personal history of malignant neoplasm of breast: Secondary | ICD-10-CM

## 2010-12-21 LAB — CBC WITH DIFFERENTIAL/PLATELET
Eosinophils Absolute: 0.4 10*3/uL (ref 0.0–0.5)
HGB: 11.3 g/dL — ABNORMAL LOW (ref 11.6–15.9)
MCH: 30.2 pg (ref 25.1–34.0)
MONO%: 6.2 % (ref 0.0–14.0)
NEUT#: 10.6 10*3/uL — ABNORMAL HIGH (ref 1.5–6.5)
NEUT%: 85.2 % — ABNORMAL HIGH (ref 38.4–76.8)
Platelets: 168 10*3/uL (ref 145–400)
RDW: 19.3 % — ABNORMAL HIGH (ref 11.2–14.5)

## 2011-01-05 ENCOUNTER — Ambulatory Visit (INDEPENDENT_AMBULATORY_CARE_PROVIDER_SITE_OTHER): Payer: Medicare Other | Admitting: Vascular Surgery

## 2011-01-05 ENCOUNTER — Encounter (INDEPENDENT_AMBULATORY_CARE_PROVIDER_SITE_OTHER): Payer: Medicare Other

## 2011-01-05 DIAGNOSIS — Z01810 Encounter for preprocedural cardiovascular examination: Secondary | ICD-10-CM

## 2011-01-05 DIAGNOSIS — N186 End stage renal disease: Secondary | ICD-10-CM

## 2011-01-05 DIAGNOSIS — N184 Chronic kidney disease, stage 4 (severe): Secondary | ICD-10-CM

## 2011-01-06 NOTE — Assessment & Plan Note (Signed)
OFFICE VISIT  SCARLETH, BRAME L DOB:  17-Feb-1922                                       01/05/2011 ZOXWR#:[id number]  CHIEF COMPLAINT:  Needs hemodialysis access.  HISTORY OF PRESENT ILLNESS:  The patient is an 77 year old female referred by Dr. Caryn Section for placement of a new dialysis access.  The patient currently is not on dialysis.  Primary risk factor for renal failure is hypertension.  She is right-handed.  Other chronic medical problems include hypertension, gout, elevated cholesterol.  These are currently controlled and followed by Dr. Earl Gala and Dr. Caryn Section.  PAST SURGICAL HISTORY:  Right breast lumpectomy for DCIS, parathyroidectomy for adenoma, cataract removal, right knee operation.  SOCIAL HISTORY:  She is married.  She has four children.  She is a nonsmoker, non-consumer of alcohol.  FAMILY HISTORY:  She has no history of early vascular disease at age less than 61.  REVIEW OF SYSTEMS:  A full 12 point review of systems was performed with the patient today.  Please see intake referral form for details regarding this.  MEDICATIONS:  Labetalol, Diovan, pantoprazole, prednisone, allopurinol, Os-Cal, glucosamine, Centrum Silver, Slow Fe, calcitriol, Lipitor, amlodipine, aspirin, Aranesp, Tylenol.  ALLERGIES:  She has no known drug allergies.  PHYSICAL EXAMINATION:  Blood pressure is 150/63 in the left arm, heart rate is 45 and regular, oxygen saturation is 97% on room air.  HEENT: Unremarkable.  Neck:  2+ carotid pulses without bruit.  Chest:  Clear to auscultation.  Cardiac:  Regular rate and rhythm without murmur. Abdomen:  Soft, nontender, nondistended.  No masses.  Musculoskeletal: Symmetric upper extremities and lower extremities with no significant major deformities or edema.  Neurologic:  Symmetric upper extremity and lower extremity motor strength which is 5/5.  Skin:  Multiple areas of ecchymosis in the forearm.  Otherwise there are no  ulcers or rashes. Peripheral vascular:  She has 2+ brachial and radial pulses bilaterally.  She had a vein mapping ultrasound today which I reviewed and interpreted.  The cephalic vein is too small to be usable for a fistula bilaterally.  The basilic vein is also too small to be used for a fistula bilaterally.  In summary, the patient does not have adequate vein for creation of a fistula.  She is currently not on dialysis.  I discussed with her today the possibility of placement of a left arm AV graft, which would most likely be an upper arm graft due to the small size of her veins.  I discussed with the patient and her husband today risks, benefits and possible complications of that procedure including but not limited to ischemic steal, graft thrombosis, infection.  I quoted them a graft thrombosis rate of 25% at 1 year.  They wish currently to discuss their options with their son, who apparently is an emergency room physician in [place].  They will call back to schedule placement of a left arm AV graft in the near future.    Janetta Hora. Maridel Pixler, MD Electronically Signed  CEF/MEDQ  D:  01/05/2011  T:  01/06/2011  Job:  [id number]  cc:   Theressa Millard, M.D. Richard F. Caryn Section, M.D.

## 2011-01-10 NOTE — Procedures (Signed)
DUPLEX DEEP VENOUS EXAM - LOWER EXTREMITY   INDICATION:  Bilateral calf pain.   HISTORY:  Edema:  No.  Trauma/Surgery:  No.  Pain:  Four days of bilateral calf pain.  PE:  No.  Previous DVT:  No.  Anticoagulants:  No.  Other:   DUPLEX EXAM:                CFV   SFV   PopV  PTV    GSV                R  L  R  L  R  L  R   L  R  L  Thrombosis    0  0  0  0  0  0  0   0  +  +  Spontaneous   +  +  +  +  +  +  +   +  +  +  Phasic        +  +  +  +  +  +  +   +  +  +  Augmentation  +  +  +  +  +  +  +   +  +  +  Compressible  +  +  +  +  +  +  +   +  +  +  Competent     0  +  +  +  +  +  +   +  0  P   Legend:  + - yes  o - no  p - partial  D - decreased   IMPRESSION:  1. No evidence of deep or superficial vein thrombus bilaterally or      Baker's cyst bilaterally.  2. Right common femoral vein and greater saphenous vein are      incompetent.  3. Distal left greater saphenous vein (in the calf) is incompetent.    _____________________________  V. Charlena Cross, MD   MC/MEDQ  D:  06/18/2008  T:  06/19/2008  Job:  [id number]

## 2011-01-10 NOTE — Op Note (Signed)
[name], [name]                  ACCOUNT NO.:  [id number]   MEDICAL RECORD NO.:  [id number]          PATIENT TYPE:  AMB   LOCATION:  [facility]                         FACILITY:  [facility]   PHYSICIAN:  Ollen Gross, M.D.    DATE OF BIRTH:  1922/03/10   DATE OF PROCEDURE:  04/14/2009  DATE OF DISCHARGE:  04/14/2009                               OPERATIVE REPORT   PREOPERATIVE DIAGNOSIS:  Right knee medial meniscal tear.   POSTOPERATIVE DIAGNOSIS:  Right knee medial meniscal tear plus lateral  meniscal tear.   PROCEDURE:  Right knee arthroscopy with medial and lateral meniscal  debridement.   SURGEON:  Ollen Gross, M.D.   No assistant.   ANESTHESIA:  General.   BLOOD LOSS:  Minimal.   DRAIN:  None.   COMPLICATIONS:  None.   CONDITION:  Stable to recovery   BRIEF CLINICAL NOTE:  Miranda [name] is an 77 year old female with several  month history of significant right knee pain and mechanical symptoms.  Family history suggested a meniscal tear.  She had an MRI scan showing a  large stress reaction medially as well as a medial meniscal tear.  She  gave an adequate time for the stress reaction to improve but she  continued with pain and mechanical symptoms.  She presents now for  arthroscopic debridement.   PROCEDURE IN DETAIL:  After successful administration of general  anesthetic a tourniquet was placed high on the right thigh and right  lower extremity prepped and draped in the usual sterile fashion.  Standard superomedial and inferolateral incisions were made.  Inflow  cannula passed, superomedial camera passed inferolateral.  Arthroscopic  visualization proceeds.  Undersurface of the patella and trochlea shows  some chondromalacia but no full-thickness cartilage defects.  She did  have some grade 3 changes on the patella.  There were a lot of  crystals  scattered throughout her joint consistent with her diagnosis of gout.  Medial and lateral gutters were visualized and there  were no loose  bodies.  Flexion valgus forces applied to the knee and the medial  compartment was entered.  She had a significant tear in the body and  posterior horn of the medial meniscus.  There was also some  chondromalacia on the medial femoral condyle grade 2 and small areas of  grade 3 without full-thickness defects.  Spinal needle was used to  localize the localize the inferomedial portal, small incision made,  dilator placed and then the meniscus debrided back to stable base with  baskets and a 4.2 mm shaver.  It is then sealed with the ArthroCare  device.  The rest of the medial compartment shows no loose bodies or  tears.  Intercondylar notch was visualized.  The ACL was normal.  The  lateral compartment was entered and there was a significant degenerative  tear in the body and posterior horn of the lateral meniscus.  This was  debrided back to stable base with baskets and a 4.2 shaver and sealed  off with the ArthroCare device.  There is a fair amount of synovitis  present  in the medial and lateral gutters and debrided out with the  ArthroCare.  The suprapatellar synovitis was also debrided.  The joint  was again inspected.  No other tears or loose bodies noted.  The  arthroscopic equipment was then removed from the inferior portals which  were closed with interrupted 4-0 nylon.  Marcaine 20 mL 4% with epi  injected through the inflow cannula and then that is removed and that  portal closed with nylon.  Bulky sterile dressing applied and she is  awakened and transferred to recovery in stable condition.      Ollen Gross, M.D.  Electronically Signed     FA/MEDQ  D:  04/16/2009  T:  04/16/2009  Job:  [id number]

## 2011-01-11 ENCOUNTER — Other Ambulatory Visit: Payer: Self-pay | Admitting: Oncology

## 2011-01-11 ENCOUNTER — Encounter (HOSPITAL_BASED_OUTPATIENT_CLINIC_OR_DEPARTMENT_OTHER): Payer: Medicare Other | Admitting: Oncology

## 2011-01-11 DIAGNOSIS — D649 Anemia, unspecified: Secondary | ICD-10-CM

## 2011-01-11 DIAGNOSIS — N289 Disorder of kidney and ureter, unspecified: Secondary | ICD-10-CM

## 2011-01-11 DIAGNOSIS — Z853 Personal history of malignant neoplasm of breast: Secondary | ICD-10-CM

## 2011-01-11 LAB — CBC & DIFF AND RETIC
BASO%: 0.3 % (ref 0.0–2.0)
Basophils Absolute: 0 10*3/uL (ref 0.0–0.1)
EOS%: 4.6 % (ref 0.0–7.0)
Eosinophils Absolute: 0.4 10*3/uL (ref 0.0–0.5)
HCT: 33.1 % — ABNORMAL LOW (ref 34.8–46.6)
HGB: 10.6 g/dL — ABNORMAL LOW (ref 11.6–15.9)
Immature Retic Fract: 1.5 % (ref 0.00–10.70)
LYMPH%: 15 % (ref 14.0–49.7)
MCH: 29.2 pg (ref 25.1–34.0)
MCHC: 32 g/dL (ref 31.5–36.0)
MCV: 91.2 fL (ref 79.5–101.0)
MONO#: 0.7 10*3/uL (ref 0.1–0.9)
MONO%: 7.4 % (ref 0.0–14.0)
NEUT#: 6.8 10*3/uL — ABNORMAL HIGH (ref 1.5–6.5)
NEUT%: 72.7 % (ref 38.4–76.8)
Platelets: 176 10*3/uL (ref 145–400)
RBC: 3.63 10*6/uL — ABNORMAL LOW (ref 3.70–5.45)
RDW: 17.5 % — ABNORMAL HIGH (ref 11.2–14.5)
Retic %: 1.09 % (ref 0.50–1.50)
Retic Ct Abs: 39.57 10*3/uL (ref 18.30–72.70)
WBC: 9.4 10*3/uL (ref 3.9–10.3)
lymph#: 1.4 10*3/uL (ref 0.9–3.3)

## 2011-01-13 LAB — COMPREHENSIVE METABOLIC PANEL
AST: 30 U/L (ref 0–37)
BUN: 94 mg/dL — ABNORMAL HIGH (ref 6–23)
CO2: 15 mEq/L — ABNORMAL LOW (ref 19–32)
Chloride: 109 mEq/L (ref 96–112)
Glucose, Bld: 92 mg/dL (ref 70–99)
Potassium: 4.8 mEq/L (ref 3.5–5.3)
Sodium: 139 mEq/L (ref 135–145)
Total Bilirubin: 0.3 mg/dL (ref 0.3–1.2)

## 2011-01-13 LAB — IRON AND TIBC
%SAT: 47 % (ref 20–55)
Iron: 107 ug/dL (ref 42–145)
UIBC: 121 ug/dL

## 2011-01-13 LAB — PROTEIN ELECTROPHORESIS, SERUM
Alpha-2-Globulin: 13.2 % — ABNORMAL HIGH (ref 7.1–11.8)
Beta 2: 4.8 % (ref 3.2–6.5)
Gamma Globulin: 10 % — ABNORMAL LOW (ref 11.1–18.8)

## 2011-01-13 LAB — FERRITIN: Ferritin: 832 ng/mL — ABNORMAL HIGH (ref 10–291)

## 2011-01-13 NOTE — Discharge Summary (Signed)
Miranda [name], Miranda [name]                  ACCOUNT NO.:  [id number]   MEDICAL RECORD NO.:  [id number]          PATIENT TYPE:  OIB   LOCATION:  5524                         FACILITY:  [facility]   PHYSICIAN:  Wilber Bihari. Caryn Section, M.D.   DATE OF BIRTH:  01-24-1922   DATE OF ADMISSION:  03/01/2005  DATE OF DISCHARGE:  03/02/2005                                 DISCHARGE SUMMARY   Miranda [name] is a very nice 77 year old white woman admitted for renal biopsy.  She had declining renal function, and large kidneys were seen on ultrasound.  Serologic evaluation was negative.  Serum creatinine was 2.01 August 2004,  2.2 on June 12.  Aspirin was discontinued due to prolonged bleeding time.  She was admitted on July 5 for renal biopsy and ultrasound.  Two cores were  obtained.  Hemoglobin was 11.1 following the biopsy at 4 p.m., 11.4 on the  day of discharge.  She is discharged home in good condition.   FINAL DIAGNOSIS:  Chronic kidney disease, question etiology (to be  determined by results of renal biopsy).   She will follow up with me in the office on March 22, 2005.       RFF/MEDQ  D:  03/02/2005  T:  03/02/2005  Job:  [id number]

## 2011-01-14 NOTE — Procedures (Unsigned)
CEPHALIC VEIN MAPPING  INDICATION:  Preop vein mapping for dialysis access.  HISTORY: Chronic kidney disease, stage 4.  EXAM: The right cephalic vein is compressible.  Diameter measurements range from 0.32 to 0.14 cm.  The right basilic vein is compressible.  Diameter measurement is 0.15 cm in the mid upper arm and too small to be evaluated distally.  The left cephalic vein is compressible.  Diameter measurements range from 0.27 to 0.17 cm.  The left basilic vein is compressible.  Diameter measurements range from 0.27 to 0.13 cm.  See attached worksheet for all measurements.  IMPRESSION:  Patent bilateral cephalic and basilic veins with diameter measurements as described above.  ___________________________________________ Janetta Hora. Fields, MD  LT/MEDQ  D:  01/05/2011  T:  01/05/2011  Job:  [id number]

## 2011-01-18 ENCOUNTER — Other Ambulatory Visit (HOSPITAL_COMMUNITY): Payer: Medicare Other

## 2011-01-19 ENCOUNTER — Encounter (HOSPITAL_BASED_OUTPATIENT_CLINIC_OR_DEPARTMENT_OTHER): Payer: Medicare Other | Admitting: Oncology

## 2011-01-19 DIAGNOSIS — Z853 Personal history of malignant neoplasm of breast: Secondary | ICD-10-CM

## 2011-01-19 DIAGNOSIS — D649 Anemia, unspecified: Secondary | ICD-10-CM

## 2011-01-19 DIAGNOSIS — N289 Disorder of kidney and ureter, unspecified: Secondary | ICD-10-CM

## 2011-01-24 ENCOUNTER — Ambulatory Visit (HOSPITAL_COMMUNITY): Admission: RE | Admit: 2011-01-24 | Payer: Medicare Other | Source: Ambulatory Visit | Admitting: Vascular Surgery

## 2011-02-16 ENCOUNTER — Encounter (HOSPITAL_BASED_OUTPATIENT_CLINIC_OR_DEPARTMENT_OTHER): Payer: Medicare Other | Admitting: Oncology

## 2011-02-16 ENCOUNTER — Other Ambulatory Visit: Payer: Self-pay | Admitting: Oncology

## 2011-02-16 DIAGNOSIS — Z853 Personal history of malignant neoplasm of breast: Secondary | ICD-10-CM

## 2011-02-16 LAB — CBC WITH DIFFERENTIAL/PLATELET
BASO%: 0.3 % (ref 0.0–2.0)
EOS%: 1.7 % (ref 0.0–7.0)
LYMPH%: 7 % — ABNORMAL LOW (ref 14.0–49.7)
MCH: 31.1 pg (ref 25.1–34.0)
MCHC: 31.9 g/dL (ref 31.5–36.0)
MONO#: 0.5 10*3/uL (ref 0.1–0.9)
Platelets: 213 10*3/uL (ref 145–400)
RBC: 3.79 10*6/uL (ref 3.70–5.45)
WBC: 9 10*3/uL (ref 3.9–10.3)

## 2011-03-16 ENCOUNTER — Other Ambulatory Visit: Payer: Self-pay | Admitting: Oncology

## 2011-03-16 ENCOUNTER — Encounter (HOSPITAL_BASED_OUTPATIENT_CLINIC_OR_DEPARTMENT_OTHER): Payer: Medicare Other | Admitting: Oncology

## 2011-03-16 DIAGNOSIS — D649 Anemia, unspecified: Secondary | ICD-10-CM

## 2011-03-16 DIAGNOSIS — Z853 Personal history of malignant neoplasm of breast: Secondary | ICD-10-CM

## 2011-03-16 DIAGNOSIS — N289 Disorder of kidney and ureter, unspecified: Secondary | ICD-10-CM

## 2011-03-16 LAB — CBC WITH DIFFERENTIAL/PLATELET
BASO%: 0 % (ref 0.0–2.0)
EOS%: 2 % (ref 0.0–7.0)
HCT: 33 % — ABNORMAL LOW (ref 34.8–46.6)
MCHC: 32.7 g/dL (ref 31.5–36.0)
MONO#: 0.4 10*3/uL (ref 0.1–0.9)
RBC: 3.33 10*6/uL — ABNORMAL LOW (ref 3.70–5.45)
RDW: 16.7 % — ABNORMAL HIGH (ref 11.2–14.5)
WBC: 9.4 10*3/uL (ref 3.9–10.3)
lymph#: 0.6 10*3/uL — ABNORMAL LOW (ref 0.9–3.3)

## 2011-03-30 ENCOUNTER — Other Ambulatory Visit: Payer: Self-pay | Admitting: Oncology

## 2011-03-30 ENCOUNTER — Encounter (HOSPITAL_BASED_OUTPATIENT_CLINIC_OR_DEPARTMENT_OTHER): Payer: Medicare Other | Admitting: Oncology

## 2011-03-30 DIAGNOSIS — D649 Anemia, unspecified: Secondary | ICD-10-CM

## 2011-03-30 DIAGNOSIS — N289 Disorder of kidney and ureter, unspecified: Secondary | ICD-10-CM

## 2011-03-30 DIAGNOSIS — Z853 Personal history of malignant neoplasm of breast: Secondary | ICD-10-CM

## 2011-03-30 LAB — CBC & DIFF AND RETIC
BASO%: 0.2 % (ref 0.0–2.0)
Immature Retic Fract: 12.1 % — ABNORMAL HIGH (ref 0.00–10.70)
LYMPH%: 6.5 % — ABNORMAL LOW (ref 14.0–49.7)
MCHC: 32.1 g/dL (ref 31.5–36.0)
MONO#: 0.5 10*3/uL (ref 0.1–0.9)
NEUT#: 8.1 10*3/uL — ABNORMAL HIGH (ref 1.5–6.5)
Platelets: 222 10*3/uL (ref 145–400)
RBC: 4.11 10*6/uL (ref 3.70–5.45)
RDW: 18 % — ABNORMAL HIGH (ref 11.2–14.5)
Retic %: 4.16 % — ABNORMAL HIGH (ref 0.50–1.50)
WBC: 9.3 10*3/uL (ref 3.9–10.3)
nRBC: 0 % (ref 0–0)

## 2011-03-30 LAB — IRON AND TIBC
Iron: 33 ug/dL — ABNORMAL LOW (ref 42–145)
UIBC: 197 ug/dL

## 2011-03-30 LAB — COMPREHENSIVE METABOLIC PANEL
Alkaline Phosphatase: 107 U/L (ref 39–117)
CO2: 27 mEq/L (ref 19–32)
Creatinine, Ser: 3.52 mg/dL — ABNORMAL HIGH (ref 0.50–1.10)
Glucose, Bld: 95 mg/dL (ref 70–99)
Total Bilirubin: 0.5 mg/dL (ref 0.3–1.2)

## 2011-03-30 LAB — FERRITIN: Ferritin: 361 ng/mL — ABNORMAL HIGH (ref 10–291)

## 2011-04-13 ENCOUNTER — Other Ambulatory Visit: Payer: Self-pay | Admitting: Oncology

## 2011-04-13 ENCOUNTER — Encounter (HOSPITAL_BASED_OUTPATIENT_CLINIC_OR_DEPARTMENT_OTHER): Payer: Medicare Other | Admitting: Oncology

## 2011-04-13 DIAGNOSIS — Z853 Personal history of malignant neoplasm of breast: Secondary | ICD-10-CM

## 2011-04-13 LAB — CBC WITH DIFFERENTIAL/PLATELET
Eosinophils Absolute: 0.1 10*3/uL (ref 0.0–0.5)
HCT: 38.8 % (ref 34.8–46.6)
LYMPH%: 7.3 % — ABNORMAL LOW (ref 14.0–49.7)
MCV: 98.1 fL (ref 79.5–101.0)
MONO%: 3.6 % (ref 0.0–14.0)
NEUT#: 6.9 10*3/uL — ABNORMAL HIGH (ref 1.5–6.5)
NEUT%: 87.3 % — ABNORMAL HIGH (ref 38.4–76.8)
Platelets: 215 10*3/uL (ref 145–400)
RBC: 3.96 10*6/uL (ref 3.70–5.45)

## 2011-05-11 ENCOUNTER — Other Ambulatory Visit: Payer: Self-pay | Admitting: Oncology

## 2011-05-11 ENCOUNTER — Encounter (HOSPITAL_BASED_OUTPATIENT_CLINIC_OR_DEPARTMENT_OTHER): Payer: Medicare Other | Admitting: Oncology

## 2011-05-11 DIAGNOSIS — Z853 Personal history of malignant neoplasm of breast: Secondary | ICD-10-CM

## 2011-05-11 LAB — CBC WITH DIFFERENTIAL/PLATELET
Basophils Absolute: 0 10*3/uL (ref 0.0–0.1)
EOS%: 2.1 % (ref 0.0–7.0)
Eosinophils Absolute: 0.2 10*3/uL (ref 0.0–0.5)
HGB: 12 g/dL (ref 11.6–15.9)
MCH: 31.8 pg (ref 25.1–34.0)
NEUT#: 9.9 10*3/uL — ABNORMAL HIGH (ref 1.5–6.5)
RDW: 17.7 % — ABNORMAL HIGH (ref 11.2–14.5)
lymph#: 0.6 10*3/uL — ABNORMAL LOW (ref 0.9–3.3)

## 2011-06-08 ENCOUNTER — Encounter (HOSPITAL_BASED_OUTPATIENT_CLINIC_OR_DEPARTMENT_OTHER): Payer: Medicare Other | Admitting: Oncology

## 2011-06-08 ENCOUNTER — Other Ambulatory Visit: Payer: Self-pay | Admitting: Oncology

## 2011-06-08 DIAGNOSIS — Z853 Personal history of malignant neoplasm of breast: Secondary | ICD-10-CM

## 2011-06-08 LAB — CBC WITH DIFFERENTIAL/PLATELET
Basophils Absolute: 0 10*3/uL (ref 0.0–0.1)
Eosinophils Absolute: 0.6 10*3/uL — ABNORMAL HIGH (ref 0.0–0.5)
HGB: 11 g/dL — ABNORMAL LOW (ref 11.6–15.9)
MCV: 94.3 fL (ref 79.5–101.0)
MONO#: 0.4 10*3/uL (ref 0.1–0.9)
MONO%: 3.4 % (ref 0.0–14.0)
NEUT#: 10.1 10*3/uL — ABNORMAL HIGH (ref 1.5–6.5)
RDW: 18.1 % — ABNORMAL HIGH (ref 11.2–14.5)
lymph#: 0.7 10*3/uL — ABNORMAL LOW (ref 0.9–3.3)

## 2011-07-13 ENCOUNTER — Other Ambulatory Visit (HOSPITAL_BASED_OUTPATIENT_CLINIC_OR_DEPARTMENT_OTHER): Payer: Medicare Other | Admitting: Lab

## 2011-07-13 ENCOUNTER — Other Ambulatory Visit: Payer: Self-pay | Admitting: Oncology

## 2011-07-13 ENCOUNTER — Ambulatory Visit (HOSPITAL_BASED_OUTPATIENT_CLINIC_OR_DEPARTMENT_OTHER): Payer: Medicare Other

## 2011-07-13 DIAGNOSIS — D649 Anemia, unspecified: Secondary | ICD-10-CM

## 2011-07-13 DIAGNOSIS — N289 Disorder of kidney and ureter, unspecified: Secondary | ICD-10-CM

## 2011-07-13 LAB — MORPHOLOGY: PLT EST: ADEQUATE

## 2011-07-13 LAB — COMPREHENSIVE METABOLIC PANEL
ALT: 15 U/L (ref 0–35)
Albumin: 3.8 g/dL (ref 3.5–5.2)
Alkaline Phosphatase: 88 U/L (ref 39–117)
CO2: 23 mEq/L (ref 19–32)
Glucose, Bld: 177 mg/dL — ABNORMAL HIGH (ref 70–99)
Potassium: 4.8 mEq/L (ref 3.5–5.3)
Sodium: 141 mEq/L (ref 135–145)
Total Protein: 5.9 g/dL — ABNORMAL LOW (ref 6.0–8.3)

## 2011-07-13 LAB — CBC & DIFF AND RETIC
Basophils Absolute: 0 10*3/uL (ref 0.0–0.1)
Eosinophils Absolute: 0.2 10*3/uL (ref 0.0–0.5)
HCT: 28.4 % — ABNORMAL LOW (ref 34.8–46.6)
HGB: 9.1 g/dL — ABNORMAL LOW (ref 11.6–15.9)
LYMPH%: 5.9 % — ABNORMAL LOW (ref 14.0–49.7)
MONO#: 0.4 10*3/uL (ref 0.1–0.9)
NEUT#: 10.9 10*3/uL — ABNORMAL HIGH (ref 1.5–6.5)
NEUT%: 89.7 % — ABNORMAL HIGH (ref 38.4–76.8)
Platelets: 210 10*3/uL (ref 145–400)
WBC: 12.1 10*3/uL — ABNORMAL HIGH (ref 3.9–10.3)

## 2011-07-13 LAB — IRON AND TIBC: %SAT: 17 % — ABNORMAL LOW (ref 20–55)

## 2011-07-13 LAB — FERRITIN: Ferritin: 591 ng/mL — ABNORMAL HIGH (ref 10–291)

## 2011-07-13 MED ORDER — DARBEPOETIN ALFA-POLYSORBATE 500 MCG/ML IJ SOLN
300.0000 ug | Freq: Once | INTRAMUSCULAR | Status: DC
  Administered 2011-07-13: 300 ug via SUBCUTANEOUS

## 2011-07-14 ENCOUNTER — Ambulatory Visit: Payer: Medicare Other

## 2011-07-14 ENCOUNTER — Other Ambulatory Visit: Payer: Medicare Other | Admitting: Lab

## 2011-07-21 ENCOUNTER — Ambulatory Visit (HOSPITAL_BASED_OUTPATIENT_CLINIC_OR_DEPARTMENT_OTHER): Payer: Medicare Other | Admitting: Oncology

## 2011-07-21 ENCOUNTER — Telehealth: Payer: Self-pay | Admitting: *Deleted

## 2011-07-21 VITALS — BP 131/65 | HR 67 | Temp 97.8°F | Ht 60.5 in | Wt 148.9 lb

## 2011-07-21 DIAGNOSIS — Z1231 Encounter for screening mammogram for malignant neoplasm of breast: Secondary | ICD-10-CM

## 2011-07-21 DIAGNOSIS — D631 Anemia in chronic kidney disease: Secondary | ICD-10-CM

## 2011-07-21 DIAGNOSIS — Z87898 Personal history of other specified conditions: Secondary | ICD-10-CM

## 2011-07-21 DIAGNOSIS — N189 Chronic kidney disease, unspecified: Secondary | ICD-10-CM

## 2011-07-21 NOTE — Progress Notes (Signed)
Hematology and Oncology Follow Up Visit  Miranda [name] [id number] [birth year] 77 y.o. 07/21/2011 3:44 PM Cc. Dr Marina Gravel; Dr Theressa Millard  Principle Diagnosis: 66-year-old woman with history of DCIS status post lumpectomy and radiation treated in  2004; history of anemia a related to chronic renal insufficiency; history of gout and chronic osteoarthritis  Interim History:  Returns for followup she is here with her husband. Overall she is doing fairly well. She has not yet initiated dialysis.s He has generally good health. Her husband are living in any stated living. She has been receiving Aranesp injection does not receive them in the past 2 months. He did receive one this month and feels much better. She does have some chronic pain in her right knee as well as shoulder. She has some dyspnea on exertion and was asking about the possibility of using a wheelchair to get her help her get around her facility.  Medications: I have reviewed the patient's current medications.  Allergies: No Known Allergies  Past Medical History, Surgical history, Social history, and Family History were reviewed and updated.  Review of Systems: Constitutional:  Negative for fever, chills, night sweats, anorexia, weight loss, pain. Cardiovascular: no chest pain or dyspnea on exertion positive for - dyspnea on exertion Respiratory: positive for - shortness of breath Neurological: negative Dermatological: negative ENT: negative Skin Gastrointestinal: no abdominal pain, change in bowel habits, or black or bloody stools Genito-Urinary: no dysuria, trouble voiding, or hematuria Hematological and Lymphatic: negative Breast: negative for breast lumps Musculoskeletal: positive for - joint pain in shoulder and the knee Remaining ROS negative.  Physical Exam: Blood pressure 131/65, pulse 67, temperature 97.8 F (36.6 C), temperature source Oral, height 5' 0.5" (1.537 m), weight 148 lb 14.4 oz (67.541 kg). ECOG:  1 General appearance: alert, cooperative and appears stated age Head: Normocephalic, without obvious abnormality, atraumatic Neck: no adenopathy, no carotid bruit, no JVD, supple, symmetrical, trachea midline and thyroid not enlarged, symmetric, no tenderness/mass/nodules Lymph nodes: Cervical, supraclavicular, and axillary nodes normal. Cardiac : Normal heart sounds Pulmonary: Normal breath sounds Breasts: Right and left breast are free of any masses both axilla negative Abdomen: No organomegaly or masses Extremities normal Neuro: Normal  Lab Results: Lab Results  Component Value Date   WBC 12.1* 07/13/2011   HGB 9.1* 07/13/2011   HCT 28.4* 07/13/2011   MCV 94.0 07/13/2011   PLT 210 07/13/2011     Chemistry      Component Value Date/Time   NA 141 07/13/2011 1309   NA 141 07/13/2011 1309   K 4.8 07/13/2011 1309   K 4.8 07/13/2011 1309   CL 107 07/13/2011 1309   CL 107 07/13/2011 1309   CO2 23 07/13/2011 1309   CO2 23 07/13/2011 1309   BUN 65* 07/13/2011 1309   BUN 65* 07/13/2011 1309   CREATININE 3.47* 07/13/2011 1309   CREATININE 3.47* 07/13/2011 1309      Component Value Date/Time   CALCIUM 8.4 07/13/2011 1309   CALCIUM 8.4 07/13/2011 1309   ALKPHOS 88 07/13/2011 1309   ALKPHOS 88 07/13/2011 1309   AST 25 07/13/2011 1309   AST 25 07/13/2011 1309   ALT 15 07/13/2011 1309   ALT 15 07/13/2011 1309   BILITOT 0.4 07/13/2011 1309   BILITOT 0.4 07/13/2011 1309       Radiological Studies: chest X-ray n/a Mammogram Followup due in May Bone density n/a  Impression and Plan: Miranda [name] is doing fairly well given her underlying renal insufficiency. Given her  a schedule to continue and has monthly. This seems to be helping her. I have scheduled her mammogram for May I will see her in 6 months from now.  More than 50% of the visit was spent in patient-related counselling   Pierce Crane, MD 11/23/20123:44 PM

## 2011-07-21 NOTE — Telephone Encounter (Signed)
Gave patient appointment for 12-2011 

## 2011-07-24 ENCOUNTER — Telehealth: Payer: Self-pay | Admitting: *Deleted

## 2011-07-24 NOTE — Telephone Encounter (Signed)
informed the patient of the appointment for the mammogram on 01-04-2012 at [facility] at 10:00am

## 2011-07-25 ENCOUNTER — Telehealth: Payer: Self-pay | Admitting: *Deleted

## 2011-07-25 NOTE — Telephone Encounter (Signed)
made patient appointment for mammogram on 01-04-2012 at 1:30pm at [facility]

## 2011-08-18 ENCOUNTER — Other Ambulatory Visit: Payer: Medicare Other | Admitting: Lab

## 2011-08-18 ENCOUNTER — Ambulatory Visit: Payer: Medicare Other

## 2011-08-24 ENCOUNTER — Telehealth: Payer: Self-pay | Admitting: *Deleted

## 2011-08-24 ENCOUNTER — Encounter: Payer: Self-pay | Admitting: *Deleted

## 2011-08-24 NOTE — Telephone Encounter (Signed)
patient confirmed over the phone the new date and time on 08-31-2011 for lab and injection

## 2011-08-24 NOTE — Telephone Encounter (Signed)
patient called in stating we had her and her husband mixed out in the system after talking to the patient some more come to find out the patient was contacted by janice the injection nurse so patient was aware of both appointments were no show  

## 2011-08-25 ENCOUNTER — Telehealth: Payer: Self-pay | Admitting: Oncology

## 2011-08-25 NOTE — Telephone Encounter (Signed)
Pt is aware to pick up the jan-may 2013 appts

## 2011-08-31 ENCOUNTER — Telehealth: Payer: Self-pay | Admitting: Oncology

## 2011-08-31 ENCOUNTER — Ambulatory Visit (HOSPITAL_BASED_OUTPATIENT_CLINIC_OR_DEPARTMENT_OTHER): Payer: Medicare Other

## 2011-08-31 ENCOUNTER — Other Ambulatory Visit (HOSPITAL_BASED_OUTPATIENT_CLINIC_OR_DEPARTMENT_OTHER): Payer: Medicare Other | Admitting: Lab

## 2011-08-31 DIAGNOSIS — D649 Anemia, unspecified: Secondary | ICD-10-CM

## 2011-08-31 DIAGNOSIS — N289 Disorder of kidney and ureter, unspecified: Secondary | ICD-10-CM

## 2011-08-31 DIAGNOSIS — D631 Anemia in chronic kidney disease: Secondary | ICD-10-CM

## 2011-08-31 LAB — CBC WITH DIFFERENTIAL/PLATELET
Basophils Absolute: 0 10*3/uL (ref 0.0–0.1)
EOS%: 2.9 % (ref 0.0–7.0)
LYMPH%: 5.5 % — ABNORMAL LOW (ref 14.0–49.7)
MCH: 31.8 pg (ref 25.1–34.0)
MCV: 95.9 fL (ref 79.5–101.0)
MONO%: 4.6 % (ref 0.0–14.0)
RBC: 3.28 10*6/uL — ABNORMAL LOW (ref 3.70–5.45)
RDW: 17.6 % — ABNORMAL HIGH (ref 11.2–14.5)

## 2011-08-31 MED ORDER — DARBEPOETIN ALFA-POLYSORBATE 500 MCG/ML IJ SOLN
300.0000 ug | Freq: Once | INTRAMUSCULAR | Status: AC
  Administered 2011-08-31: 300 ug via SUBCUTANEOUS
  Filled 2011-08-31: qty 1

## 2011-08-31 MED ORDER — DARBEPOETIN ALFA-POLYSORBATE 300 MCG/0.6ML IJ SOLN: 300.0000 ug | Freq: Once | INTRAMUSCULAR | Status: DC

## 2011-08-31 NOTE — Telephone Encounter (Signed)
gve the pt her Vanessa Kick -may 2013 appt calendar

## 2011-09-15 ENCOUNTER — Ambulatory Visit: Payer: Medicare Other

## 2011-09-15 ENCOUNTER — Other Ambulatory Visit: Payer: Medicare Other

## 2011-09-28 ENCOUNTER — Ambulatory Visit: Payer: Medicare Other

## 2011-09-28 ENCOUNTER — Other Ambulatory Visit (HOSPITAL_BASED_OUTPATIENT_CLINIC_OR_DEPARTMENT_OTHER): Payer: Medicare Other | Admitting: Lab

## 2011-09-28 DIAGNOSIS — Z853 Personal history of malignant neoplasm of breast: Secondary | ICD-10-CM

## 2011-09-28 DIAGNOSIS — D649 Anemia, unspecified: Secondary | ICD-10-CM

## 2011-09-28 DIAGNOSIS — N189 Chronic kidney disease, unspecified: Secondary | ICD-10-CM

## 2011-09-28 DIAGNOSIS — D631 Anemia in chronic kidney disease: Secondary | ICD-10-CM

## 2011-09-28 LAB — CBC WITH DIFFERENTIAL/PLATELET
BASO%: 0.4 % (ref 0.0–2.0)
EOS%: 2.5 % (ref 0.0–7.0)
Eosinophils Absolute: 0.3 10*3/uL (ref 0.0–0.5)
LYMPH%: 7.4 % — ABNORMAL LOW (ref 14.0–49.7)
MCHC: 31.8 g/dL (ref 31.5–36.0)
MCV: 95.2 fL (ref 79.5–101.0)
MONO%: 5.5 % (ref 0.0–14.0)
NEUT#: 9.7 10*3/uL — ABNORMAL HIGH (ref 1.5–6.5)
RBC: 3.96 10*6/uL (ref 3.70–5.45)
RDW: 17 % — ABNORMAL HIGH (ref 11.2–14.5)
nRBC: 0 % (ref 0–0)

## 2011-09-28 MED ORDER — DARBEPOETIN ALFA-POLYSORBATE 500 MCG/ML IJ SOLN: 300.0000 ug | Freq: Once | INTRAMUSCULAR | Status: DC

## 2011-10-04 ENCOUNTER — Other Ambulatory Visit: Payer: Medicare Other | Admitting: Lab

## 2011-10-10 ENCOUNTER — Ambulatory Visit: Payer: Medicare Other | Admitting: Oncology

## 2011-10-13 ENCOUNTER — Ambulatory Visit: Payer: Medicare Other | Admitting: Oncology

## 2011-10-13 ENCOUNTER — Other Ambulatory Visit: Payer: Medicare Other | Admitting: Lab

## 2011-10-18 ENCOUNTER — Ambulatory Visit (INDEPENDENT_AMBULATORY_CARE_PROVIDER_SITE_OTHER): Payer: Medicare Other | Admitting: Sports Medicine

## 2011-10-18 VITALS — BP 152/60 | Ht 60.0 in | Wt 145.0 lb

## 2011-10-18 DIAGNOSIS — M25569 Pain in unspecified knee: Secondary | ICD-10-CM

## 2011-10-18 DIAGNOSIS — M25561 Pain in right knee: Secondary | ICD-10-CM | POA: Insufficient documentation

## 2011-10-18 MED ORDER — KETOPROFEN POWD: Status: DC

## 2011-10-18 MED ORDER — TRAMADOL HCL 50 MG PO TABS: 50.0000 mg | ORAL_TABLET | Freq: Four times a day (QID) | ORAL | Status: AC | PRN

## 2011-10-18 NOTE — Progress Notes (Signed)
  Subjective:    Patient ID: Miranda [name], female    DOB: 03/15/1922, 78 y.o.   MRN: [id number]  HPI Bernestine is a very pleasant lady who comes in with a complaint of right knee pain. She's already been through cortisone injections which were of equivocal benefit, she is also been through Visco supplementation which did not seem to help her, she's already had a knee arthroscopy. She has had an MRI in 2010 that showed tricompartmental degenerative joint disease, with a medial and lateral meniscal tear, as well as with a chronic anterior cruciate ligament tear. She has been using Tylenol which has not really been helping. She is here looking for a nonsurgical answer to her pain.  Past medical history, surgical history, family history, social history, allergies, and medications reviewed from the medical record and no changes needed.  Review of Systems    No fevers, chills, night sweats, weight loss, chest pain, or shortness of breath.  Social History: Non-smoker. Objective:   Physical Exam General:  Well developed, well nourished, and in no acute distress. Neuro:  Alert and oriented x3, extra-ocular muscles intact. Skin: Warm and dry, no rashes noted. Respiratory:  Not using accessory muscles, speaking in full sentences. Musculoskeletal: Knee: Normal to inspection with no erythema or effusion or obvious bony abnormalities. Palpation normal with no warmth, joint line tenderness, patellar tenderness, or condyle tenderness. ROM full in flexion and extension and lower leg rotation. Ligaments with solid consistent endpoints including ACL, PCL, LCL, MCL. Negative Mcmurray's, Apley's, and Thessalonian tests. Non painful patellar compression. Patellar glide without crepitus. Patellar and quadriceps tendons unremarkable. Hamstring and quadriceps strength is normal.   Procedure: Consent obtained and verified. Time-out conducted. Noted no overlying erythema, induration, or other signs of local  infection. Skin prepped in a sterile fashion. Topical analgesic spray: Ethyl chloride. Joint: Right knee Completed without difficulty. Meds: 1 cc Depo-Medrol 40, 4 cc lidocaine Pain immediately improved suggesting accurate placement of the medication. Advised to call if fevers/chills, erythema, induration, drainage, or persistent bleeding.      Assessment & Plan:

## 2011-10-18 NOTE — Assessment & Plan Note (Addendum)
Tricompartmental arthritis.  MRI reviewed reveals that she has significant arthritis but also has degenerative tears of her meniscus. Plain film reveals calcification in the meniscus medially and laterally. Injection. Already has good cushion. Topical ketoprofen. Tramadol. Knee sleeve. Aquatic therapy. RTC 4-6 weeks.

## 2011-10-18 NOTE — Patient Instructions (Signed)
Aquatic therapy. Injection today. Tramadol. Knee sleeve. Stay active. Ketoprofen topical Come back to see [place] in 4 weeks.

## 2011-10-23 ENCOUNTER — Telehealth: Payer: Self-pay | Admitting: Oncology

## 2011-10-23 NOTE — Telephone Encounter (Signed)
S/w the pt and she is aware of her r/s apptas from feb to [phone]

## 2011-10-23 NOTE — Telephone Encounter (Signed)
Unable to contact this pt via telephone phone keeps ringing busy signal.

## 2011-10-26 ENCOUNTER — Other Ambulatory Visit: Payer: Medicare Other | Admitting: Lab

## 2011-10-26 ENCOUNTER — Ambulatory Visit: Payer: Medicare Other | Admitting: Oncology

## 2011-11-01 ENCOUNTER — Telehealth: Payer: Self-pay | Admitting: Oncology

## 2011-11-01 ENCOUNTER — Other Ambulatory Visit (HOSPITAL_BASED_OUTPATIENT_CLINIC_OR_DEPARTMENT_OTHER): Payer: Medicare Other | Admitting: Lab

## 2011-11-01 ENCOUNTER — Ambulatory Visit (HOSPITAL_BASED_OUTPATIENT_CLINIC_OR_DEPARTMENT_OTHER): Payer: Medicare Other | Admitting: Physician Assistant

## 2011-11-01 VITALS — BP 146/83 | HR 52 | Temp 96.9°F | Ht 60.0 in | Wt 148.6 lb

## 2011-11-01 DIAGNOSIS — D649 Anemia, unspecified: Secondary | ICD-10-CM

## 2011-11-01 DIAGNOSIS — Z853 Personal history of malignant neoplasm of breast: Secondary | ICD-10-CM

## 2011-11-01 DIAGNOSIS — N189 Chronic kidney disease, unspecified: Secondary | ICD-10-CM

## 2011-11-01 DIAGNOSIS — D631 Anemia in chronic kidney disease: Secondary | ICD-10-CM

## 2011-11-01 LAB — CBC WITH DIFFERENTIAL/PLATELET
BASO%: 0.1 % (ref 0.0–2.0)
EOS%: 1.6 % (ref 0.0–7.0)
Eosinophils Absolute: 0.2 10*3/uL (ref 0.0–0.5)
MCHC: 32 g/dL (ref 31.5–36.0)
MCV: 94 fL (ref 79.5–101.0)
MONO%: 4.2 % (ref 0.0–14.0)
NEUT#: 8.2 10*3/uL — ABNORMAL HIGH (ref 1.5–6.5)
RBC: 3.8 10*6/uL (ref 3.70–5.45)
RDW: 18.9 % — ABNORMAL HIGH (ref 11.2–14.5)

## 2011-11-01 MED ORDER — DARBEPOETIN ALFA-POLYSORBATE 500 MCG/ML IJ SOLN
300.0000 ug | Freq: Once | INTRAMUSCULAR | Status: DC
  Filled 2011-11-01: qty 1

## 2011-11-01 NOTE — Progress Notes (Signed)
Hematology and Oncology Follow Up Visit  Miranda [name] [id number] 15-Oct-1921 78 y.o. 11/01/2011    HPI: Patient is an 78 year old [place] woman with a history of: 1. DCIS of the right breast, s/p right lumpectomy with sentinel node dissection, s/p XRT completed 2004. On observation alone. 2. Anemia related to chronic renal insufficiency, Aranesp 300 mcg subcutaneous injection on a every 3-4 week basis. 3. History of gout. 4. History of chronic osteoarthritis.  Interim History:   Miranda [name] is seen today prior to her next monthly dose of Aranesp. Overall she feels quite well denying any increasing fatigue, no shortness of breath or chest pain issues. She states that she was seen by Dr. Caryn Section in nephrology yesterday, and that her renal function is such that she does not need to initiate dialysis "as of yet". She and her husband are residing in [facility] assisted living. She wonders about the possibility of coordinating she and her husband's followup appointments there are office since her husband sees Dr. Clelia Croft also on a monthly basis.  A detailed review of systems is otherwise noncontributory as noted below.  Review of Systems: Constitutional:  no weight loss, fever, night sweats and feels well Eyes: no complaints ENT: no complaints Cardiovascular: no chest pain or dyspnea on exertion Respiratory: no cough, shortness of breath, or wheezing Neurological: no TIA or stroke symptoms Dermatological: negative Gastrointestinal: no abdominal pain, change in bowel habits, or black or bloody stools Genito-Urinary: no dysuria, trouble voiding, or hematuria Hematological and Lymphatic: negative Breast: negative Musculoskeletal: negative Remaining ROS negative.   Medications:   I have reviewed the patient's current medications.  Current Outpatient Prescriptions  Medication Sig Dispense Refill  . acetaminophen (TYLENOL) 500 MG tablet Take 500 mg by mouth every 6 (six) hours  as needed.        Marland Kitchen allopurinol (ZYLOPRIM) 300 MG tablet Take 300 mg by mouth daily.        Marland Kitchen amLODipine (NORVASC) 10 MG tablet Take 10 mg by mouth daily.        Marland Kitchen aspirin 81 MG tablet Take 81 mg by mouth daily.        Marland Kitchen atorvastatin (LIPITOR) 40 MG tablet Take 40 mg by mouth daily.        . calcitRIOL (ROCALTROL) 0.25 MCG capsule Take 0.25 mcg by mouth daily.        . calcium carbonate (OS-CAL) 600 MG TABS Take 600 mg by mouth 2 (two) times daily with a meal.        . Ketoprofen POWD Compound into a 20% gel, and apply to affected area 3 times a day.  100 g  3  . labetalol (NORMODYNE) 100 MG tablet Take 100 mg by mouth 2 (two) times daily.        . multivitamin-iron-minerals-folic acid (CENTRUM) chewable tablet Chew 1 tablet by mouth daily.        . pantoprazole (PROTONIX) 40 MG tablet Take 40 mg by mouth daily.        . prednisoLONE 5 MG TABS Take by mouth.         Current Facility-Administered Medications  Medication Dose Route Frequency Provider Last Rate Last Dose  . DISCONTD: darbepoetin alfa-polysorbate (ARANESP) injection 300 mcg  300 mcg Subcutaneous Once Pierce Crane, MD        Allergies: No Known Allergies  Physical Exam: Filed Vitals:   11/01/11 1402  BP: 146/83  Pulse: 52  Temp: 96.9 F (36.1 C)  Body mass index is 29.02 kg/(m^2). Weight: 148 lbs. HEENT:  Sclerae anicteric, conjunctivae pink.  Oropharynx clear.  No mucositis or candidiasis.   Lungs:  Clear to auscultation bilaterally.  No crackles, rhonchi, or wheezes.   Heart:  Regular rate and rhythm.   Neuro:  Nonfocal, alert and oriented x 3.   Lab Results: Lab Results  Component Value Date   WBC 9.4 11/01/2011   HGB 11.4* 11/01/2011   HCT 35.7 11/01/2011   MCV 94.0 11/01/2011   PLT 220 11/01/2011   NEUTROABS 8.2* 11/01/2011     Chemistry      Component Value Date/Time   NA 141 07/13/2011 1309   NA 141 07/13/2011 1309   K 4.8 07/13/2011 1309   K 4.8 07/13/2011 1309   CL 107 07/13/2011 1309   CL 107 07/13/2011  1309   CO2 23 07/13/2011 1309   CO2 23 07/13/2011 1309   BUN 65* 07/13/2011 1309   BUN 65* 07/13/2011 1309   CREATININE 3.47* 07/13/2011 1309   CREATININE 3.47* 07/13/2011 1309      Component Value Date/Time   CALCIUM 8.4 07/13/2011 1309   CALCIUM 8.4 07/13/2011 1309   ALKPHOS 88 07/13/2011 1309   ALKPHOS 88 07/13/2011 1309   AST 25 07/13/2011 1309   AST 25 07/13/2011 1309   ALT 15 07/13/2011 1309   ALT 15 07/13/2011 1309   BILITOT 0.4 07/13/2011 1309   BILITOT 0.4 07/13/2011 1309      Lab Results  Component Value Date   LABCA2 34 12/29/2009    Assessment:  Patient is an 78 year old [place] woman with a history of: 1. DCIS of the right breast, s/p right lumpectomy with sentinel node dissection, s/p XRT completed 2004. On observation alone. 2. Anemia related to chronic renal insufficiency, Aranesp 300 mcg subcutaneous injection on a every 3-4 week basis. 3. History of gout. 4. History of chronic osteoarthritis.  Case has been reviewed with Dr. Pierce Crane also spoke with the patient.  Plan:  Mrs. standing does not require Aranesp today. We will regroup on 11/30/2011 with a CBC and Aranesp injection if needed, and then again on 12/29/2011 with labs and injection alone. She is scheduled to see Dr. Donnie Coffin for followup on 01/18/2012, which I have instructed her to keep. She knows to contact us prior to her BP 1 11/30/2011 if the need should arise.  This plan was reviewed with the patient, who voices understanding and agreement.  She knows to call with any changes or problems.    Miranda [name] T, PA-C 11/01/2011

## 2011-11-01 NOTE — Telephone Encounter (Signed)
gve the pt her April,may 2013 appts

## 2011-11-10 ENCOUNTER — Other Ambulatory Visit: Payer: Medicare Other

## 2011-11-10 ENCOUNTER — Ambulatory Visit: Payer: Medicare Other

## 2011-11-15 ENCOUNTER — Ambulatory Visit: Payer: Medicare Other | Admitting: Sports Medicine

## 2011-11-23 ENCOUNTER — Ambulatory Visit: Payer: Medicare Other

## 2011-11-23 ENCOUNTER — Other Ambulatory Visit: Payer: Medicare Other

## 2011-11-29 ENCOUNTER — Other Ambulatory Visit: Payer: Self-pay | Admitting: *Deleted

## 2011-11-30 ENCOUNTER — Ambulatory Visit (HOSPITAL_BASED_OUTPATIENT_CLINIC_OR_DEPARTMENT_OTHER): Payer: Medicare Other

## 2011-11-30 ENCOUNTER — Other Ambulatory Visit (HOSPITAL_BASED_OUTPATIENT_CLINIC_OR_DEPARTMENT_OTHER): Payer: Medicare Other | Admitting: Lab

## 2011-11-30 VITALS — BP 186/85 | HR 81 | Temp 97.4°F

## 2011-11-30 DIAGNOSIS — N189 Chronic kidney disease, unspecified: Secondary | ICD-10-CM

## 2011-11-30 DIAGNOSIS — D649 Anemia, unspecified: Secondary | ICD-10-CM

## 2011-11-30 DIAGNOSIS — D631 Anemia in chronic kidney disease: Secondary | ICD-10-CM

## 2011-11-30 DIAGNOSIS — Z853 Personal history of malignant neoplasm of breast: Secondary | ICD-10-CM

## 2011-11-30 LAB — CBC WITH DIFFERENTIAL/PLATELET
BASO%: 0.2 % (ref 0.0–2.0)
Eosinophils Absolute: 0.3 10*3/uL (ref 0.0–0.5)
MONO#: 0.7 10*3/uL (ref 0.1–0.9)
NEUT#: 11.5 10*3/uL — ABNORMAL HIGH (ref 1.5–6.5)
Platelets: 208 10*3/uL (ref 145–400)
RBC: 3.57 10*6/uL — ABNORMAL LOW (ref 3.70–5.45)
RDW: 19.3 % — ABNORMAL HIGH (ref 11.2–14.5)
WBC: 13.1 10*3/uL — ABNORMAL HIGH (ref 3.9–10.3)
lymph#: 0.6 10*3/uL — ABNORMAL LOW (ref 0.9–3.3)
nRBC: 0 % (ref 0–0)

## 2011-11-30 MED ORDER — DARBEPOETIN ALFA-POLYSORBATE 300 MCG/0.6ML IJ SOLN
300.0000 ug | Freq: Once | INTRAMUSCULAR | Status: AC
  Administered 2011-11-30: 300 ug via SUBCUTANEOUS
  Filled 2011-11-30: qty 0.6

## 2011-12-06 ENCOUNTER — Other Ambulatory Visit: Payer: Self-pay | Admitting: Oncology

## 2011-12-08 ENCOUNTER — Other Ambulatory Visit: Payer: Medicare Other

## 2011-12-08 ENCOUNTER — Ambulatory Visit: Payer: Medicare Other

## 2011-12-13 ENCOUNTER — Telehealth: Payer: Self-pay | Admitting: *Deleted

## 2011-12-13 NOTE — Telephone Encounter (Signed)
per patient request changed appointment for lab and injection for 12-20-2011 starting at 12:15pm

## 2011-12-20 ENCOUNTER — Ambulatory Visit: Payer: Medicare Other

## 2011-12-20 ENCOUNTER — Other Ambulatory Visit (HOSPITAL_BASED_OUTPATIENT_CLINIC_OR_DEPARTMENT_OTHER): Payer: Medicare Other | Admitting: Lab

## 2011-12-20 DIAGNOSIS — Z853 Personal history of malignant neoplasm of breast: Secondary | ICD-10-CM

## 2011-12-20 DIAGNOSIS — D649 Anemia, unspecified: Secondary | ICD-10-CM

## 2011-12-20 LAB — CBC WITH DIFFERENTIAL/PLATELET
EOS%: 1.5 % (ref 0.0–7.0)
Eosinophils Absolute: 0.1 10*3/uL (ref 0.0–0.5)
HGB: 12.2 g/dL (ref 11.6–15.9)
MCH: 31 pg (ref 25.1–34.0)
MCV: 97.2 fL (ref 79.5–101.0)
MONO%: 5.3 % (ref 0.0–14.0)
NEUT#: 8.2 10*3/uL — ABNORMAL HIGH (ref 1.5–6.5)
RBC: 3.92 10*6/uL (ref 3.70–5.45)
RDW: 21.4 % — ABNORMAL HIGH (ref 11.2–14.5)
lymph#: 0.6 10*3/uL — ABNORMAL LOW (ref 0.9–3.3)
nRBC: 0 % (ref 0–0)

## 2011-12-20 NOTE — Progress Notes (Signed)
12/10/11 HGB 12.2 today.  Aranesp not given

## 2011-12-21 ENCOUNTER — Other Ambulatory Visit: Payer: Medicare Other | Admitting: Lab

## 2011-12-21 ENCOUNTER — Ambulatory Visit: Payer: Medicare Other

## 2011-12-28 ENCOUNTER — Ambulatory Visit: Payer: Medicare Other

## 2011-12-28 ENCOUNTER — Other Ambulatory Visit (HOSPITAL_BASED_OUTPATIENT_CLINIC_OR_DEPARTMENT_OTHER): Payer: Medicare Other | Admitting: Lab

## 2011-12-28 DIAGNOSIS — D649 Anemia, unspecified: Secondary | ICD-10-CM

## 2011-12-28 LAB — CBC WITH DIFFERENTIAL/PLATELET
BASO%: 0.4 % (ref 0.0–2.0)
Eosinophils Absolute: 0.2 10*3/uL (ref 0.0–0.5)
MCHC: 32.4 g/dL (ref 31.5–36.0)
MONO#: 0.4 10*3/uL (ref 0.1–0.9)
NEUT#: 9 10*3/uL — ABNORMAL HIGH (ref 1.5–6.5)
RBC: 3.92 10*6/uL (ref 3.70–5.45)
RDW: 19.7 % — ABNORMAL HIGH (ref 11.2–14.5)
WBC: 10.3 10*3/uL (ref 3.9–10.3)
lymph#: 0.7 10*3/uL — ABNORMAL LOW (ref 0.9–3.3)
nRBC: 0 % (ref 0–0)

## 2011-12-28 NOTE — Patient Instructions (Signed)
Aranesp not administered. The patients hemogloblin was 12.1. Call MD for Problems.

## 2011-12-28 NOTE — Progress Notes (Signed)
12/28/11----Aranesp not given. The patients hemoglobin was 12.1.

## 2012-01-05 ENCOUNTER — Telehealth: Payer: Self-pay | Admitting: *Deleted

## 2012-01-05 ENCOUNTER — Ambulatory Visit: Payer: Medicare Other

## 2012-01-05 ENCOUNTER — Other Ambulatory Visit: Payer: Medicare Other | Admitting: Lab

## 2012-01-05 NOTE — Telephone Encounter (Signed)
patient has orders from dr. Gerlene Burdock fox

## 2012-01-08 ENCOUNTER — Other Ambulatory Visit (HOSPITAL_BASED_OUTPATIENT_CLINIC_OR_DEPARTMENT_OTHER): Payer: Medicare Other | Admitting: Lab

## 2012-01-08 DIAGNOSIS — N289 Disorder of kidney and ureter, unspecified: Secondary | ICD-10-CM

## 2012-01-08 DIAGNOSIS — Z853 Personal history of malignant neoplasm of breast: Secondary | ICD-10-CM

## 2012-01-08 DIAGNOSIS — D649 Anemia, unspecified: Secondary | ICD-10-CM

## 2012-01-08 LAB — CBC & DIFF AND RETIC
Eosinophils Absolute: 0.4 10*3/uL (ref 0.0–0.5)
MCV: 94.2 fL (ref 79.5–101.0)
MONO#: 0.7 10*3/uL (ref 0.1–0.9)
MONO%: 5.9 % (ref 0.0–14.0)
NEUT#: 10 10*3/uL — ABNORMAL HIGH (ref 1.5–6.5)
RBC: 3.64 10*6/uL — ABNORMAL LOW (ref 3.70–5.45)
RDW: 18.7 % — ABNORMAL HIGH (ref 11.2–14.5)
Retic %: 1.56 % (ref 0.70–2.10)
Retic Ct Abs: 56.78 10*3/uL (ref 33.70–90.70)
WBC: 11.9 10*3/uL — ABNORMAL HIGH (ref 3.9–10.3)
lymph#: 0.9 10*3/uL (ref 0.9–3.3)
nRBC: 0 % (ref 0–0)

## 2012-01-08 LAB — COMPREHENSIVE METABOLIC PANEL
ALT: 18 U/L (ref 0–35)
AST: 29 U/L (ref 0–37)
Albumin: 3.8 g/dL (ref 3.5–5.2)
BUN: 80 mg/dL — ABNORMAL HIGH (ref 6–23)
Calcium: 9 mg/dL (ref 8.4–10.5)
Chloride: 106 mEq/L (ref 96–112)
Potassium: 5 mEq/L (ref 3.5–5.3)

## 2012-01-08 LAB — CHCC SMEAR

## 2012-01-08 LAB — MORPHOLOGY: PLT EST: ADEQUATE

## 2012-01-08 LAB — IRON AND TIBC: TIBC: 233 ug/dL — ABNORMAL LOW (ref 250–470)

## 2012-01-18 ENCOUNTER — Ambulatory Visit (HOSPITAL_BASED_OUTPATIENT_CLINIC_OR_DEPARTMENT_OTHER): Payer: Medicare Other | Admitting: Oncology

## 2012-01-18 ENCOUNTER — Telehealth: Payer: Self-pay | Admitting: Oncology

## 2012-01-18 ENCOUNTER — Other Ambulatory Visit (HOSPITAL_BASED_OUTPATIENT_CLINIC_OR_DEPARTMENT_OTHER): Payer: Medicare Other | Admitting: Lab

## 2012-01-18 VITALS — BP 149/75 | HR 72 | Temp 97.9°F | Ht 60.0 in | Wt 149.0 lb

## 2012-01-18 DIAGNOSIS — N289 Disorder of kidney and ureter, unspecified: Secondary | ICD-10-CM

## 2012-01-18 DIAGNOSIS — N189 Chronic kidney disease, unspecified: Secondary | ICD-10-CM

## 2012-01-18 DIAGNOSIS — D631 Anemia in chronic kidney disease: Secondary | ICD-10-CM

## 2012-01-18 DIAGNOSIS — D649 Anemia, unspecified: Secondary | ICD-10-CM

## 2012-01-18 DIAGNOSIS — Z853 Personal history of malignant neoplasm of breast: Secondary | ICD-10-CM

## 2012-01-18 LAB — CBC WITH DIFFERENTIAL/PLATELET
Basophils Absolute: 0 10*3/uL (ref 0.0–0.1)
EOS%: 0.8 % (ref 0.0–7.0)
Eosinophils Absolute: 0.1 10*3/uL (ref 0.0–0.5)
HGB: 10.2 g/dL — ABNORMAL LOW (ref 11.6–15.9)
MCH: 30.3 pg (ref 25.1–34.0)
MONO#: 1.2 10*3/uL — ABNORMAL HIGH (ref 0.1–0.9)
NEUT#: 16.1 10*3/uL — ABNORMAL HIGH (ref 1.5–6.5)
RDW: 18.3 % — ABNORMAL HIGH (ref 11.2–14.5)
WBC: 18 10*3/uL — ABNORMAL HIGH (ref 3.9–10.3)
lymph#: 0.6 10*3/uL — ABNORMAL LOW (ref 0.9–3.3)

## 2012-01-18 MED ORDER — DARBEPOETIN ALFA-POLYSORBATE 300 MCG/0.6ML IJ SOLN
300.0000 ug | Freq: Once | INTRAMUSCULAR | Status: AC
  Administered 2012-01-18: 300 ug via SUBCUTANEOUS
  Filled 2012-01-18: qty 0.6

## 2012-01-18 NOTE — Telephone Encounter (Signed)
gve the pt her June-nov 2013 appt calendar 

## 2012-01-23 ENCOUNTER — Other Ambulatory Visit: Payer: Medicare Other | Admitting: Lab

## 2012-01-23 ENCOUNTER — Ambulatory Visit: Payer: Medicare Other | Admitting: Oncology

## 2012-01-28 NOTE — Progress Notes (Signed)
Hematology and Oncology Follow Up Visit  Miranda [name] [id number] 16-Aug-1922 78 y.o. 01/28/2012    HPI: Patient is an 78 year old [place] woman with a history of: 1. DCIS of the right breast, s/p right lumpectomy with sentinel node dissection, s/p XRT completed 2004. On observation alone. 2. Anemia related to chronic renal insufficiency, Aranesp 300 mcg subcutaneous injection on a every 3-4 week basis. 3. History of gout. 4. History of chronic osteoarthritis.  Interim History:   Miranda [name] is seen today prior to her next monthly dose of Aranesp. Overall she feels quite well denying any increasing fatigue, no shortness of breath or chest pain issues. She is having some problems or gallop. She has chronic fatigue. Her renal function is essentially stable. Her hemoglobin has also been fairly good on Aranesp. She is also on low-dose prednisone likely for her gout.  A detailed review of systems is otherwise noncontributory as noted below.  Review of Systems: Constitutional:  no weight loss, fever, night sweats and feels well Eyes: no complaints ENT: no complaints Cardiovascular: no chest pain or dyspnea on exertion Respiratory: no cough, shortness of breath, or wheezing Neurological: no TIA or stroke symptoms Dermatological: negative Gastrointestinal: no abdominal pain, change in bowel habits, or black or bloody stools Genito-Urinary: no dysuria, trouble voiding, or hematuria Hematological and Lymphatic: negative Breast: negative Musculoskeletal: negative Remaining ROS negative.   Medications:   I have reviewed the patient's current medications.  Current Outpatient Prescriptions  Medication Sig Dispense Refill  . acetaminophen (TYLENOL) 500 MG tablet Take 500 mg by mouth every 6 (six) hours as needed.        Marland Kitchen allopurinol (ZYLOPRIM) 300 MG tablet Take 100 mg by mouth 2 (two) times daily.       Marland Kitchen amLODipine (NORVASC) 10 MG tablet Take 10 mg by mouth daily.        Marland Kitchen  aspirin 81 MG tablet Take 81 mg by mouth daily.        Marland Kitchen atorvastatin (LIPITOR) 40 MG tablet Take 40 mg by mouth daily.        . calcitRIOL (ROCALTROL) 0.25 MCG capsule Take 0.25 mcg by mouth daily.        . calcium carbonate (OS-CAL) 600 MG TABS Take 600 mg by mouth 2 (two) times daily with a meal.        . glucosamine-chondroitin 500-400 MG tablet Take 1 tablet by mouth 3 (three) times daily.      . Ketoprofen POWD Compound into a 20% gel, and apply to affected area 3 times a day.  100 g  3  . labetalol (NORMODYNE) 100 MG tablet Take 100 mg by mouth 2 (two) times daily.        . multivitamin-iron-minerals-folic acid (CENTRUM) chewable tablet Chew 1 tablet by mouth daily.        . pantoprazole (PROTONIX) 40 MG tablet Take 40 mg by mouth daily.        . prednisoLONE 5 MG TABS Take by mouth.          Allergies: No Known Allergies  Physical Exam: Filed Vitals:   01/18/12 1432  BP: 149/75  Pulse: 72  Temp: 97.9 F (36.6 C)    Body mass index is 29.10 kg/(m^2). Weight: 148 lbs. HEENT:  Sclerae anicteric, conjunctivae pink.  Oropharynx clear.  No mucositis or candidiasis.   Lungs:  Clear to auscultation bilaterally.  No crackles, rhonchi, or wheezes.   Heart:  Regular rate and rhythm.  Neuro:  Nonfocal, alert and oriented x 3.   Lab Results: Lab Results  Component Value Date   WBC 18.0* 01/18/2012   HGB 10.2* 01/18/2012   HCT 31.5* 01/18/2012   MCV 93.5 01/18/2012   PLT 226 01/18/2012   NEUTROABS 16.1* 01/18/2012     Chemistry      Component Value Date/Time   NA 141 01/08/2012 1047   K 5.0 01/08/2012 1047   CL 106 01/08/2012 1047   CO2 22 01/08/2012 1047   BUN 80* 01/08/2012 1047   CREATININE 3.88* 01/08/2012 1047      Component Value Date/Time   CALCIUM 9.0 01/08/2012 1047   ALKPHOS 81 01/08/2012 1047   AST 29 01/08/2012 1047   ALT 18 01/08/2012 1047   BILITOT 0.4 01/08/2012 1047      Lab Results  Component Value Date   LABCA2 34 12/29/2009    Assessment:  Patient is an  78 year old [place] woman with a history of: 1. DCIS of the right breast, s/p right lumpectomy with sentinel node dissection, s/p XRT completed 2004. On observation alone. 2. Anemia related to chronic renal insufficiency, Aranesp 300 mcg subcutaneous injection on a every 3-4 week basis. 3. History of gout. 4. History of chronic osteoarthritis.  Case has been reviewed with Dr. Pierce Crane also spoke with the patient.  Plan:  Patient is doing well. Her hemoglobin today is 10.6. She'll receive Aranesp. White count is elevated to 18. I suspect related to her prednisone. She'll continue to receive monthly CBCs injections and I will see her in 6 months time.Pierce Crane, MD 01/28/2012

## 2012-01-29 ENCOUNTER — Encounter: Payer: Self-pay | Admitting: Oncology

## 2012-02-05 ENCOUNTER — Other Ambulatory Visit: Payer: Medicare Other | Admitting: Lab

## 2012-02-07 ENCOUNTER — Other Ambulatory Visit: Payer: Medicare Other | Admitting: Lab

## 2012-02-09 ENCOUNTER — Other Ambulatory Visit: Payer: Medicare Other | Admitting: Lab

## 2012-02-15 ENCOUNTER — Ambulatory Visit: Payer: Medicare Other

## 2012-02-15 ENCOUNTER — Other Ambulatory Visit: Payer: Medicare Other | Admitting: Lab

## 2012-02-15 ENCOUNTER — Telehealth: Payer: Self-pay | Admitting: *Deleted

## 2012-02-15 NOTE — Telephone Encounter (Signed)
Called patient and inquiried about missed appt.  She states that she had called earlier today to cancel appointment cue to not feeling feel.  Told Miranda [name] to call back and reschedule appointment when she is feeling better.  Dr Renelda Loma nurse notified of this.

## 2012-02-16 ENCOUNTER — Ambulatory Visit (HOSPITAL_BASED_OUTPATIENT_CLINIC_OR_DEPARTMENT_OTHER): Payer: Medicare Other | Admitting: Lab

## 2012-02-16 ENCOUNTER — Ambulatory Visit (HOSPITAL_BASED_OUTPATIENT_CLINIC_OR_DEPARTMENT_OTHER): Payer: Medicare Other

## 2012-02-16 VITALS — BP 136/63 | HR 50 | Temp 97.7°F

## 2012-02-16 DIAGNOSIS — D649 Anemia, unspecified: Secondary | ICD-10-CM

## 2012-02-16 DIAGNOSIS — N189 Chronic kidney disease, unspecified: Secondary | ICD-10-CM

## 2012-02-16 DIAGNOSIS — N039 Chronic nephritic syndrome with unspecified morphologic changes: Secondary | ICD-10-CM

## 2012-02-16 LAB — CBC WITH DIFFERENTIAL/PLATELET
Basophils Absolute: 0 10*3/uL (ref 0.0–0.1)
Eosinophils Absolute: 0.3 10*3/uL (ref 0.0–0.5)
HGB: 8.8 g/dL — ABNORMAL LOW (ref 11.6–15.9)
LYMPH%: 2.3 % — ABNORMAL LOW (ref 14.0–49.7)
MCV: 98.4 fL (ref 79.5–101.0)
MONO%: 6.2 % (ref 0.0–14.0)
NEUT#: 12.5 10*3/uL — ABNORMAL HIGH (ref 1.5–6.5)
Platelets: 410 10*3/uL — ABNORMAL HIGH (ref 145–400)

## 2012-02-16 MED ORDER — DARBEPOETIN ALFA-POLYSORBATE 300 MCG/0.6ML IJ SOLN
300.0000 ug | Freq: Once | INTRAMUSCULAR | Status: AC
  Administered 2012-02-16: 300 ug via SUBCUTANEOUS
  Filled 2012-02-16: qty 0.6

## 2012-02-22 ENCOUNTER — Encounter: Payer: Self-pay | Admitting: Vascular Surgery

## 2012-02-23 ENCOUNTER — Ambulatory Visit (INDEPENDENT_AMBULATORY_CARE_PROVIDER_SITE_OTHER): Payer: Medicare Other | Admitting: *Deleted

## 2012-02-23 ENCOUNTER — Other Ambulatory Visit: Payer: Self-pay

## 2012-02-23 ENCOUNTER — Encounter: Payer: Self-pay | Admitting: Vascular Surgery

## 2012-02-23 ENCOUNTER — Ambulatory Visit (INDEPENDENT_AMBULATORY_CARE_PROVIDER_SITE_OTHER): Payer: Medicare Other | Admitting: Vascular Surgery

## 2012-02-23 VITALS — BP 147/54 | HR 58 | Temp 99.0°F | Ht 61.0 in | Wt 155.0 lb

## 2012-02-23 DIAGNOSIS — Z01810 Encounter for preprocedural cardiovascular examination: Secondary | ICD-10-CM

## 2012-02-23 DIAGNOSIS — N184 Chronic kidney disease, stage 4 (severe): Secondary | ICD-10-CM

## 2012-02-23 NOTE — Progress Notes (Addendum)
[facility] [place]  Referred by:  Darnelle Bos, MD [address], [facility], [place], [place] [address]  Reason for referral: New access  History of Present Illness  Miranda [name] is a 78 y.o. ([birth year]) female who presents for evaluation for permanent access.  The patient is right hand dominant.  The patient has not had previous access procedures.  Previous central venous cannulation procedures include: none.  The patient has never had a PPM placed.   Past Medical History  Diagnosis Date  . Chronic kidney disease   HTN DJD Gout Spinal stenosis Hypercholesterolemia   Past Surgical History  Procedure Date  . Parathyroidectomy   . Knee surgery   . Breast lumpectomy     left  s/p perineal SCC exc S/p R lower parathyroidectomy  History   Social History  . Marital Status: Married    Spouse Name: N/A    Number of Children: N/A  . Years of Education: N/A   Occupational History  . Not on file.   Social History Main Topics  . Smoking status: Never Smoker   . Smokeless tobacco: Never Used  . Alcohol Use: No  . Drug Use: No  . Sexually Active: Not on file   Other Topics Concern  . Not on file   Social History Narrative  . No narrative on file    Family History  Problem Relation Age of Onset  . Hypertension Mother     Current Outpatient Prescriptions on File Prior to Visit  Medication Sig Dispense Refill  . acetaminophen (TYLENOL) 500 MG tablet Take 500 mg by mouth every 6 (six) hours as needed.        Marland Kitchen allopurinol (ZYLOPRIM) 300 MG tablet Take 100 mg by mouth 2 (two) times daily.       Marland Kitchen amLODipine (NORVASC) 10 MG tablet Take 10 mg by mouth daily.        Marland Kitchen aspirin 81 MG tablet Take 81 mg by mouth daily.        Marland Kitchen atorvastatin (LIPITOR) 40 MG tablet Take 40 mg by mouth daily.        . calcitRIOL (ROCALTROL) 0.25 MCG capsule Take 0.25 mcg by mouth daily.        . calcium carbonate  (OS-CAL) 600 MG TABS Take 600 mg by mouth 2 (two) times daily with a meal.        . glucosamine-chondroitin 500-400 MG tablet Take 1 tablet by mouth 3 (three) times daily.      Marland Kitchen labetalol (NORMODYNE) 100 MG tablet Take 100 mg by mouth 2 (two) times daily.        . multivitamin-iron-minerals-folic acid (CENTRUM) chewable tablet Chew 1 tablet by mouth daily.        . pantoprazole (PROTONIX) 40 MG tablet Take 40 mg by mouth daily.        . prednisoLONE 5 MG TABS Take by mouth.        . Ketoprofen POWD Compound into a 20% gel, and apply to affected area 3 times a day.  100 g  3    No Known Allergies  REVIEW OF SYSTEMS:  (Positives indicated with an "x", otherwise negative)  CARDIOVASCULAR: [ ]  chest pain    [ ]  chest pressure    [ ]  palpitations    [ ]  orthopnea   [ ]  dyspnea on exert. [ ]  claudication    [ ]  rest pain     [ ]   DVT     [ ]  phlebitis  PULMONARY:    [ ]  productive cough [ ]  asthma  [ ]  wheezing  NEUROLOGIC:    [ ]  weakness    [ ]  paresthesias   [ ]  aphasia    [ ]  amaurosis    [ ]  dizziness  HEMATOLOGIC:    [ ]  bleeding problems  [ ]  clotting disorders  MUSCULOSKEL: [ ]  joint pain     [ ]  joint swelling  GASTROINTEST:  [ ]   blood in stool   [ ]   hematemesis  GENITOURINARY:   [ ]   dysuria    [ ]   hematuria  PSYCHIATRIC:   [ ]  history of major depression  INTEGUMENTARY: [ ]  rashes    [ ]  ulcers  CONSTITUTIONAL:  [ ]  fever     [ ]  chills  Physical Examination  Filed Vitals:   02/23/12 1006  BP: 147/54  Pulse: 58  Temp: 99 F (37.2 C)  TempSrc: Oral  Height: 5\' 1"  (1.549 m)  Weight: 155 lb (70.308 kg)  SpO2: 96%   Body mass index is 29.29 kg/(m^2).  General: A&O x 3, elderly  Head: Robinette/AT  Ear/Nose/Throat: Hearing grossly intact, nares w/o erythema or drainage, oropharynx w/o Erythema/Exudate  Eyes: PERRLA, EOMI, R eye with hemorrhage in anterior chamber  Neck: Supple, no nuchal rigidity, no palpable LAD  Pulmonary: Sym exp, good air movt, CTAB, no  rales, rhonchi, & wheezing  Cardiac: RRR, Nl S1, S2, no rubs or gallops, HSM murmur heard best in aortic listening area  Vascular: Vessel Right Left  Radial Palpable Palpable  Brachial Palpable Palpable  Carotid Palpable, without bruit Palpable, without bruit  Aorta Non-palpable N/A  Femoral Palpable Palpable  Popliteal Non-palpable Non-palpable  PT Non-Palpable Non-Palpable  DP Non-Palpable Non-Palpable   Gastrointestinal: soft, NTND, -G/R, - HSM, - masses, - CVAT B  Musculoskeletal: M/S 5/5 in upper extremities, Upper extremities without ischemic changes   Neurologic: CN 2-12 intact , Pain and light touch intact in extremities ,  Motor exam as listed above  Psychiatric: Judgment intact, Mood & affect appropriate for pt's clinical situation  Dermatologic: See M/S exam for extremity exam, no rashes otherwise noted  Lymph : No Cervical, Axillary, or Inguinal lymphadenopathy   Non-Invasive Vascular Imaging  Vein Mapping  (Date: 02/23/12):   R arm: acceptable vein conduits include upper arm cephalic vein, lower arm cephalic vein marginally, basilic vein  L arm: acceptable vein conduits include upper arm cephalic and basilic veins are marginal  Outside Studies/Documentation 6 pages of outside documents were reviewed including: outpatient nephrology clinic charts.  Medical Decision Making  Miranda [name] is a 78 y.o. female who presents with chronic kidney disease stage IV  Based on vein mapping and examination, this patient's permanent access options include: R RC vs BC AVF (RC less likely), possible BVT, possible brachial vein transposition; L arm options are less likely though might be possible.  I would proceed with R arm fistula placement first.  I had an extensive discussion with this patient in regards to the nature of access surgery, including risk, benefits, and alternatives.    The patient is aware that the risks of access surgery include but are not limited to:  bleeding, infection, steal syndrome, nerve damage, ischemic monomelic neuropathy, failure of access to mature, and possible need for additional access procedures in the future.  The patient has agreed to proceed with the above procedure which will be scheduled: 3 JUL  13.  Leonides Sake, MD [facility] [place] Office: [phone] Pager: [phone]  02/23/2012, 1:29 PM

## 2012-02-26 ENCOUNTER — Encounter (HOSPITAL_COMMUNITY): Payer: Self-pay | Admitting: Pharmacy Technician

## 2012-02-27 ENCOUNTER — Encounter (HOSPITAL_COMMUNITY): Payer: Self-pay | Admitting: *Deleted

## 2012-02-27 MED ORDER — SODIUM CHLORIDE 0.9 % IV SOLN: INTRAVENOUS | Status: DC

## 2012-02-27 MED ORDER — CEFAZOLIN SODIUM-DEXTROSE 2-3 GM-% IV SOLR
2.0000 g | Freq: Once | INTRAVENOUS | Status: AC
  Administered 2012-02-28: 2 g via INTRAVENOUS
  Filled 2012-02-27: qty 50

## 2012-02-27 NOTE — Procedures (Unsigned)
CEPHALIC VEIN MAPPING  INDICATION:  Preop evaluation, chronic kidney disease stage 4.  HISTORY:  EXAM:  The right cephalic vein is compressible with diameter measurements ranging from 0.25 to 0.50 cm.  The right basilic vein is compressible with diameter measurements ranging from 0.30 to 0.40 cm.  The left cephalic vein is compressible with diameter measurements ranging from 0.13 to 0.33 cm.  The left basilic vein is compressible with diameter measurements ranging from 0.18 to 0.28 cm.  IMPRESSION:  Bilirubin cephalic and basilic veins are patent with diameter measurements as described above.  ___________________________________________ Fransisco Hertz, MD  CH/MEDQ  D:  02/26/2012  T:  02/26/2012  Job:  [id number]

## 2012-02-28 ENCOUNTER — Ambulatory Visit (HOSPITAL_COMMUNITY): Payer: Medicare Other

## 2012-02-28 ENCOUNTER — Encounter (HOSPITAL_COMMUNITY): Payer: Self-pay | Admitting: Certified Registered"

## 2012-02-28 ENCOUNTER — Ambulatory Visit (HOSPITAL_COMMUNITY): Payer: Medicare Other | Admitting: Certified Registered"

## 2012-02-28 ENCOUNTER — Telehealth: Payer: Self-pay | Admitting: Vascular Surgery

## 2012-02-28 ENCOUNTER — Encounter (HOSPITAL_COMMUNITY)
Admission: RE | Disposition: A | Discharge status: Home / Self Care | Payer: Self-pay | Source: Ambulatory Visit | Attending: Vascular Surgery

## 2012-02-28 ENCOUNTER — Encounter (HOSPITAL_COMMUNITY): Payer: Self-pay | Admitting: *Deleted

## 2012-02-28 ENCOUNTER — Ambulatory Visit (HOSPITAL_COMMUNITY)
Admission: RE | Admit: 2012-02-28 | Discharge: 2012-02-28 | Disposition: A | Discharge status: Home / Self Care | Payer: Medicare Other | Source: Ambulatory Visit | Attending: Vascular Surgery | Admitting: Vascular Surgery

## 2012-02-28 DIAGNOSIS — I120 Hypertensive chronic kidney disease with stage 5 chronic kidney disease or end stage renal disease: Secondary | ICD-10-CM | POA: Insufficient documentation

## 2012-02-28 DIAGNOSIS — E7800 Pure hypercholesterolemia, unspecified: Secondary | ICD-10-CM | POA: Insufficient documentation

## 2012-02-28 DIAGNOSIS — N184 Chronic kidney disease, stage 4 (severe): Secondary | ICD-10-CM

## 2012-02-28 DIAGNOSIS — N186 End stage renal disease: Secondary | ICD-10-CM | POA: Insufficient documentation

## 2012-02-28 DIAGNOSIS — N185 Chronic kidney disease, stage 5: Secondary | ICD-10-CM

## 2012-02-28 HISTORY — DX: Cardiac murmur, unspecified: R01.1

## 2012-02-28 HISTORY — DX: Gout, unspecified: M10.9

## 2012-02-28 HISTORY — PX: AV FISTULA PLACEMENT: SHX1204

## 2012-02-28 HISTORY — DX: Anemia, unspecified: D64.9

## 2012-02-28 HISTORY — DX: Essential (primary) hypertension: I10

## 2012-02-28 HISTORY — DX: Malignant (primary) neoplasm, unspecified: C80.1

## 2012-02-28 LAB — POCT I-STAT 4, (NA,K, GLUC, HGB,HCT)
HCT: 29 % — ABNORMAL LOW (ref 36.0–46.0)
Hemoglobin: 9.9 g/dL — ABNORMAL LOW (ref 12.0–15.0)
Potassium: 5.4 mEq/L — ABNORMAL HIGH (ref 3.5–5.1)
Sodium: 134 mEq/L — ABNORMAL LOW (ref 135–145)

## 2012-02-28 LAB — BASIC METABOLIC PANEL
Calcium: 8.8 mg/dL (ref 8.4–10.5)
Creatinine, Ser: 4.28 mg/dL — ABNORMAL HIGH (ref 0.50–1.10)
GFR calc Af Amer: 10 mL/min — ABNORMAL LOW (ref >90)
GFR calc non Af Amer: 8 mL/min — ABNORMAL LOW (ref >90)

## 2012-02-28 SURGERY — ARTERIOVENOUS (AV) FISTULA CREATION
Anesthesia: Monitor Anesthesia Care | Site: Arm Lower | Laterality: Right | Wound class: Clean

## 2012-02-28 MED ORDER — MUPIROCIN 2 % EX OINT
TOPICAL_OINTMENT | Freq: Two times a day (BID) | CUTANEOUS | Status: DC
  Filled 2012-02-28: qty 22

## 2012-02-28 MED ORDER — LIDOCAINE-EPINEPHRINE (PF) 1 %-1:200000 IJ SOLN
INTRAMUSCULAR | Status: AC
  Filled 2012-02-28: qty 10

## 2012-02-28 MED ORDER — 0.9 % SODIUM CHLORIDE (POUR BTL) OPTIME
TOPICAL | Status: DC | PRN
  Administered 2012-02-28: 1000 mL

## 2012-02-28 MED ORDER — LIDOCAINE HCL (CARDIAC) 20 MG/ML IV SOLN
INTRAVENOUS | Status: DC | PRN
  Administered 2012-02-28: 80 mg via INTRAVENOUS

## 2012-02-28 MED ORDER — OXYCODONE HCL 5 MG PO TABS: 5.0000 mg | ORAL_TABLET | ORAL | Status: AC | PRN

## 2012-02-28 MED ORDER — BUPIVACAINE HCL (PF) 0.5 % IJ SOLN
INTRAMUSCULAR | Status: DC | PRN
  Administered 2012-02-28: 30 mL

## 2012-02-28 MED ORDER — SODIUM CHLORIDE 0.9 % IV SOLN
INTRAVENOUS | Status: DC | PRN
  Administered 2012-02-28: 08:00:00 via INTRAVENOUS

## 2012-02-28 MED ORDER — SODIUM CHLORIDE 0.9 % IR SOLN
Status: DC | PRN
  Administered 2012-02-28: 08:00:00

## 2012-02-28 MED ORDER — THROMBIN 20000 UNITS EX SOLR
CUTANEOUS | Status: DC | PRN
  Administered 2012-02-28: 20000 [IU] via TOPICAL

## 2012-02-28 MED ORDER — LIDOCAINE-EPINEPHRINE (PF) 1 %-1:200000 IJ SOLN
INTRAMUSCULAR | Status: DC | PRN
  Administered 2012-02-28: 30 mL

## 2012-02-28 MED ORDER — HYDROMORPHONE HCL PF 1 MG/ML IJ SOLN: 0.2500 mg | INTRAMUSCULAR | Status: DC | PRN

## 2012-02-28 MED ORDER — THROMBIN 20000 UNITS EX SOLR
CUTANEOUS | Status: AC
  Filled 2012-02-28: qty 20000

## 2012-02-28 MED ORDER — BUPIVACAINE HCL (PF) 0.25 % IJ SOLN
INTRAMUSCULAR | Status: AC
  Filled 2012-02-28: qty 30

## 2012-02-28 MED ORDER — MUPIROCIN 2 % EX OINT
TOPICAL_OINTMENT | CUTANEOUS | Status: AC
  Filled 2012-02-28: qty 22

## 2012-02-28 MED ORDER — PROPOFOL 10 MG/ML IV EMUL
INTRAVENOUS | Status: DC | PRN
  Administered 2012-02-28: 75 ug/kg/min via INTRAVENOUS

## 2012-02-28 MED ORDER — THROMBIN 20000 UNITS EX KIT
PACK | CUTANEOUS | Status: DC | PRN
  Administered 2012-02-28: 10:00:00 via TOPICAL

## 2012-02-28 SURGICAL SUPPLY — 37 items
ADH SKN CLS APL DERMABOND .7 (GAUZE/BANDAGES/DRESSINGS) ×1
CANISTER SUCTION 2500CC (MISCELLANEOUS) ×2 IMPLANT
CLIP TI MEDIUM 6 (CLIP) ×2 IMPLANT
CLIP TI WIDE RED SMALL 6 (CLIP) ×2 IMPLANT
CLOTH BEACON ORANGE TIMEOUT ST (SAFETY) ×2 IMPLANT
COVER PROBE W GEL 5X96 (DRAPES) ×1 IMPLANT
COVER SURGICAL LIGHT HANDLE (MISCELLANEOUS) ×4 IMPLANT
DECANTER SPIKE VIAL GLASS SM (MISCELLANEOUS) ×2 IMPLANT
DERMABOND ADVANCED (GAUZE/BANDAGES/DRESSINGS) ×1
DERMABOND ADVANCED .7 DNX12 (GAUZE/BANDAGES/DRESSINGS) ×1 IMPLANT
DRAIN PENROSE 1/2X12 LTX STRL (WOUND CARE) IMPLANT
ELECT REM PT RETURN 9FT ADLT (ELECTROSURGICAL) ×2
ELECTRODE REM PT RTRN 9FT ADLT (ELECTROSURGICAL) ×1 IMPLANT
GLOVE BIO SURGEON STRL SZ 6.5 (GLOVE) ×2 IMPLANT
GLOVE BIO SURGEON STRL SZ7 (GLOVE) ×2 IMPLANT
GLOVE BIOGEL PI IND STRL 6.5 (GLOVE) IMPLANT
GLOVE BIOGEL PI IND STRL 7.5 (GLOVE) ×1 IMPLANT
GLOVE BIOGEL PI INDICATOR 6.5 (GLOVE) ×1
GLOVE BIOGEL PI INDICATOR 7.5 (GLOVE) ×2
GLOVE SURG SS PI 7.5 STRL IVOR (GLOVE) ×2 IMPLANT
GOWN PREVENTION PLUS XLARGE (GOWN DISPOSABLE) ×1 IMPLANT
GOWN STRL NON-REIN LRG LVL3 (GOWN DISPOSABLE) ×4 IMPLANT
KIT BASIN OR (CUSTOM PROCEDURE TRAY) ×2 IMPLANT
KIT ROOM TURNOVER OR (KITS) ×2 IMPLANT
NS IRRIG 1000ML POUR BTL (IV SOLUTION) ×2 IMPLANT
PACK CV ACCESS (CUSTOM PROCEDURE TRAY) ×2 IMPLANT
PAD ARMBOARD 7.5X6 YLW CONV (MISCELLANEOUS) ×4 IMPLANT
SPONGE SURGIFOAM ABS GEL 100 (HEMOSTASIS) IMPLANT
SUT MNCRL AB 4-0 PS2 18 (SUTURE) ×2 IMPLANT
SUT PROLENE 6 0 BV (SUTURE) ×1 IMPLANT
SUT PROLENE 7 0 BV 1 (SUTURE) ×2 IMPLANT
SUT VIC AB 3-0 SH 27 (SUTURE) ×2
SUT VIC AB 3-0 SH 27X BRD (SUTURE) ×1 IMPLANT
TOWEL OR 17X24 6PK STRL BLUE (TOWEL DISPOSABLE) ×2 IMPLANT
TOWEL OR 17X26 10 PK STRL BLUE (TOWEL DISPOSABLE) ×2 IMPLANT
UNDERPAD 30X30 INCONTINENT (UNDERPADS AND DIAPERS) ×2 IMPLANT
WATER STERILE IRR 1000ML POUR (IV SOLUTION) ×2 IMPLANT

## 2012-02-28 NOTE — Op Note (Signed)
OPERATIVE NOTE   PROCEDURE: right brachiocephalic arteriovenous fistula placement  PRE-OPERATIVE DIAGNOSIS: chronic kidney disease stage IV-V   POST-OPERATIVE DIAGNOSIS: same as above   SURGEON: Leonides Sake, MD  ASSISTANT(S): Doreatha Massed, PAC  ANESTHESIA: local and IV sedation  ESTIMATED BLOOD LOSS: 30 cc  FINDING(S): 1.  3-4 mm brachial artery with minimal atherosclerosis 2.  3 mm cephalic vein 3.  Palpable thrill and radial artery at end of case  SPECIMEN(S):  none  INDICATIONS:   Miranda [name] is a 78 y.o. female who presents with chronic kidney disease stage IV-V .  The patient is scheduled for right arteriovenous fistula placement.  The patient is aware the risks include but are not limited to: bleeding, infection, steal syndrome, nerve damage, ischemic monomelic neuropathy, failure to mature, and need for additional procedures.  The patient is aware of the risks of the procedure and elects to proceed forward.  DESCRIPTION: After full informed written consent was obtained from the patient, the patient was brought back to the operating room and placed supine upon the operating table.  Prior to induction, the patient received IV antibiotics.   After obtaining adequate anesthesia, the patient was then prepped and draped in the standard fashion for a right arm access procedure.  I turned my attention first to identifying the patient's cephalic vein and brachial artery.  Using SonoSite guidance, the location of these vessels were marked out on the skin.   At this point, I injected local anesthetic to obtain a field block of the antecubitum.  In total, I injected about 10 mL of a 1:1 mixture of 0.5% Marcaine without epinephrine and 1% lidocaine with epinephrine.  I made a transverse incision at the level of the antecubitum and dissected through the subcutaneous tissue and fascia to gain exposure of the brachial artery.  This was noted to be 3-4 mm in diameter externally.  This was  dissected out proximally and distally and controlled with vessel loops .  I then dissected out the cephalic vein.  This was noted to be 3 mm in diameter externally.  The distal segment of the vein was ligated with a  2-0 silk, and the vein was transected.  The proximal segment was iinterrogated with serial dilators.  The vein accepted up to a 4 mm dilator without any difficulty.  I then instilled the heparinized saline into the vein and clamped it.  At this point, I reset my exposure of the brachial artery and placed the artery under tension proximally and distally.  I made an arteriotomy with a #11 blade, and then I extended the arteriotomy with a Potts scissor.  I injected heparinized saline proximal and distal to this arteriotomy.  The vein was then sewn to the artery in an end-to-side configuration with a running stitch of 7-0 Prolene.  Prior to completing this anastomosis, I allowed the vein and artery to backbleed.  There was no evidence of clot from any vessels.  I completed the anastomosis in the usual fashion and then released all vessel loops and clamps.  There was a palpable thrill in the venous outflow, and there was a palpable radial pulse.  At this point, I irrigated out the surgical wound.  There was no further active bleeding.  The subcutaneous tissue was reapproximated with a running stitch of 3-0 Vicryl.  The skin was then reapproximated with a running subcuticular stitch of 4-0 Vicryl.  The skin was then cleaned, dried, and reinforced with Dermabond.  The patient tolerated  this procedure well.   COMPLICATIONS: none  CONDITION: stable  Leonides Sake, MD [facility] [place] Office: [phone] Pager: [phone]  02/28/2012, 10:05 AM

## 2012-02-28 NOTE — Telephone Encounter (Addendum)
Message copied by Shari Prows on Wed Feb 28, 2012  1:21 PM ------      Message from: Melene Plan      Created: Wed Feb 28, 2012 11:55 AM                   ----- Message -----         From: Fransisco Hertz, MD         Sent: 02/28/2012  10:08 AM           To: [name], Melene Plan, RN            ZAREEN JAMISON      [id number]      1922/06/06            PROCEDURE:      right brachiocephalic arteriovenous fistula placement            Asst: Doreatha Massed, [facility]             Follow-up: 4 weeks            I scheduled an appt for the above patient on Fri 03/29/12 at 1:30 with [facility]. I left voicemail for pt regarding this appt and also mailed letter. Jacklyn Shell

## 2012-02-28 NOTE — Interval H&P Note (Signed)
[facility] [place]  History and Physical Update  The patient was interviewed and re-examined.  The patient's previous History and Physical has been reviewed and is unchanged.  There is no change in the plan of care.  Leonides Sake, MD [facility] [place] Office: [phone] Pager: [phone]  02/28/2012, 7:43 AM

## 2012-02-28 NOTE — Anesthesia Preprocedure Evaluation (Signed)
Anesthesia Evaluation  Patient identified by MRN, date of birth, ID band Patient awake    Reviewed: Allergy & Precautions, H&P , NPO status , Patient's Chart, lab work & pertinent test results  Airway Mallampati: II      Dental   Pulmonary neg pulmonary ROS,  breath sounds clear to auscultation        Cardiovascular hypertension, Pt. on medications Rhythm:Regular Rate:Normal     Neuro/Psych    GI/Hepatic negative GI ROS, Neg liver ROS,   Endo/Other  negative endocrine ROS  Renal/GU CRFRenal disease     Musculoskeletal   Abdominal   Peds  Hematology   Anesthesia Other Findings   Reproductive/Obstetrics                           Anesthesia Physical Anesthesia Plan  ASA: III  Anesthesia Plan: MAC   Post-op Pain Management:    Induction: Intravenous  Airway Management Planned: Simple Face Mask  Additional Equipment:   Intra-op Plan:   Post-operative Plan:   Informed Consent: I have reviewed the patients History and Physical, chart, labs and discussed the procedure including the risks, benefits and alternatives for the proposed anesthesia with the patient or authorized representative who has indicated his/her understanding and acceptance.     Plan Discussed with: CRNA  Anesthesia Plan Comments:         Anesthesia Quick Evaluation

## 2012-02-28 NOTE — Progress Notes (Signed)
Patient not on dialysis. K= 5.4. Dr. Imogene Burn notified. No new order given.

## 2012-02-28 NOTE — Progress Notes (Signed)
Reported to dr. Caryn Section k+ 5.6. No new orders

## 2012-02-28 NOTE — Progress Notes (Signed)
Dr. Imogene Burn. At bedside. Ordered for a BMP stat.

## 2012-02-28 NOTE — Preoperative (Signed)
Beta Blockers   Reason not to administer Beta Blockers:Labetalol taken 02/28/12 @ 0500 per pt

## 2012-02-28 NOTE — Anesthesia Postprocedure Evaluation (Signed)
  Anesthesia Post-op Note  Patient: Miranda [name]  Procedure(s) Performed: Procedure(s) (LRB): ARTERIOVENOUS (AV) FISTULA CREATION (Right)  Patient Location: PACU  Anesthesia Type: MAC  Level of Consciousness: awake  Airway and Oxygen Therapy: Patient Spontanous Breathing  Post-op Pain: mild  Post-op Assessment: Post-op Vital signs reviewed  Post-op Vital Signs: Reviewed  Complications: No apparent anesthesia complications 

## 2012-02-28 NOTE — H&P (View-Only) (Signed)
[facility] [place]  Referred by:  Darnelle Bos, MD [address], [facility], [place], [place] [address]  Reason for referral: New access  History of Present Illness  Miranda [name] is a 78 y.o. (Feb 29, 1922) female who presents for evaluation for permanent access.  The patient is right hand dominant.  The patient has not had previous access procedures.  Previous central venous cannulation procedures include: none.  The patient has never had a PPM placed.   Past Medical History  Diagnosis Date  . Chronic kidney disease   HTN DJD Gout Spinal stenosis Hypercholesterolemia   Past Surgical History  Procedure Date  . Parathyroidectomy   . Knee surgery   . Breast lumpectomy     left  s/p perineal SCC exc S/p R lower parathyroidectomy  History   Social History  . Marital Status: Married    Spouse Name: N/A    Number of Children: N/A  . Years of Education: N/A   Occupational History  . Not on file.   Social History Main Topics  . Smoking status: Never Smoker   . Smokeless tobacco: Never Used  . Alcohol Use: No  . Drug Use: No  . Sexually Active: Not on file   Other Topics Concern  . Not on file   Social History Narrative  . No narrative on file    Family History  Problem Relation Age of Onset  . Hypertension Mother     Current Outpatient Prescriptions on File Prior to Visit  Medication Sig Dispense Refill  . acetaminophen (TYLENOL) 500 MG tablet Take 500 mg by mouth every 6 (six) hours as needed.        Marland Kitchen allopurinol (ZYLOPRIM) 300 MG tablet Take 100 mg by mouth 2 (two) times daily.       Marland Kitchen amLODipine (NORVASC) 10 MG tablet Take 10 mg by mouth daily.        Marland Kitchen aspirin 81 MG tablet Take 81 mg by mouth daily.        Marland Kitchen atorvastatin (LIPITOR) 40 MG tablet Take 40 mg by mouth daily.        . calcitRIOL (ROCALTROL) 0.25 MCG capsule Take 0.25 mcg by mouth daily.        . calcium carbonate  (OS-CAL) 600 MG TABS Take 600 mg by mouth 2 (two) times daily with a meal.        . glucosamine-chondroitin 500-400 MG tablet Take 1 tablet by mouth 3 (three) times daily.      Marland Kitchen labetalol (NORMODYNE) 100 MG tablet Take 100 mg by mouth 2 (two) times daily.        . multivitamin-iron-minerals-folic acid (CENTRUM) chewable tablet Chew 1 tablet by mouth daily.        . pantoprazole (PROTONIX) 40 MG tablet Take 40 mg by mouth daily.        . prednisoLONE 5 MG TABS Take by mouth.        . Ketoprofen POWD Compound into a 20% gel, and apply to affected area 3 times a day.  100 g  3    No Known Allergies  REVIEW OF SYSTEMS:  (Positives indicated with an "x", otherwise negative)  CARDIOVASCULAR: [ ]  chest pain    [ ]  chest pressure    [ ]  palpitations    [ ]  orthopnea   [ ]  dyspnea on exert. [ ]  claudication    [ ]  rest pain     [ ]   DVT     [ ]  phlebitis  PULMONARY:    [ ]  productive cough [ ]  asthma  [ ]  wheezing  NEUROLOGIC:    [ ]  weakness    [ ]  paresthesias   [ ]  aphasia    [ ]  amaurosis    [ ]  dizziness  HEMATOLOGIC:    [ ]  bleeding problems  [ ]  clotting disorders  MUSCULOSKEL: [ ]  joint pain     [ ]  joint swelling  GASTROINTEST:  [ ]   blood in stool   [ ]   hematemesis  GENITOURINARY:   [ ]   dysuria    [ ]   hematuria  PSYCHIATRIC:   [ ]  history of major depression  INTEGUMENTARY: [ ]  rashes    [ ]  ulcers  CONSTITUTIONAL:  [ ]  fever     [ ]  chills  Physical Examination  Filed Vitals:   02/23/12 1006  BP: 147/54  Pulse: 58  Temp: 99 F (37.2 C)  TempSrc: Oral  Height: 5\' 1"  (1.549 m)  Weight: 155 lb (70.308 kg)  SpO2: 96%   Body mass index is 29.29 kg/(m^2).  General: A&O x 3, elderly  Head: [place]/AT  Ear/Nose/Throat: Hearing grossly intact, nares w/o erythema or drainage, oropharynx w/o Erythema/Exudate  Eyes: PERRLA, EOMI  Neck: Supple, no nuchal rigidity, no palpable LAD  Pulmonary: Sym exp, good air movt, CTAB, no rales, rhonchi, & wheezing  Cardiac: RRR, Nl  S1, S2, no rubs or gallops, HSM murmur heard best in aortic listening area  Vascular: Vessel Right Left  Radial Palpable Palpable  Brachial Palpable Palpable  Carotid Palpable, without bruit Palpable, without bruit  Aorta Non-palpable N/A  Femoral Palpable Palpable  Popliteal Non-palpable Non-palpable  PT Non-Palpable Non-Palpable  DP Non-Palpable Non-Palpable   Gastrointestinal: soft, NTND, -G/R, - HSM, - masses, - CVAT B  Musculoskeletal: M/S 5/5 in upper extremities, Upper extremities without ischemic changes   Neurologic: CN 2-12 intact , Pain and light touch intact in extremities ,  Motor exam as listed above  Psychiatric: Judgment intact, Mood & affect appropriate for pt's clinical situation  Dermatologic: See M/S exam for extremity exam, no rashes otherwise noted  Lymph : No Cervical, Axillary, or Inguinal lymphadenopathy   Non-Invasive Vascular Imaging  Vein Mapping  (Date: 02/23/12):   R arm: acceptable vein conduits include upper arm cephalic vein, lower arm cephalic vein marginally, basilic vein  L arm: acceptable vein conduits include upper arm cephalic and basilic veins are marginal  Outside Studies/Documentation 6 pages of outside documents were reviewed including: outpatient nephrology clinic charts.  Medical Decision Making  Miranda [name] is a 78 y.o. female who presents with chronic kidney disease stage IV  Based on vein mapping and examination, this patient's permanent access options include: R RC vs BC AVF (RC less likely), possible BVT, possible brachial vein transposition; L arm options are less likely though might be possible.  I would proceed with R arm fistula placement first.  I had an extensive discussion with this patient in regards to the nature of access surgery, including risk, benefits, and alternatives.    The patient is aware that the risks of access surgery include but are not limited to: bleeding, infection, steal syndrome, nerve damage,  ischemic monomelic neuropathy, failure of access to mature, and possible need for additional access procedures in the future.  The patient has agreed to proceed with the above procedure which will be scheduled: 3 JUL 13.  Leonides Sake, MD [facility] [place] Office: [phone] Pager: [phone]  02/23/2012, 1:29 PM

## 2012-02-28 NOTE — Transfer of Care (Signed)
Immediate Anesthesia Transfer of Care Note  Patient: Miranda [name]  Procedure(s) Performed: Procedure(s) (LRB): ARTERIOVENOUS (AV) FISTULA CREATION (Right)  Patient Location: PACU  Anesthesia Type: MAC  Level of Consciousness: awake, alert  and oriented  Airway & Oxygen Therapy: Patient Spontanous Breathing and Patient connected to nasal cannula oxygen  Post-op Assessment: Report given to PACU RN, Post -op Vital signs reviewed and stable and Patient moving all extremities  Post vital signs: Reviewed and stable  Complications: No apparent anesthesia complications

## 2012-02-29 NOTE — Anesthesia Postprocedure Evaluation (Signed)
  Anesthesia Post-op Note  Patient: Miranda [name]  Procedure(s) Performed: Procedure(s) (LRB): ARTERIOVENOUS (AV) FISTULA CREATION (Right)  Patient Location: PACU  Anesthesia Type: MAC  Level of Consciousness: awake  Airway and Oxygen Therapy: Patient Spontanous Breathing  Post-op Pain: mild  Post-op Assessment: Post-op Vital signs reviewed  Post-op Vital Signs: Reviewed  Complications: No apparent anesthesia complications

## 2012-03-01 ENCOUNTER — Encounter (HOSPITAL_COMMUNITY): Payer: Self-pay | Admitting: Vascular Surgery

## 2012-03-01 MED FILL — Mupirocin Oint 2%: CUTANEOUS | Qty: 22 | Status: AC

## 2012-03-07 ENCOUNTER — Other Ambulatory Visit: Payer: Self-pay | Admitting: Oncology

## 2012-03-07 ENCOUNTER — Telehealth: Payer: Self-pay | Admitting: *Deleted

## 2012-03-07 ENCOUNTER — Ambulatory Visit (HOSPITAL_BASED_OUTPATIENT_CLINIC_OR_DEPARTMENT_OTHER): Payer: Medicare Other | Admitting: Lab

## 2012-03-07 ENCOUNTER — Ambulatory Visit (HOSPITAL_BASED_OUTPATIENT_CLINIC_OR_DEPARTMENT_OTHER): Payer: Medicare Other

## 2012-03-07 VITALS — BP 124/59 | HR 63 | Temp 97.4°F

## 2012-03-07 DIAGNOSIS — D649 Anemia, unspecified: Secondary | ICD-10-CM

## 2012-03-07 DIAGNOSIS — D631 Anemia in chronic kidney disease: Secondary | ICD-10-CM

## 2012-03-07 DIAGNOSIS — N189 Chronic kidney disease, unspecified: Secondary | ICD-10-CM

## 2012-03-07 LAB — CBC WITH DIFFERENTIAL/PLATELET
Basophils Absolute: 0.1 10*3/uL (ref 0.0–0.1)
EOS%: 2.4 % (ref 0.0–7.0)
Eosinophils Absolute: 0.3 10*3/uL (ref 0.0–0.5)
HGB: 8.4 g/dL — ABNORMAL LOW (ref 11.6–15.9)
LYMPH%: 3 % — ABNORMAL LOW (ref 14.0–49.7)
MCH: 29.5 pg (ref 25.1–34.0)
MCV: 93.9 fL (ref 79.5–101.0)
MONO%: 6.3 % (ref 0.0–14.0)
NEUT#: 10.8 10*3/uL — ABNORMAL HIGH (ref 1.5–6.5)
NEUT%: 87.7 % — ABNORMAL HIGH (ref 38.4–76.8)
Platelets: 356 10*3/uL (ref 145–400)
RDW: 19.4 % — ABNORMAL HIGH (ref 11.2–14.5)

## 2012-03-07 MED ORDER — DARBEPOETIN ALFA-POLYSORBATE 300 MCG/0.6ML IJ SOLN
300.0000 ug | Freq: Once | INTRAMUSCULAR | Status: AC
  Administered 2012-03-07: 300 ug via SUBCUTANEOUS
  Filled 2012-03-07: qty 0.6

## 2012-03-07 NOTE — Telephone Encounter (Signed)
Gave patient appointment for 03-21-2012 starting at 12:00pm

## 2012-03-07 NOTE — Progress Notes (Signed)
Hematology and Oncology Follow Up Visit  Miranda [name] [id number] [birth year] 78 y.o. 03/07/2012    HPI: Patient is an 78 year old [place] woman with a history of: 1. DCIS of the right breast, s/p right lumpectomy with sentinel node dissection, s/p XRT completed 2004. On observation alone. 2. Anemia related to chronic renal insufficiency, Aranesp 300 mcg subcutaneous injection on a every 3-4 week basis. 3. History of gout. 4. History of chronic osteoarthritis.  Interim History:   I saw the patient today. She is more symptomatic with severe fatigue. She is also developed lower extremity edema. Her renal status has worsened and she has now had a fistula placed last week. Her mammogram is drifted down and today is 8.4. She is always cold as well. She denies any bleeding or rales.  A detailed review of systems is otherwise noncontributory as noted below.  Review of Systems: Constitutional:  Fatigue weakness and depression Eyes: no complaints ENT: no complaints Cardiovascular: no chest pain or dyspnea on exertion Respiratory dyspnea on exertion Neurological: no TIA or stroke symptoms Dermatological: negative Gastrointestinal: no abdominal pain, change in bowel habits, or black or bloody stools Genito-Urinary: no dysuria, trouble voiding, or hematuria Hematological and Lymphatic: negative Breast: negative Musculoskeletal: negative Remaining ROS negative.   Medications:   I have reviewed the patient's current medications.  Current Outpatient Prescriptions  Medication Sig Dispense Refill  . acetaminophen (TYLENOL) 500 MG tablet Take 500 mg by mouth every 6 (six) hours as needed. For pain      . allopurinol (ZYLOPRIM) 100 MG tablet Take 100 mg by mouth 2 (two) times daily.      Marland Kitchen amLODipine (NORVASC) 10 MG tablet Take 10 mg by mouth daily.        Marland Kitchen aspirin EC 81 MG tablet Take 81 mg by mouth daily.      Marland Kitchen atorvastatin (LIPITOR) 40 MG tablet Take 40 mg by mouth daily.         . calcitRIOL (ROCALTROL) 0.25 MCG capsule Take 0.25 mcg by mouth daily.        . calcium carbonate (OS-CAL) 600 MG TABS Take 600 mg by mouth 2 (two) times daily with a meal.        . glucosamine-chondroitin 500-400 MG tablet Take 1 tablet by mouth 3 (three) times daily.      Marland Kitchen labetalol (NORMODYNE) 100 MG tablet Take 200 mg by mouth daily.       . multivitamin-iron-minerals-folic acid (CENTRUM) chewable tablet Chew 1 tablet by mouth daily.        Marland Kitchen oxyCODONE (ROXICODONE) 5 MG immediate release tablet Take 1 tablet (5 mg total) by mouth every 4 (four) hours as needed for pain.  30 tablet  0  . pantoprazole (PROTONIX) 40 MG tablet Take 40 mg by mouth daily.        . predniSONE (DELTASONE) 5 MG tablet Take 5 mg by mouth daily.      . SODIUM BICARBONATE, ANTACID, PO Take 1 tablet by mouth 2 (two) times daily. Solu tab dissolved in water       No current facility-administered medications for this visit.   Facility-Administered Medications Ordered in Other Visits  Medication Dose Route Frequency Provider Last Rate Last Dose  . darbepoetin (ARANESP) injection 300 mcg  300 mcg Subcutaneous Once Pierce Crane, MD   300 mcg at 03/07/12 1159    Allergies:  Allergies  Allergen Reactions  . Sulfa Antibiotics Other (See Comments)    Unknown.  "Long  time ago"    Physical Exam: There were no vitals filed for this visit.  There is no height or weight on file to calculate BMI. Weight: 148 lbs. HEENT:  She has a past conjunctival hemorrhage on the right as well as a ecchymoses around her right eye Lungs:  Clear to auscultation bilaterally.  No crackles, rhonchi, or wheezes.   Heart:  Regular rate and rhythm.   Neuro:  Nonfocal, alert and oriented x 3.   Lab Results: Lab Results  Component Value Date   WBC 12.3* 03/07/2012   HGB 8.4* 03/07/2012   HCT 26.8* 03/07/2012   MCV 93.9 03/07/2012   PLT 356 03/07/2012   NEUTROABS 10.8* 03/07/2012     Chemistry      Component Value Date/Time   NA 134*  02/28/2012 1115   K 5.6* 02/28/2012 1115   CL 101 02/28/2012 1115   CO2 18* 02/28/2012 1115   BUN 68* 02/28/2012 1115   CREATININE 4.28* 02/28/2012 1115      Component Value Date/Time   CALCIUM 8.8 02/28/2012 1115   ALKPHOS 81 01/08/2012 1047   AST 29 01/08/2012 1047   ALT 18 01/08/2012 1047   BILITOT 0.4 01/08/2012 1047      Lab Results  Component Value Date   LABCA2 34 12/29/2009    Assessment:  Patient is an 78 year old [place] woman with a history of: 1. DCIS of the right breast, s/p right lumpectomy with sentinel node dissection, s/p XRT completed 2004. On observation alone. 2. Anemia related to chronic renal insufficiency, Aranesp 300 mcg subcutaneous injection on a every 3-4 week basis. 3. History of gout. 4. History of chronic osteoarthritis.    Plan:  The patient is developing worsening renal insufficiency. I have discussed this with her. We will increase or frequency of addressed every 2 weeks. I will check her hemoglobin next week. If she becomes more symptomatic she may benefit from a transfusion. She has started Lasix today because of her lower extremity edema and she might require more Lasix when she receives a transfusion.   Pierce Crane, MD 03/07/2012

## 2012-03-07 NOTE — Telephone Encounter (Signed)
patient confirmed over the phone the new date and time for lab and injection

## 2012-03-08 LAB — IRON AND TIBC: %SAT: 7 % — ABNORMAL LOW (ref 20–55)

## 2012-03-08 LAB — FERRITIN: Ferritin: 892 ng/mL — ABNORMAL HIGH (ref 10–291)

## 2012-03-11 ENCOUNTER — Other Ambulatory Visit: Payer: Self-pay | Admitting: *Deleted

## 2012-03-11 ENCOUNTER — Telehealth: Payer: Self-pay | Admitting: *Deleted

## 2012-03-11 NOTE — Telephone Encounter (Signed)
Dr. Caryn Section called requesting TIBC and ferritin levels to determine how to proceed with patient's dialysis.  Results given with call and faxed at his request to [phone].

## 2012-03-12 ENCOUNTER — Encounter (HOSPITAL_COMMUNITY): Payer: Self-pay | Admitting: Anesthesiology

## 2012-03-12 ENCOUNTER — Inpatient Hospital Stay (HOSPITAL_COMMUNITY): Payer: Medicare Other

## 2012-03-12 ENCOUNTER — Encounter (HOSPITAL_COMMUNITY)
Admission: RE | Disposition: A | Discharge status: Skilled Nursing Facility | Payer: Self-pay | Source: Ambulatory Visit | Attending: Nephrology

## 2012-03-12 ENCOUNTER — Ambulatory Visit (HOSPITAL_COMMUNITY): Payer: Medicare Other

## 2012-03-12 ENCOUNTER — Encounter (HOSPITAL_COMMUNITY): Payer: Self-pay

## 2012-03-12 ENCOUNTER — Inpatient Hospital Stay (HOSPITAL_COMMUNITY)
Admission: RE | Admit: 2012-03-12 | Discharge: 2012-03-19 | DRG: 682 | Disposition: A | Discharge status: Skilled Nursing Facility | Payer: Medicare Other | Source: Ambulatory Visit | Attending: Nephrology | Admitting: Nephrology

## 2012-03-12 ENCOUNTER — Ambulatory Visit (HOSPITAL_COMMUNITY): Payer: Medicare Other | Admitting: Anesthesiology

## 2012-03-12 ENCOUNTER — Encounter (HOSPITAL_COMMUNITY): Payer: Self-pay | Admitting: *Deleted

## 2012-03-12 DIAGNOSIS — J90 Pleural effusion, not elsewhere classified: Secondary | ICD-10-CM | POA: Diagnosis present

## 2012-03-12 DIAGNOSIS — D649 Anemia, unspecified: Secondary | ICD-10-CM | POA: Diagnosis present

## 2012-03-12 DIAGNOSIS — E8779 Other fluid overload: Secondary | ICD-10-CM | POA: Diagnosis present

## 2012-03-12 DIAGNOSIS — D631 Anemia in chronic kidney disease: Secondary | ICD-10-CM

## 2012-03-12 DIAGNOSIS — I359 Nonrheumatic aortic valve disorder, unspecified: Secondary | ICD-10-CM | POA: Diagnosis present

## 2012-03-12 DIAGNOSIS — M109 Gout, unspecified: Secondary | ICD-10-CM | POA: Diagnosis present

## 2012-03-12 DIAGNOSIS — I120 Hypertensive chronic kidney disease with stage 5 chronic kidney disease or end stage renal disease: Principal | ICD-10-CM | POA: Diagnosis present

## 2012-03-12 DIAGNOSIS — N185 Chronic kidney disease, stage 5: Secondary | ICD-10-CM

## 2012-03-12 DIAGNOSIS — N186 End stage renal disease: Secondary | ICD-10-CM | POA: Diagnosis present

## 2012-03-12 DIAGNOSIS — J811 Chronic pulmonary edema: Secondary | ICD-10-CM | POA: Diagnosis present

## 2012-03-12 DIAGNOSIS — Z992 Dependence on renal dialysis: Secondary | ICD-10-CM

## 2012-03-12 DIAGNOSIS — E7800 Pure hypercholesterolemia, unspecified: Secondary | ICD-10-CM | POA: Diagnosis present

## 2012-03-12 DIAGNOSIS — M1990 Unspecified osteoarthritis, unspecified site: Secondary | ICD-10-CM | POA: Diagnosis present

## 2012-03-12 HISTORY — PX: INSERTION OF DIALYSIS CATHETER: SHX1324

## 2012-03-12 LAB — BASIC METABOLIC PANEL
BUN: 75 mg/dL — ABNORMAL HIGH (ref 6–23)
Calcium: 8.7 mg/dL (ref 8.4–10.5)
GFR calc non Af Amer: 8 mL/min — ABNORMAL LOW (ref >90)
Glucose, Bld: 85 mg/dL (ref 70–99)
Sodium: 137 mEq/L (ref 135–145)

## 2012-03-12 LAB — HEPATITIS B SURFACE ANTIGEN: Hepatitis B Surface Ag: NEGATIVE

## 2012-03-12 SURGERY — INSERTION OF DIALYSIS CATHETER
Anesthesia: Monitor Anesthesia Care | Site: Neck | Wound class: Clean

## 2012-03-12 MED ORDER — SODIUM CHLORIDE 0.9 % IV SOLN
INTRAVENOUS | Status: DC | PRN
  Administered 2012-03-12: 13:00:00 via INTRAVENOUS

## 2012-03-12 MED ORDER — SORBITOL 70 % SOLN: 30.0000 mL | Status: DC | PRN

## 2012-03-12 MED ORDER — NEPRO/CARBSTEADY PO LIQD: 237.0000 mL | Freq: Three times a day (TID) | ORAL | Status: DC | PRN

## 2012-03-12 MED ORDER — PARICALCITOL 5 MCG/ML IV SOLN
2.0000 ug | Freq: Once | INTRAVENOUS | Status: AC
  Administered 2012-03-12: 2 ug via INTRAVENOUS

## 2012-03-12 MED ORDER — HYDROXYZINE HCL 25 MG PO TABS: 25.0000 mg | ORAL_TABLET | Freq: Three times a day (TID) | ORAL | Status: DC | PRN

## 2012-03-12 MED ORDER — SODIUM CHLORIDE 0.9 % IV SOLN
125.0000 mg | Freq: Once | INTRAVENOUS | Status: AC
  Administered 2012-03-12: 125 mg via INTRAVENOUS
  Filled 2012-03-12 (×2): qty 10

## 2012-03-12 MED ORDER — HEPARIN SODIUM (PORCINE) 5000 UNIT/ML IJ SOLN
5000.0000 [IU] | Freq: Three times a day (TID) | INTRAMUSCULAR | Status: DC
  Filled 2012-03-12 (×5): qty 1

## 2012-03-12 MED ORDER — DEXTROSE 5 % IV SOLN
1.5000 g | INTRAVENOUS | Status: AC
  Administered 2012-03-12: 1.5 g via INTRAVENOUS
  Filled 2012-03-12: qty 1.5

## 2012-03-12 MED ORDER — PREDNISONE 5 MG PO TABS
5.0000 mg | ORAL_TABLET | Freq: Every day | ORAL | Status: DC
  Administered 2012-03-12 – 2012-03-19 (×8): 5 mg via ORAL
  Filled 2012-03-12 (×8): qty 1

## 2012-03-12 MED ORDER — DOCUSATE SODIUM 283 MG RE ENEM: 1.0000 | ENEMA | RECTAL | Status: DC | PRN

## 2012-03-12 MED ORDER — LIDOCAINE-PRILOCAINE 2.5-2.5 % EX CREA: 1.0000 "application " | TOPICAL_CREAM | CUTANEOUS | Status: DC | PRN

## 2012-03-12 MED ORDER — PENTAFLUOROPROP-TETRAFLUOROETH EX AERO: 1.0000 "application " | INHALATION_SPRAY | CUTANEOUS | Status: DC | PRN

## 2012-03-12 MED ORDER — DARBEPOETIN ALFA-POLYSORBATE 100 MCG/0.5ML IJ SOLN
100.0000 ug | Freq: Once | INTRAMUSCULAR | Status: AC
  Administered 2012-03-12: 100 ug via INTRAVENOUS
  Filled 2012-03-12: qty 0.5

## 2012-03-12 MED ORDER — HEPARIN SODIUM (PORCINE) 1000 UNIT/ML DIALYSIS
2500.0000 [IU] | INTRAMUSCULAR | Status: DC | PRN
  Filled 2012-03-12: qty 3

## 2012-03-12 MED ORDER — SODIUM CHLORIDE 0.9 % IV SOLN
INTRAVENOUS | Status: DC
  Administered 2012-03-12: 20 mL/h via INTRAVENOUS
  Administered 2012-03-12 – 2012-03-13 (×2): via INTRAVENOUS

## 2012-03-12 MED ORDER — RENA-VITE PO TABS
1.0000 | ORAL_TABLET | Freq: Every day | ORAL | Status: DC
  Administered 2012-03-12 – 2012-03-14 (×3): 1 via ORAL
  Filled 2012-03-12 (×3): qty 1

## 2012-03-12 MED ORDER — CAMPHOR-MENTHOL 0.5-0.5 % EX LOTN
1.0000 "application " | TOPICAL_LOTION | Freq: Three times a day (TID) | CUTANEOUS | Status: DC | PRN
  Filled 2012-03-12: qty 222

## 2012-03-12 MED ORDER — LIDOCAINE-EPINEPHRINE (PF) 1 %-1:200000 IJ SOLN
INTRAMUSCULAR | Status: AC
  Filled 2012-03-12: qty 10

## 2012-03-12 MED ORDER — HEPARIN SODIUM (PORCINE) 1000 UNIT/ML DIALYSIS
1000.0000 [IU] | INTRAMUSCULAR | Status: DC | PRN
  Filled 2012-03-12: qty 1

## 2012-03-12 MED ORDER — HEPARIN SODIUM (PORCINE) 1000 UNIT/ML IJ SOLN
INTRAMUSCULAR | Status: DC | PRN
  Administered 2012-03-12: 1000 [IU] via INTRAVENOUS

## 2012-03-12 MED ORDER — ASPIRIN EC 81 MG PO TBEC
81.0000 mg | DELAYED_RELEASE_TABLET | Freq: Every day | ORAL | Status: DC
  Administered 2012-03-12 – 2012-03-19 (×8): 81 mg via ORAL
  Filled 2012-03-12 (×8): qty 1

## 2012-03-12 MED ORDER — CALCIUM CARBONATE 1250 MG/5ML PO SUSP: 500.0000 mg | Freq: Four times a day (QID) | ORAL | Status: DC | PRN

## 2012-03-12 MED ORDER — HYDROCODONE-ACETAMINOPHEN 5-325 MG PO TABS
1.0000 | ORAL_TABLET | ORAL | Status: DC | PRN
  Administered 2012-03-13 – 2012-03-18 (×6): 1 via ORAL
  Filled 2012-03-12 (×6): qty 1

## 2012-03-12 MED ORDER — POLYETHYLENE GLYCOL 3350 17 G PO PACK
17.0000 g | PACK | Freq: Every day | ORAL | Status: DC | PRN
  Filled 2012-03-12: qty 1

## 2012-03-12 MED ORDER — ATORVASTATIN CALCIUM 40 MG PO TABS
40.0000 mg | ORAL_TABLET | Freq: Every day | ORAL | Status: DC
  Administered 2012-03-13 – 2012-03-19 (×7): 40 mg via ORAL
  Filled 2012-03-12 (×7): qty 1

## 2012-03-12 MED ORDER — DOCUSATE SODIUM 100 MG PO CAPS
100.0000 mg | ORAL_CAPSULE | Freq: Two times a day (BID) | ORAL | Status: DC
  Administered 2012-03-12 – 2012-03-19 (×14): 100 mg via ORAL
  Filled 2012-03-12 (×17): qty 1

## 2012-03-12 MED ORDER — PANTOPRAZOLE SODIUM 40 MG PO TBEC
40.0000 mg | DELAYED_RELEASE_TABLET | Freq: Every day | ORAL | Status: DC
  Administered 2012-03-12 – 2012-03-19 (×8): 40 mg via ORAL
  Filled 2012-03-12 (×7): qty 1

## 2012-03-12 MED ORDER — ZOLPIDEM TARTRATE 5 MG PO TABS: 5.0000 mg | ORAL_TABLET | Freq: Every evening | ORAL | Status: DC | PRN

## 2012-03-12 MED ORDER — HYDROMORPHONE HCL PF 1 MG/ML IJ SOLN: 0.2500 mg | INTRAMUSCULAR | Status: DC | PRN

## 2012-03-12 MED ORDER — OXYCODONE-ACETAMINOPHEN 5-325 MG PO TABS: 1.0000 | ORAL_TABLET | ORAL | Status: AC | PRN

## 2012-03-12 MED ORDER — HEPARIN SODIUM (PORCINE) 1000 UNIT/ML IJ SOLN
2000.0000 [IU] | Freq: Once | INTRAMUSCULAR | Status: AC
  Administered 2012-03-12: 2000 [IU] via INTRAVENOUS

## 2012-03-12 MED ORDER — ACETAMINOPHEN 650 MG RE SUPP: 650.0000 mg | Freq: Four times a day (QID) | RECTAL | Status: DC | PRN

## 2012-03-12 MED ORDER — ALTEPLASE 2 MG IJ SOLR
2.0000 mg | Freq: Once | INTRAMUSCULAR | Status: AC | PRN
  Filled 2012-03-12: qty 2

## 2012-03-12 MED ORDER — SODIUM CHLORIDE 0.9 % IV SOLN: 100.0000 mL | INTRAVENOUS | Status: DC | PRN

## 2012-03-12 MED ORDER — ACETAMINOPHEN 325 MG PO TABS
650.0000 mg | ORAL_TABLET | Freq: Four times a day (QID) | ORAL | Status: DC | PRN
  Administered 2012-03-16: 650 mg via ORAL

## 2012-03-12 MED ORDER — DROPERIDOL 2.5 MG/ML IJ SOLN: 0.6250 mg | INTRAMUSCULAR | Status: DC | PRN

## 2012-03-12 MED ORDER — ACETAMINOPHEN 325 MG PO TABS: 650.0000 mg | ORAL_TABLET | Freq: Four times a day (QID) | ORAL | Status: DC | PRN

## 2012-03-12 MED ORDER — SODIUM CHLORIDE 0.9 % IR SOLN: Status: DC | PRN

## 2012-03-12 MED ORDER — HEPARIN SODIUM (PORCINE) 1000 UNIT/ML IJ SOLN
INTRAMUSCULAR | Status: AC
  Filled 2012-03-12: qty 1

## 2012-03-12 MED ORDER — LIDOCAINE HCL (PF) 1 % IJ SOLN: 5.0000 mL | INTRAMUSCULAR | Status: DC | PRN

## 2012-03-12 MED ORDER — ALLOPURINOL 100 MG PO TABS
100.0000 mg | ORAL_TABLET | Freq: Two times a day (BID) | ORAL | Status: DC
  Administered 2012-03-12 – 2012-03-19 (×14): 100 mg via ORAL
  Filled 2012-03-12 (×15): qty 1

## 2012-03-12 MED ORDER — BISACODYL 10 MG RE SUPP: 10.0000 mg | Freq: Every day | RECTAL | Status: DC | PRN

## 2012-03-12 MED ORDER — ONDANSETRON HCL 4 MG/2ML IJ SOLN: 4.0000 mg | Freq: Four times a day (QID) | INTRAMUSCULAR | Status: DC | PRN

## 2012-03-12 MED ORDER — PROMETHAZINE HCL 25 MG PO TABS: 12.5000 mg | ORAL_TABLET | Freq: Four times a day (QID) | ORAL | Status: DC | PRN

## 2012-03-12 MED ORDER — SODIUM CHLORIDE 0.9 % IR SOLN
Status: DC | PRN
  Administered 2012-03-12: 14:00:00

## 2012-03-12 MED ORDER — PARICALCITOL 5 MCG/ML IV SOLN
INTRAVENOUS | Status: AC
  Administered 2012-03-12: 2 ug via INTRAVENOUS
  Filled 2012-03-12: qty 1

## 2012-03-12 MED ORDER — SERTRALINE HCL 50 MG PO TABS
50.0000 mg | ORAL_TABLET | Freq: Every day | ORAL | Status: DC
  Administered 2012-03-12 – 2012-03-19 (×8): 50 mg via ORAL
  Filled 2012-03-12 (×8): qty 1

## 2012-03-12 MED ORDER — ONDANSETRON HCL 4 MG PO TABS: 4.0000 mg | ORAL_TABLET | Freq: Four times a day (QID) | ORAL | Status: DC | PRN

## 2012-03-12 MED ORDER — DARBEPOETIN ALFA-POLYSORBATE 100 MCG/0.5ML IJ SOLN
INTRAMUSCULAR | Status: AC
  Administered 2012-03-12: 100 ug via INTRAVENOUS
  Filled 2012-03-12: qty 0.5

## 2012-03-12 MED ORDER — LIDOCAINE-EPINEPHRINE (PF) 1 %-1:200000 IJ SOLN
INTRAMUSCULAR | Status: DC | PRN
  Administered 2012-03-12: 30 mL

## 2012-03-12 MED ORDER — FENTANYL CITRATE 0.05 MG/ML IJ SOLN
INTRAMUSCULAR | Status: DC | PRN
  Administered 2012-03-12: 50 ug via INTRAVENOUS

## 2012-03-12 SURGICAL SUPPLY — 48 items
ADH SKN CLS LQ APL DERMABOND (GAUZE/BANDAGES/DRESSINGS) ×1
BAG DECANTER FOR FLEXI CONT (MISCELLANEOUS) ×2 IMPLANT
CATH CANNON HEMO 15F 50CM (CATHETERS) IMPLANT
CATH CANNON HEMO 15FR 19 (HEMODIALYSIS SUPPLIES) ×1 IMPLANT
CATH CANNON HEMO 15FR 23CM (HEMODIALYSIS SUPPLIES) IMPLANT
CATH CANNON HEMO 15FR 31CM (HEMODIALYSIS SUPPLIES) IMPLANT
CATH CANNON HEMO 15FR 32 (HEMODIALYSIS SUPPLIES) IMPLANT
CATH CANNON HEMO 15FR 32CM (HEMODIALYSIS SUPPLIES) IMPLANT
CHLORAPREP W/TINT 26ML (MISCELLANEOUS) ×2 IMPLANT
CLOTH BEACON ORANGE TIMEOUT ST (SAFETY) ×2 IMPLANT
COVER PROBE W GEL 5X96 (DRAPES) IMPLANT
COVER SURGICAL LIGHT HANDLE (MISCELLANEOUS) ×2 IMPLANT
DERMABOND ADHESIVE PROPEN (GAUZE/BANDAGES/DRESSINGS) ×1
DERMABOND ADVANCED .7 DNX6 (GAUZE/BANDAGES/DRESSINGS) IMPLANT
DRAPE C-ARM 42X72 X-RAY (DRAPES) ×2 IMPLANT
DRAPE CHEST BREAST 15X10 FENES (DRAPES) ×2 IMPLANT
GAUZE SPONGE 2X2 8PLY STRL LF (GAUZE/BANDAGES/DRESSINGS) ×1 IMPLANT
GAUZE SPONGE 4X4 16PLY XRAY LF (GAUZE/BANDAGES/DRESSINGS) ×2 IMPLANT
GLOVE BIO SURGEON STRL SZ7.5 (GLOVE) ×2 IMPLANT
GLOVE BIOGEL PI IND STRL 7.0 (GLOVE) IMPLANT
GLOVE BIOGEL PI IND STRL 7.5 (GLOVE) ×1 IMPLANT
GLOVE BIOGEL PI INDICATOR 7.0 (GLOVE) ×1
GLOVE BIOGEL PI INDICATOR 7.5 (GLOVE) ×2
GLOVE SURG SS PI 7.5 STRL IVOR (GLOVE) ×1 IMPLANT
GOWN PREVENTION PLUS XLARGE (GOWN DISPOSABLE) ×1 IMPLANT
GOWN STRL NON-REIN LRG LVL3 (GOWN DISPOSABLE) ×3 IMPLANT
KIT BASIN OR (CUSTOM PROCEDURE TRAY) ×2 IMPLANT
KIT ROOM TURNOVER OR (KITS) ×2 IMPLANT
NDL 18GX1X1/2 (RX/OR ONLY) (NEEDLE) ×1 IMPLANT
NDL HYPO 25GX1X1/2 BEV (NEEDLE) ×1 IMPLANT
NEEDLE 18GX1X1/2 (RX/OR ONLY) (NEEDLE) ×2 IMPLANT
NEEDLE 22X1 1/2 (OR ONLY) (NEEDLE) ×2 IMPLANT
NEEDLE HYPO 25GX1X1/2 BEV (NEEDLE) ×2 IMPLANT
NS IRRIG 1000ML POUR BTL (IV SOLUTION) ×2 IMPLANT
PACK SURGICAL SETUP 50X90 (CUSTOM PROCEDURE TRAY) ×2 IMPLANT
PAD ARMBOARD 7.5X6 YLW CONV (MISCELLANEOUS) ×4 IMPLANT
SPONGE GAUZE 2X2 STER 10/PKG (GAUZE/BANDAGES/DRESSINGS) ×1
SUT ETHILON 3 0 PS 1 (SUTURE) ×2 IMPLANT
SUT VICRYL 4-0 PS2 18IN ABS (SUTURE) ×2 IMPLANT
SYR 20CC LL (SYRINGE) ×4 IMPLANT
SYR 30ML LL (SYRINGE) IMPLANT
SYR 5ML LL (SYRINGE) ×4 IMPLANT
SYR CONTROL 10ML LL (SYRINGE) ×2 IMPLANT
SYRINGE 10CC LL (SYRINGE) ×2 IMPLANT
TAPE CLOTH SURG 4X10 WHT LF (GAUZE/BANDAGES/DRESSINGS) ×1 IMPLANT
TOWEL OR 17X24 6PK STRL BLUE (TOWEL DISPOSABLE) ×2 IMPLANT
TOWEL OR 17X26 10 PK STRL BLUE (TOWEL DISPOSABLE) ×2 IMPLANT
WATER STERILE IRR 1000ML POUR (IV SOLUTION) ×2 IMPLANT

## 2012-03-12 NOTE — Transfer of Care (Signed)
Immediate Anesthesia Transfer of Care Note  Patient: Miranda [name]  Procedure(s) Performed: Procedure(s) (LRB): INSERTION OF DIALYSIS CATHETER (N/A)  Patient Location: PACU  Anesthesia Type: MAC  Level of Consciousness: awake, alert  and oriented  Airway & Oxygen Therapy: Patient Spontanous Breathing and Patient connected to nasal cannula oxygen  Post-op Assessment: Report given to PACU RN and Post -op Vital signs reviewed and stable  Post vital signs: Reviewed and stable  Complications: No apparent anesthesia complications

## 2012-03-12 NOTE — H&P (View-Only) (Signed)
[facility] [place]  Referred by:  James Charles Osborne, MD [address], [facility], [address] [place], [place] [address]  Reason for referral: New access  History of Present Illness  Miranda [name] is a 78 y.o. (06/03/1922) female who presents for evaluation for permanent access.  The patient is right hand dominant.  The patient has not had previous access procedures.  Previous central venous cannulation procedures include: none.  The patient has never had a PPM placed.   Past Medical History  Diagnosis Date  . Chronic kidney disease   HTN DJD Gout Spinal stenosis Hypercholesterolemia   Past Surgical History  Procedure Date  . Parathyroidectomy   . Knee surgery   . Breast lumpectomy     left  s/p perineal SCC exc S/p R lower parathyroidectomy  History   Social History  . Marital Status: Married    Spouse Name: N/A    Number of Children: N/A  . Years of Education: N/A   Occupational History  . Not on file.   Social History Main Topics  . Smoking status: Never Smoker   . Smokeless tobacco: Never Used  . Alcohol Use: No  . Drug Use: No  . Sexually Active: Not on file   Other Topics Concern  . Not on file   Social History Narrative  . No narrative on file    Family History  Problem Relation Age of Onset  . Hypertension Mother     Current Outpatient Prescriptions on File Prior to Visit  Medication Sig Dispense Refill  . acetaminophen (TYLENOL) 500 MG tablet Take 500 mg by mouth every 6 (six) hours as needed.        . allopurinol (ZYLOPRIM) 300 MG tablet Take 100 mg by mouth 2 (two) times daily.       . amLODipine (NORVASC) 10 MG tablet Take 10 mg by mouth daily.        . aspirin 81 MG tablet Take 81 mg by mouth daily.        . atorvastatin (LIPITOR) 40 MG tablet Take 40 mg by mouth daily.        . calcitRIOL (ROCALTROL) 0.25 MCG capsule Take 0.25 mcg by mouth daily.        . calcium carbonate  (OS-CAL) 600 MG TABS Take 600 mg by mouth 2 (two) times daily with a meal.        . glucosamine-chondroitin 500-400 MG tablet Take 1 tablet by mouth 3 (three) times daily.      . labetalol (NORMODYNE) 100 MG tablet Take 100 mg by mouth 2 (two) times daily.        . multivitamin-iron-minerals-folic acid (CENTRUM) chewable tablet Chew 1 tablet by mouth daily.        . pantoprazole (PROTONIX) 40 MG tablet Take 40 mg by mouth daily.        . prednisoLONE 5 MG TABS Take by mouth.        . Ketoprofen POWD Compound into a 20% gel, and apply to affected area 3 times a day.  100 g  3    No Known Allergies  REVIEW OF SYSTEMS:  (Positives indicated with an "x", otherwise negative)  CARDIOVASCULAR: [ ] chest pain    [ ] chest pressure    [ ] palpitations    [ ] orthopnea   [ ] dyspnea on exert. [ ] claudication    [ ] rest pain     [ ]   DVT     [ ] phlebitis  PULMONARY:    [ ] productive cough [ ] asthma  [ ] wheezing  NEUROLOGIC:    [ ] weakness    [ ] paresthesias   [ ] aphasia    [ ] amaurosis    [ ] dizziness  HEMATOLOGIC:    [ ] bleeding problems  [ ] clotting disorders  MUSCULOSKEL: [ ] joint pain     [ ] joint swelling  GASTROINTEST:  [ ]  blood in stool   [ ]  hematemesis  GENITOURINARY:   [ ]  dysuria    [ ]  hematuria  PSYCHIATRIC:   [ ] history of major depression  INTEGUMENTARY: [ ] rashes    [ ] ulcers  CONSTITUTIONAL:  [ ] fever     [ ] chills  Physical Examination  Filed Vitals:   02/23/12 1006  BP: 147/54  Pulse: 58  Temp: 99 F (37.2 C)  TempSrc: Oral  Height: 5' 1" (1.549 m)  Weight: 155 lb (70.308 kg)  SpO2: 96%   Body mass index is 29.29 kg/(m^2).  General: A&O x 3, elderly  Head: [place]/AT  Ear/Nose/Throat: Hearing grossly intact, nares w/o erythema or drainage, oropharynx w/o Erythema/Exudate  Eyes: PERRLA, EOMI, R eye with hemorrhage in anterior chamber  Neck: Supple, no nuchal rigidity, no palpable LAD  Pulmonary: Sym exp, good air movt, CTAB, no  rales, rhonchi, & wheezing  Cardiac: RRR, Nl S1, S2, no rubs or gallops, HSM murmur heard best in aortic listening area  Vascular: Vessel Right Left  Radial Palpable Palpable  Brachial Palpable Palpable  Carotid Palpable, without bruit Palpable, without bruit  Aorta Non-palpable N/A  Femoral Palpable Palpable  Popliteal Non-palpable Non-palpable  PT Non-Palpable Non-Palpable  DP Non-Palpable Non-Palpable   Gastrointestinal: soft, NTND, -G/R, - HSM, - masses, - CVAT B  Musculoskeletal: M/S 5/5 in upper extremities, Upper extremities without ischemic changes   Neurologic: CN 2-12 intact , Pain and light touch intact in extremities ,  Motor exam as listed above  Psychiatric: Judgment intact, Mood & affect appropriate for pt's clinical situation  Dermatologic: See M/S exam for extremity exam, no rashes otherwise noted  Lymph : No Cervical, Axillary, or Inguinal lymphadenopathy   Non-Invasive Vascular Imaging  Vein Mapping  (Date: 02/23/12):   R arm: acceptable vein conduits include upper arm cephalic vein, lower arm cephalic vein marginally, basilic vein  L arm: acceptable vein conduits include upper arm cephalic and basilic veins are marginal  Outside Studies/Documentation 6 pages of outside documents were reviewed including: outpatient nephrology clinic charts.  Medical Decision Making  Miranda [name] is a 78 y.o. female who presents with chronic kidney disease stage IV  Based on vein mapping and examination, this patient's permanent access options include: R RC vs BC AVF (RC less likely), possible BVT, possible brachial vein transposition; L arm options are less likely though might be possible.  I would proceed with R arm fistula placement first.  I had an extensive discussion with this patient in regards to the nature of access surgery, including risk, benefits, and alternatives.    The patient is aware that the risks of access surgery include but are not limited to:  bleeding, infection, steal syndrome, nerve damage, ischemic monomelic neuropathy, failure of access to mature, and possible need for additional access procedures in the future.  The patient has agreed to proceed with the above procedure which will be scheduled: 3 JUL   13.  Brian Chen, MD [facility] [place] Office: [phone] Pager: [phone]  02/23/2012, 1:29 PM     

## 2012-03-12 NOTE — Anesthesia Postprocedure Evaluation (Signed)
Anesthesia Post Note  Patient: Miranda [name]  Procedure(s) Performed: Procedure(s) (LRB): INSERTION OF DIALYSIS CATHETER (N/A)  Anesthesia type: MAC  Patient location: PACU  Post pain: Pain level controlled and Adequate analgesia  Post assessment: Post-op Vital signs reviewed, Patient's Cardiovascular Status Stable and Respiratory Function Stable  Last Vitals:  Filed Vitals:   03/12/12 1445  BP: 155/54  Pulse: 88  Temp: 36.4 C  Resp: 21    Post vital signs: Reviewed and stable  Level of consciousness: awake, alert  and oriented  Complications: No apparent anesthesia complications

## 2012-03-12 NOTE — H&P (Signed)
Miranda [name]  Cc: progressively increasing DOE and Hypoxia with planned initiation of HD today at [facility]   ZOX:WRUE Miranda [name] is a 78 y.o. 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 Marina Gravel since 12/2004 and when seen recently in the office on 03/11/12 complained of progressively increasing shortness of breath with lower extremity edema and uremic symptoms. It was then determined that she had reached ESRD and the need to initiate hemodialysis was made. She was scheduled to have a tunneled HD cath placed today and her first outpatient HD treatment at [facility]. Unfortunately, she was hypoxic and with a CXR revealing increasing pulmonary vascular congestion, she was admitted for urgent hemodialysis today and again tomorrow. Correction of her hypoxia with fluid removal will determine her length of stay and she will then be discharged to resume additional volume removal at the outpatient HD unit.   Dialysis Orders: Center: [facility] on TTS. Dr. Eliott Nine EDW 66jkg HD Bath 2K/2.25Ca+ Time 2hrs x 1 wk, 3hr x1 wk, then 3.5hrs Heparin tight. Access RIJ PC; maturing RUA AVF BFR 150-400 DFR A1.5  Zemplar 2 mcg IV/HD Epogen 15,000 Units IV/HD  Venofer  100mg  IV qtx x 10 Other   Past Medical History  Diagnosis Date  . Chronic kidney disease   . Hypertension   . Heart murmur     Dr Particia Lather follows. Not followed by a Cardiologist  . Cancer     Breast- Lumpectomy.  RadiationTx  . Anemia   . Gout    Past Surgical History  Procedure Date  . Parathyroidectomy   . Knee surgery     Right arthroscopy  . Breast lumpectomy     left  . Eye surgery     Cataract eyes  . Appendectomy   . Av fistula placement 02/28/2012    Procedure: ARTERIOVENOUS (AV) FISTULA CREATION;  Surgeon: Fransisco Hertz, MD;  Location: [facility] OR;  Service: Vascular;  Laterality: Right;  Ultrasound guided.   Family History    Problem Relation Age of Onset  . Hypertension Mother     reports that she has never smoked. She has never used smokeless tobacco. She reports that she does not drink alcohol or use illicit drugs. Allergies  Allergen Reactions  . Sulfa Antibiotics Other (See Comments)    Unknown.  "Long time ago"   Prior to Admission medications   Medication Sig Start Date End Date Taking? Authorizing Provider  acetaminophen (TYLENOL) 500 MG tablet Take 500 mg by mouth every 6 (six) hours as needed. For pain   Yes Historical Provider, MD  allopurinol (ZYLOPRIM) 100 MG tablet Take 100 mg by mouth 2 (two) times daily.   Yes Historical Provider, MD  amLODipine (NORVASC) 10 MG tablet Take 10 mg by mouth daily.    Yes Historical Provider, MD  aspirin EC 81 MG tablet Take 81 mg by mouth daily.   Yes Historical Provider, MD  atorvastatin (LIPITOR) 40 MG tablet Take 40 mg by mouth daily.    Yes Historical Provider, MD  calcium carbonate (OS-CAL) 600 MG TABS Take 600 mg by mouth 2 (two) times daily with a meal.    Yes Historical Provider, MD  furosemide (LASIX) 40 MG tablet Take 40 mg by mouth daily.   Yes Historical Provider, MD  furosemide (LASIX) 40 MG tablet Take 80 mg by mouth See admin instructions. Taken bid 03/11/12 day before procedure  Yes Historical Provider, MD  glucosamine-chondroitin 500-400 MG tablet Take 1 tablet by mouth 3 (three) times daily.   Yes Historical Provider, MD  HYDROcodone-acetaminophen (VICODIN) 5-500 MG per tablet Take 0.5 tablets by mouth every 6 (six) hours as needed. For pain   Yes Historical Provider, MD  labetalol (NORMODYNE) 100 MG tablet Take 200 mg by mouth daily.    Yes Historical Provider, MD  multivitamin-iron-minerals-folic acid (CENTRUM) chewable tablet Chew 1 tablet by mouth daily.     Yes Historical Provider, MD  pantoprazole (PROTONIX) 40 MG tablet Take 40 mg by mouth daily.    Yes Historical Provider, MD  predniSONE (DELTASONE) 5 MG tablet Take 5 mg by mouth daily.    Yes Historical Provider, MD  sertraline (ZOLOFT) 50 MG tablet Take 50 mg by mouth daily.   Yes Historical Provider, MD  SODIUM BICARBONATE, ANTACID, PO Take 1 tablet by mouth 2 (two) times daily. Solu tab dissolved in water   Yes Historical Provider, MD  sodium polystyrene (KAYEXALATE) 15 GM/60ML suspension Take 30 g by mouth once.   Yes Historical Provider, MD  oxyCODONE-acetaminophen (ROXICET) 5-325 MG per tablet Take 1-2 tablets by mouth every 4 (four) hours as needed for pain. 03/12/12 03/22/12  Chuck Hint, MD   I have reviewed the patient's current medications.  Results for orders placed during the hospital encounter of 03/12/12 (from the past 48 hour(s))  POCT I-STAT 4, (NA,K, GLUC, HGB,HCT)     Status: Abnormal   Collection Time   03/12/12  8:57 AM      Component Value Range Comment   Sodium 135  135 - 145 mEq/Miranda    Potassium 4.0  3.5 - 5.1 mEq/Miranda    Glucose, Bld 76  70 - 99 mg/dL    HCT 16.1 (*) 09.6 - 46.0 %    Hemoglobin 10.2 (*) 12.0 - 15.0 g/dL    EKG: SR (84) on monitor  ROS: DOE; weakness; no appetite; otherwise negative  [name] Exam: Filed Vitals:   03/12/12 1624  BP: 140/64  Pulse: 81  Temp:   Resp: 21     General: elderly, pleasant, appears younger than stated age; comfortable, no obvious distress  HEENT: atraumatic, normocephalic Neck: supple Heart: RRR, S1, S2, 2-3/6 SEM Lungs: Rales throughout bilat, no rhonchi or wheezes Abdomen: soft, NT/ND, +BS Extremities: 2+ BLE edema Neuro: alert, Ox3; poor historian; cranial nerves grossly intact Skin: generalized petechial areas bruising  Dialysis Access: RUA AVF, +T/B; RIJ PC  Assessment/Plan: 1. Volume overload/Hypoxia/pulm edema and R effusion- CXR s/p PC placement with continued bilat pleural effusions and vascular congestion mildly increased; oxygen saturation 85% on room air and currently 93% on 2L 2. ESRD - TTS [facility]; 1st outpatient Tx was scheduled today; admitted with HD 2.5hr/2.5L today and  2.5hr again tomorrow with 2.5L goal. Keep until doing better from volume and uremia standpoint. 3. Vascular Access- maturing RUA AVF placed 02/28/12 Dr. Imogene Burn but not ready for use; s/p placement RIJ PC today Dr. Edilia Bo 4. Hypertension/volume- BP 144/68-84 pre-HD treatment; gentle volume removal today and tomorrow. Admit med list says norvasc and labetalol were both "stopped on 7/15 by MD". Hold for now, observe BP with vol removal and restart as needed.  5. Anemia  - Hgb 8.3 with T-Sat 7% and Ferritin 892 03/07/12 Dr. Donnie Coffin oncology; on Aranesp per oncology; i-Stat Hgb 10.2 today; scheduled to receive 15,000 Epogen; give Aranesp today; start Venofer bolus scheduled x 10 doses; then resume epogen per protocol  6. Metabolic bone disease - on po calcitriol with iPTH 186 in June 2013; Zemplar 7. Nutrition - albumin 3.8 in 5/13 labs; appetite poor; re-evaluate with pre-tx labs tomorrow   8. History Breast Cancer (DCIS)- s/p radiation therapy and plus lumpectomy; Dr. Donnie Coffin oncology following 9. DJD/Gout/spinal Stenosis-Rheumatology following- takes prednisone 5/d, ? Indication. Will continue. 10. Disposition- when clinically improved  Samuel Germany, FNP-C [facility] Pager [phone] 03/12/2012, 4:44 PM  Attending Nephrologist: Dr. Delano Metz  Patient seen and examined and agree with assessment and plan as above with additions as indicated.  Vinson Moselle  MD [employer] [phone] pgr    [phone] cell 03/12/2012, 6:03 PM

## 2012-03-12 NOTE — Anesthesia Procedure Notes (Addendum)
Date/Time: 03/12/2012 2:00 PM Performed by: Garen Lah Oxygen Delivery Method: Nasal cannula   Date/Time: 03/12/2012 2:20 PM Performed by: Para March L Oxygen Delivery Method: Simple face mask

## 2012-03-12 NOTE — Progress Notes (Addendum)
Spoke with Dr Krista Blue re: CXR from 7/3.  Pt states she does not feel any different from that time, although is sometimes SOB but feels like it might just be from anxiety.  Dr Krista Blue was OK with current CXR.   Nurse tech just reported to me that they cannot get O2 sat over 87.  Placed pt on oxygen and spoke again with Dr Krista Blue.  We will repeat CXR today.

## 2012-03-12 NOTE — Procedures (Signed)
I was present at this dialysis session. I have reviewed the session itself and made appropriate changes.   Vinson Moselle, MD [employer] 03/12/2012, 6:09 PM

## 2012-03-12 NOTE — Op Note (Signed)
NAME: Miranda [name]    MRN: [id number] DOB: 09-Oct-1921    DATE OF OPERATION: 03/12/2012  PREOP DIAGNOSIS: stage V chronic kidney disease  POSTOP DIAGNOSIS: same  PROCEDURE: ultrasound guided placement of right IJ Diatek catheter  SURGEON: Di Kindle. Edilia Bo, MD, FACS  ASSIST: none  ANESTHESIA: local with sedation   EBL: minimal  INDICATIONS: [name] is a 78 y.o. female is to begin dialysis today. She has a fistula which is not yet mature and I was asked to place a catheter.  FINDINGS: patent bilateral IJ use.  TECHNIQUE: The patient was brought to the operating room and sedated by anesthesia. The neck and upper chest were prepped and draped in the usual sterile fashion. After the skin was anesthetized, and under ultrasound guidance, the right IJ was cannulated and a guidewire introduced into the superior vena cava under fluoroscopic control. The tract over the wire was dilated and then a dilator and peel-away sheath were advanced over the wire and the wire and dilator were removed. The catheter was passed through the peel-away sheath and positioned into the right atrium. Site the catheter was selected and the skin anesthetized between the 2 areas. The catheter was brought to the tunnel cut to the appropriate length and the distal ports were attached. Both ports withdrew easily and then flushed with heparinized saline and filled with concentrated heparin. Catheter was secured at its exit site with a 3-0 nylon suture. The IJ cannulation site was closed with 4-0 subcuticular stitch. Sterile dressing was applied. The patient tolerated the procedure well and was transferred to the recovery room in stable condition. All needle and sponge counts were correct.  Waverly Ferrari, MD, FACS [facility]  DATE OF DICTATION:   03/12/2012

## 2012-03-12 NOTE — Interval H&P Note (Signed)
History and Physical Interval Note:  03/12/2012 1:45 PM  Miranda [name]  has presented today for surgery, with the diagnosis of ESRD  The various methods of treatment have been discussed with the patient and family. After consideration of risks, benefits and other options for treatment, the patient has consented to: INSERTION OF DIALYSIS CATHETER.  The patient'[name] history has been reviewed, patient examined, no change in status, stable for surgery.  I have reviewed the patients' chart and labs.  Questions were answered to the patient'[name] satisfaction.     Miranda [name]

## 2012-03-12 NOTE — Preoperative (Signed)
Beta Blockers   Reason not to administer Beta Blockers:Hold beta blocker due to other. Was discontinued by medical doctor 03/11/12

## 2012-03-12 NOTE — Anesthesia Preprocedure Evaluation (Addendum)
Anesthesia Evaluation  Patient identified by MRN, date of birth, ID band Patient awake    Reviewed: Allergy & Precautions, H&P , NPO status , Patient's Chart, lab work & pertinent test results, reviewed documented beta blocker date and time   History of Anesthesia Complications Negative for: history of anesthetic complications  Airway Mallampati: II TM Distance: >3 FB Neck ROM: Full    Dental  (+) Dental Advisory Given and Teeth Intact   Pulmonary shortness of breath and with exertion,  RA sat < 90%, increased to 93% on 3l [place]   Pulmonary exam normal       Cardiovascular hypertension, Pt. on medications Rhythm:Regular     Neuro/Psych    GI/Hepatic Neg liver ROS, GERD-  Medicated and Controlled,  Endo/Other    Renal/GU CRF and DialysisRenal disease     Musculoskeletal   Abdominal   Peds  Hematology   Anesthesia Other Findings   Reproductive/Obstetrics                         Anesthesia Physical Anesthesia Plan  ASA: III  Anesthesia Plan: MAC   Post-op Pain Management:    Induction: Intravenous  Airway Management Planned: Simple Face Mask  Additional Equipment:   Intra-op Plan:   Post-operative Plan:   Informed Consent: I have reviewed the patients History and Physical, chart, labs and discussed the procedure including the risks, benefits and alternatives for the proposed anesthesia with the patient or authorized representative who has indicated his/her understanding and acceptance.   Dental advisory given  Plan Discussed with: CRNA, Anesthesiologist and Surgeon  Anesthesia Plan Comments:         Anesthesia Quick Evaluation

## 2012-03-13 ENCOUNTER — Inpatient Hospital Stay (HOSPITAL_COMMUNITY): Payer: Medicare Other

## 2012-03-13 ENCOUNTER — Ambulatory Visit: Payer: Medicare Other | Admitting: Vascular Surgery

## 2012-03-13 LAB — COMPREHENSIVE METABOLIC PANEL
Albumin: 2.2 g/dL — ABNORMAL LOW (ref 3.5–5.2)
BUN: 47 mg/dL — ABNORMAL HIGH (ref 6–23)
Creatinine, Ser: 3.66 mg/dL — ABNORMAL HIGH (ref 0.50–1.10)
Potassium: 3.6 mEq/L (ref 3.5–5.1)
Total Protein: 5.4 g/dL — ABNORMAL LOW (ref 6.0–8.3)

## 2012-03-13 LAB — CBC
HCT: 28.7 % — ABNORMAL LOW (ref 36.0–46.0)
Hemoglobin: 8.9 g/dL — ABNORMAL LOW (ref 12.0–15.0)
MCV: 92.9 fL (ref 78.0–100.0)
RBC: 3.09 MIL/uL — ABNORMAL LOW (ref 3.87–5.11)
WBC: 16.2 10*3/uL — ABNORMAL HIGH (ref 4.0–10.5)

## 2012-03-13 MED ORDER — SODIUM CHLORIDE 0.9 % IV SOLN
125.0000 mg | Freq: Every day | INTRAVENOUS | Status: DC
  Administered 2012-03-13 – 2012-03-19 (×5): 125 mg via INTRAVENOUS
  Filled 2012-03-13 (×12): qty 10

## 2012-03-13 MED ORDER — CALCIUM ACETATE 667 MG PO CAPS
667.0000 mg | ORAL_CAPSULE | Freq: Three times a day (TID) | ORAL | Status: DC
  Administered 2012-03-13 – 2012-03-19 (×16): 667 mg via ORAL
  Filled 2012-03-13 (×22): qty 1

## 2012-03-13 NOTE — Progress Notes (Signed)
Utilization review completed.  

## 2012-03-13 NOTE — Progress Notes (Signed)
Subjective/Objective:  Feeling better this am; seen on HD; tolerated 2.3L off last night and net UF 2.5L again today; O2 sat 88% on 2l initially then ^ 94% on 3L   Vital signs in last 24 hours: Filed Vitals:   03/13/12 0730 03/13/12 0800 03/13/12 0830 03/13/12 0900  BP: 125/58 114/58 120/57 115/56  Pulse: 86 86 85 88  Temp:      TempSrc:      Resp: 20 16 18 18   Height:      Weight:      SpO2:       Weight change:   Intake/Output Summary (Last 24 hours) at 03/13/12 0905 Last data filed at 03/13/12 0700  Gross per 24 hour  Intake 306.33 ml  Output   2376 ml  Net -2069.67 ml   Labs: Basic Metabolic Panel:  Lab 03/13/12 4098 03/12/12 1611 03/12/12 0857  NA 140 137 135  K 3.6 4.1 4.0  CL 101 99 --  CO2 21 20 --  GLUCOSE 75 85 76  BUN 47* 75* --  CREATININE 3.66* 4.65* --  CALCIUM 8.5 8.7 --  ALB -- -- --  PHOS -- 5.0* --   Liver Function Tests:  Lab 03/13/12 0500  AST 22  ALT 14  ALKPHOS 76  BILITOT 0.3  PROT 5.4*  ALBUMIN 2.2*   No results found for this basename: LIPASE:3,AMYLASE:3 in the last 168 hours No results found for this basename: AMMONIA:3 in the last 168 hours CBC:  Lab 03/13/12 0500 03/12/12 0857 03/07/12 1100  WBC 16.2* -- 12.3*  NEUTROABS -- -- 10.8*  HGB 8.9* 10.2* 8.4*  HCT 28.7* 30.0* 26.8*  MCV 92.9 -- 93.9  PLT 326 -- 356   Cardiac Enzymes: No results found for this basename: CKTOTAL:5,CKMB:5,CKMBINDEX:5,TROPONINI:5 in the last 168 hours CBG: No results found for this basename: GLUCAP:5 in the last 168 hours  Iron Studies: No results found for this basename: IRON,TIBC,TRANSFERRIN,FERRITIN in the last 72 hours Studies/Results: Dg Chest 2 View  03/12/2012  *RADIOLOGY REPORT*  Clinical Data: Insertion of dialysis catheter.  CHEST - 2 VIEW  Comparison: 02/28/2012  Findings: No central line or dialysis catheter visualized.  There is hyperinflation of the lungs.  Small bilateral pleural effusions. Heart is borderline in size.  Interstitial  prominence again noted, unchanged, likely interstitial edema.  Bibasilar atelectasis. No pneumothorax.  IMPRESSION: No dialysis catheter visualized.  Suspect mild interstitial edema.  Small effusions with bibasilar atelectasis.  Original Report Authenticated By: Cyndie Chime, M.D.   Dg Chest Port 1 View  03/12/2012  *RADIOLOGY REPORT*  Clinical Data: 78 year old female status post catheter insertion.  PORTABLE CHEST - 1 VIEW  Comparison: Zero 99 the same day and earlier.  Findings: Portable semi upright view 1501 hours.  Dual lumen right IJ dialysis type catheter now in place.  Proximal tip at the level of the carina.  Distal tip of the level of the cavoatrial junction. No pneumothorax.  Mildly improved lung volumes.  Bilateral pleural effusions persist.  Stable cardiac size and mediastinal contours. Pulmonary vascular congestion may be mildly increased.  IMPRESSION: Right IJ dual lumen dialysis catheter placed with no adverse features.  Continued pleural effusions and vascular congestion appears increased.  Original Report Authenticated By: Harley Hallmark, M.D.   Dg Fluoro Guide Cv Line-no Report  03/12/2012  CLINICAL DATA: Dialysis Catheter Placement   FLOURO GUIDE CV LINE  Fluoroscopy was utilized by the requesting physician.  No radiographic  interpretation.  Medications:    . sodium chloride 20 mL/hr at 03/13/12 0700      . allopurinol  100 mg Oral BID  . aspirin EC  81 mg Oral Daily  . atorvastatin  40 mg Oral q1800  . cefUROXime (ZINACEF)  IV  1.5 g Intravenous 30 min Pre-Op  . darbepoetin (ARANESP) injection - DIALYSIS  100 mcg Intravenous Once  . docusate sodium  100 mg Oral BID  . ferric gluconate (FERRLECIT/NULECIT) IV  125 mg Intravenous Once  . heparin  2,000 Units Intravenous Once  . heparin  5,000 Units Subcutaneous Q8H  . multivitamin  1 tablet Oral Daily  . pantoprazole  40 mg Oral Q1200  . paricalcitol  2 mcg Intravenous Once in dialysis  . predniSONE  5 mg Oral  Daily  . sertraline  50 mg Oral Daily    I  have reviewed scheduled and prn medications.  General: comfortable, no obvious distress  Heart: RRR, S1, S2, 2-3/6 SEM  Lungs: still bibasilar rales, but less prominent, no rhonchi or wheezes  Abdomen: soft, NT/ND, +BS  Extremities: 2+ BLE edema, no change  Skin: redness/inflammation LLE w/faint petechial rash ankle Dialysis Access: RUA AVF, +T/B; RIJ PC   Assessment/Plan:  1. Volume overload/Hypoxia/pulm edema and R effusion- CXR s/p PC placement with continued bilat pleural effusions and vascular congestion mildly increased; 2.3L off last night and remained hypoxic on 2L this am; UF goal 2.5 again today with oxygen saturation 88% on 2L as noted above; obtain 2D echo and follow, and repeat 2V CXR 2. ESRD - clipped to TTS [facility]; 2nd HD treatment today; total 4.3L off in 2 days with hypotensive (90/49-88) episode end of treatment; BUN down 47/creat 3.66 this am; await results Echo prior to plans for next treatment. Next HD tomorrow, no heparin.  3. Bleeding at IJ cath site- pressure held 2-3x and bleeding finally stopped. No hep with HD, but is on SQ hep as DVT prophylaxis.  Will d/c SQ hep. Get OOB.  4. Hypokalemia- K level 3.6 this am and changed to 4K bath; follow 5. Leukocytosis- WBC 16.2; afebrile on prednisone; LLE/ankle with redness/no increased warmth and faint petechial rash to ankle area; watch closely for now 6. Vascular Access- maturing RUA AVF placed 02/28/12 Dr. Imogene Burn but not ready for use; RIJ PC placed yesterday Dr. Edilia Bo in use; some oozing around cath site; cont no hep HD 7. Hypertension/volume- tolerating UF with late BP drops. HD daily until pulm edema/hypoxia resolves; cont holding BP meds (Norvasc, Labetalol). Echo ordered. 8. Anemia - Hgb 8.9 this am; Aranesp and iron bolus started yesterday;continue follow protocol  9. Metabolic bone disease - phos 5.0, ca+ 8.5, no binders; add Phoslo 667mg , 1 with meals, continue Zemplar  ; follow ca/phos 10. DJD/Gout/spinal Stenosis-Rheumatology following- on prednisone 5mg /d  11. Disposition- when clinically improved  Samuel Germany, FNP-C [facility] Pager [phone]   Patient seen and examined and agree with assessment and plan as above with additions as indicated.  Vinson Moselle  MD [facility] [phone] pgr    [phone] cell 03/13/2012, 10:35 AM     03/13/2012,9:05 AM  LOS: 1 day

## 2012-03-13 NOTE — Procedures (Signed)
I was present at this dialysis session. I have reviewed the session itself and made appropriate changes.   Vinson Moselle, MD [employer] 03/13/2012, 10:36 AM

## 2012-03-14 ENCOUNTER — Ambulatory Visit: Payer: Medicare Other

## 2012-03-14 ENCOUNTER — Inpatient Hospital Stay (HOSPITAL_COMMUNITY): Payer: Medicare Other

## 2012-03-14 ENCOUNTER — Other Ambulatory Visit: Payer: Medicare Other | Admitting: Lab

## 2012-03-14 LAB — CBC
MCV: 92.4 fL (ref 78.0–100.0)
Platelets: 310 10*3/uL (ref 150–400)
RBC: 3.02 MIL/uL — ABNORMAL LOW (ref 3.87–5.11)
WBC: 17.1 10*3/uL — ABNORMAL HIGH (ref 4.0–10.5)

## 2012-03-14 LAB — URINE MICROSCOPIC-ADD ON

## 2012-03-14 LAB — RENAL FUNCTION PANEL
CO2: 25 mEq/L (ref 19–32)
Chloride: 100 mEq/L (ref 96–112)
Creatinine, Ser: 3.21 mg/dL — ABNORMAL HIGH (ref 0.50–1.10)
GFR calc Af Amer: 14 mL/min — ABNORMAL LOW (ref >90)
GFR calc non Af Amer: 12 mL/min — ABNORMAL LOW (ref >90)

## 2012-03-14 LAB — URINALYSIS, ROUTINE W REFLEX MICROSCOPIC
Nitrite: NEGATIVE
Specific Gravity, Urine: 1.012 (ref 1.005–1.030)
pH: 6 (ref 5.0–8.0)

## 2012-03-14 MED ORDER — VANCOMYCIN HCL 1000 MG IV SOLR
1250.0000 mg | Freq: Once | INTRAVENOUS | Status: AC
  Administered 2012-03-14: 1250 mg via INTRAVENOUS
  Filled 2012-03-14: qty 1250

## 2012-03-14 MED ORDER — RENA-VITE PO TABS
1.0000 | ORAL_TABLET | Freq: Every day | ORAL | Status: DC
  Administered 2012-03-15 – 2012-03-18 (×4): 1 via ORAL
  Filled 2012-03-14 (×5): qty 1

## 2012-03-14 MED ORDER — NEPRO/CARBSTEADY PO LIQD
237.0000 mL | Freq: Three times a day (TID) | ORAL | Status: DC
  Administered 2012-03-14 – 2012-03-19 (×10): 237 mL via ORAL

## 2012-03-14 NOTE — Progress Notes (Signed)
Subjective:  Feeling much better; wants to know when she's going home  Vital signs in last 24 hours: Filed Vitals:   03/14/12 0705 03/14/12 0735 03/14/12 0800 03/14/12 0830  BP: 154/74 147/70 136/66 123/55  Pulse: 81 78 76 75  Temp:      TempSrc:      Resp: 17 20  17   Height:      Weight:      SpO2: 93%      Weight change: 1.3 kg (2 lb 13.9 oz)  Intake/Output Summary (Last 24 hours) at 03/14/12 0904 Last data filed at 03/13/12 2100  Gross per 24 hour  Intake    780 ml  Output   2100 ml  Net  -1320 ml   Labs: Basic Metabolic Panel:  Lab 03/14/12 1610 03/13/12 0500 03/12/12 1611  NA 140 140 137  K 3.1* 3.6 4.1  CL 100 101 99  CO2 25 21 20   GLUCOSE 87 75 85  BUN 34* 47* 75*  CREATININE 3.21* 3.66* 4.65*  CALCIUM 8.6 8.5 8.7  ALB -- -- --  PHOS 4.0 -- 5.0*   Liver Function Tests:  Lab 03/14/12 0708 03/13/12 0500  AST -- 22  ALT -- 14  ALKPHOS -- 76  BILITOT -- 0.3  PROT -- 5.4*  ALBUMIN 2.3* 2.2*   No results found for this basename: LIPASE:3,AMYLASE:3 in the last 168 hours No results found for this basename: AMMONIA:3 in the last 168 hours CBC:  Lab 03/14/12 0708 03/13/12 0500 03/12/12 0857 03/07/12 1100  WBC 17.1* 16.2* -- 12.3*  NEUTROABS -- -- -- 10.8*  HGB 8.6* 8.9* 10.2* --  HCT 27.9* 28.7* 30.0* --  MCV 92.4 92.9 -- 93.9  PLT 310 326 -- 356   Cardiac Enzymes: No results found for this basename: CKTOTAL:5,CKMB:5,CKMBINDEX:5,TROPONINI:5 in the last 168 hours CBG: No results found for this basename: GLUCAP:5 in the last 168 hours  Iron Studies: No results found for this basename: IRON,TIBC,TRANSFERRIN,FERRITIN in the last 72 hours Studies/Results: Dg Chest 2 View  03/13/2012  *RADIOLOGY REPORT*  Clinical Data: Hypoxemia, dialysis patient, shortness of breath  CHEST - 2 VIEW  Comparison: Portable chest x-ray of 03/12/2012  Findings: There has been some improvement in the interstitial edema pattern.  There do appear to be a small pleural effusions  present left slightly larger than right.  Cardiomegaly is stable.  Central venous line is unchanged.  IMPRESSION: Improvement in interstitial edema pattern.  Small effusions.  Original Report Authenticated By: Juline Patch, M.D.   Dg Chest 2 View  03/12/2012  *RADIOLOGY REPORT*  Clinical Data: Insertion of dialysis catheter.  CHEST - 2 VIEW  Comparison: 02/28/2012  Findings: No central line or dialysis catheter visualized.  There is hyperinflation of the lungs.  Small bilateral pleural effusions. Heart is borderline in size.  Interstitial prominence again noted, unchanged, likely interstitial edema.  Bibasilar atelectasis. No pneumothorax.  IMPRESSION: No dialysis catheter visualized.  Suspect mild interstitial edema.  Small effusions with bibasilar atelectasis.  Original Report Authenticated By: Cyndie Chime, M.D.   Dg Chest Port 1 View  03/12/2012  *RADIOLOGY REPORT*  Clinical Data: 78 year old female status post catheter insertion.  PORTABLE CHEST - 1 VIEW  Comparison: Zero 99 the same day and earlier.  Findings: Portable semi upright view 1501 hours.  Dual lumen right IJ dialysis type catheter now in place.  Proximal tip at the level of the carina.  Distal tip of the level of the cavoatrial junction. No pneumothorax.  Mildly improved lung volumes.  Bilateral pleural effusions persist.  Stable cardiac size and mediastinal contours. Pulmonary vascular congestion may be mildly increased.  IMPRESSION: Right IJ dual lumen dialysis catheter placed with no adverse features.  Continued pleural effusions and vascular congestion appears increased.  Original Report Authenticated By: Harley Hallmark, M.D.   Dg Fluoro Guide Cv Line-no Report  03/12/2012  CLINICAL DATA: Dialysis Catheter Placement   FLOURO GUIDE CV LINE  Fluoroscopy was utilized by the requesting physician.  No radiographic  interpretation.     Medications:    . sodium chloride 20 mL/hr at 03/13/12 2202      . allopurinol  100 mg Oral BID    . aspirin EC  81 mg Oral Daily  . atorvastatin  40 mg Oral q1800  . calcium acetate  667 mg Oral TID WC  . docusate sodium  100 mg Oral BID  . ferric gluconate (FERRLECIT/NULECIT) IV  125 mg Intravenous Daily  . multivitamin  1 tablet Oral Daily  . pantoprazole  40 mg Oral Q1200  . predniSONE  5 mg Oral Daily  . sertraline  50 mg Oral Daily  . DISCONTD: heparin  5,000 Units Subcutaneous Q8H    I  have reviewed scheduled and prn medications.  General: comfortable, no obvious distress, weak, frail  Heart: RRR, S1, S2, 2-3/6 SEM  Lungs: rales to bases, and improving, no rhonchi or wheezes  Abdomen: soft, NT/ND, +BS  Extremities: 1-2+ BLE edema, improving   Skin: redness/inflammation LLE w/faint petechial rash ankle, unchanged Dialysis Access: RUA AVF, +T/B; RIJ PC   Assessment/Plan:  1. Volume overload/Hypoxia/pulm edema and R effusion- CXR s/p PC placement with continued bilat pleural effusions and vascular congestion mildly increased; 2.3L off last night and remained hypoxic on 2L this am; UF goal 2.5 again today with oxygen saturation 88% on 2L as noted above; obtain 2D echo and follow, and repeat 2V CXR 2. Cellulitis/leukocytosis- WBC up to 17k, check blood cx's, UA and start IV Vanc for suspected LLE cellulitis.  3. Hypokalemia- K level 3.1; changed 4K bath 4. ESRD - clipped to [facility]; 3rd HD treatment today; uremia resolving; 3.5 goal today; (>7kg off since adm) f/u CXR yesterday with improvement interstitial edema and small effusions; breathing better; Echo pending; next Tx Sat (outpt schedule).  5. Bleeding at IJ cath site- pressure held 2-3x and bleeding finally stopped. No hep with HD, but is on SQ hep as DVT prophylaxis. Will d/c SQ hep. Get OOB. 6. Vascular Access- maturing RUA AVF placed 02/28/12 Dr. Imogene Burn but not ready for use; RIJ PC placed Tu 7/16 Dr. Edilia Bo; no further bleeding around cath site; cont no hep HD 7. Hypertension/volume- holding BP meds (Norvasc, Labetalol).  UF goal 3.5; BP low yesterday; Echo still pending; follow 8. Anemia - Hgb 8.6 this am; Aranesp and iron bolus in progress; follow   9. Metabolic bone disease - phos down 4.0, ca+ 8.6, no binders; added Phoslo 667mg , 1 with meals, continue Zemplar ; follow ca/phos 10. DJD/Gout/spinal Stenosis-Rheumatology following- on prednisone 5mg /d  11. Disposition- deconditioned, will need short-term SNF for rehab most likely. SW, PT and OT consults today. 12. Nutrition- dietician consult re: renal diet ordered.  Samuel Germany, FNP-C [facility] Pager [phone]  03/14/2012,9:04 AM  LOS: 2 days   Patient seen and examined and agree with assessment and plan as above with additions as indicated.  Vinson Moselle  MD [employer] [phone] pgr  [phone] cell 03/14/2012, 12:27 PM

## 2012-03-14 NOTE — Progress Notes (Signed)
*  PRELIMINARY RESULTS* Echocardiogram 2D Echocardiogram has been performed.  Jeryl Columbia 03/14/2012, 4:16 PM

## 2012-03-14 NOTE — Evaluation (Signed)
Physical Therapy Evaluation Patient Details Name: Miranda [name] MRN: [id number] DOB: 1921/11/27 Today's Date: 03/14/2012 Time: 4098-1191 PT Time Calculation (min): 35 min  PT Assessment / Plan / Recommendation Clinical Impression  Pt. was admitted with DOE, volume overload, hypoxia, pulmonary edema and cellulitis L lower leg .  Pt. is new to HD this admission and has decreased functional mobility and gait, decreased activity tolerance as compared to her baseline.  She will benefit from acute PT to address these and below issues.  May also need ST SNF for rehab before return to her independent living arrangements.    PT Assessment  Patient needs continued PT services    Follow Up Recommendations  Skilled nursing facility    Barriers to Discharge Decreased caregiver support      Equipment Recommendations  Defer to next venue    Recommendations for Other Services     Frequency Min 3X/week    Precautions / Restrictions Precautions Precautions: Fall Precaution Comments: pt. incontinent of urine several times when up Restrictions Weight Bearing Restrictions: No   Pertinent Vitals/Pain No pain, no distress      Mobility  Bed Mobility Bed Mobility: Supine to Sit;Sit to Supine Supine to Sit: 4: Min guard;With rails;HOB flat Sit to Supine: Not Tested (comment) Details for Bed Mobility Assistance: vc's for safe technique Transfers Transfers: Sit to Stand;Stand to Sit Sit to Stand: 4: Min assist;From bed Stand to Sit: 4: Min assist;With upper extremity assist;To bed Details for Transfer Assistance: vc's for safety and technique Ambulation/Gait Ambulation/Gait Assistance: 4: Min assist Ambulation Distance (Feet): 15 Feet Assistive device: Rolling walker Ambulation/Gait Assistance Details: min assist for balance and stability Gait Pattern: Step-through pattern General Gait Details: pt. incontinent of urine during walk    Exercises     PT Diagnosis: Difficulty  walking;Generalized weakness  PT Problem List: Decreased strength;Decreased activity tolerance;Decreased mobility;Decreased knowledge of use of DME;Decreased knowledge of precautions PT Treatment Interventions: DME instruction;Gait training;Functional mobility training;Therapeutic activities;Therapeutic exercise;Patient/family education   PT Goals Acute Rehab PT Goals PT Goal Formulation: With patient Time For Goal Achievement: 03/28/12 Potential to Achieve Goals: Good Pt will go Supine/Side to Sit: Independently PT Goal: Supine/Side to Sit - Progress: Goal set today Pt will go Sit to Supine/Side: Independently PT Goal: Sit to Supine/Side - Progress: Goal set today Pt will go Sit to Stand: with modified independence PT Goal: Sit to Stand - Progress: Goal set today Pt will Transfer Bed to Chair/Chair to Bed: with modified independence PT Transfer Goal: Bed to Chair/Chair to Bed - Progress: Goal set today Pt will Ambulate: >150 feet;with modified independence;with least restrictive assistive device PT Goal: Ambulate - Progress: Goal set today  Visit Information  Last PT Received On: 03/14/12 Assistance Needed: +1    Subjective Data  Subjective: Dialysis takes a long time Patient Stated Goal: Walk, go to the bathroom, drive   Prior Functioning  Home Living Lives With: Spouse Available Help at Discharge: Available 24 hours/day Type of Home: Apartment Home Access: Stairs to enter Home Layout: One level Bathroom Shower/Tub: Walk-in Contractor: Handicapped height Home Adaptive Equipment: Straight cane Prior Function Level of Independence: Independent with assistive device(s) Driving: No Vocation: Retired Musician: No difficulties Dominant Hand: Right    Cognition  Overall Cognitive Status: Appears within functional limits for tasks assessed/performed Arousal/Alertness: Awake/alert Orientation Level: Oriented X4 / Intact Behavior During  Session: [facility] for tasks performed    Extremity/Trunk Assessment Right Upper Extremity Assessment RUE ROM/Strength/Tone: [facility] for tasks  assessed Left Upper Extremity Assessment LUE ROM/Strength/Tone: WFL for tasks assessed Right Lower Extremity Assessment RLE ROM/Strength/Tone: WFL for tasks assessed RLE Sensation: WFL - Light Touch Left Lower Extremity Assessment LLE ROM/Strength/Tone: WFL for tasks assessed LLE Sensation: WFL - Light Touch Trunk Assessment Trunk Assessment: Normal   Balance    End of Session PT - End of Session Equipment Utilized During Treatment: Gait belt Activity Tolerance: Patient tolerated treatment well;Patient limited by fatigue Patient left: in bed;with nursing in room Nurse Communication: Mobility status  GP     Ferman Hamming 03/14/2012, 3:44 PM Acute Rehabilitation Services [phone] (pager)

## 2012-03-14 NOTE — Progress Notes (Signed)
ANTIBIOTIC CONSULT NOTE - INITIAL  Pharmacy Consult for Vancomycin Indication: Probable Cellulitis  Allergies  Allergen Reactions  . Sulfa Antibiotics Other (See Comments)    Unknown.  "Long time ago"    Patient Measurements: Height: 5\' 1"  (154.9 cm) Weight: 132 lb 11.5 oz (60.2 kg) IBW/kg (Calculated) : 47.8    Vital Signs: Temp: 98 F (36.7 C) (07/18 1100) Temp src: Oral (07/18 1100) BP: 137/46 mmHg (07/18 1100) Pulse Rate: 85  (07/18 1100) Intake/Output from previous day: 07/17 0701 - 07/18 0700 In: 780 [P.O.:420; I.V.:250; IV Piggyback:110] Out: 2100  Intake/Output from this shift: Total I/O In: 120 [P.O.:120] Out: 2121 [Other:2121]  Labs:  [facility] 03/14/12 0708 03/13/12 0500 03/12/12 1611 03/12/12 0857  WBC 17.1* 16.2* -- --  HGB 8.6* 8.9* -- 10.2*  PLT 310 326 -- --  LABCREA -- -- -- --  CREATININE 3.21* 3.66* 4.65* --   Estimated Creatinine Clearance: 10.3 ml/min (by C-G formula based on Cr of 3.21). No results found for this basename: VANCOTROUGH:2,VANCOPEAK:2,VANCORANDOM:2,GENTTROUGH:2,GENTPEAK:2,GENTRANDOM:2,TOBRATROUGH:2,TOBRAPEAK:2,TOBRARND:2,AMIKACINPEAK:2,AMIKACINTROU:2,AMIKACIN:2, in the last 72 hours   Microbiology: Recent Results (from the past 720 hour(s))  SURGICAL PCR SCREEN     Status: Abnormal   Collection Time   02/28/12  6:30 AM      Component Value Range Status Comment   MRSA, PCR NEGATIVE  NEGATIVE Final    Staphylococcus aureus POSITIVE (*) NEGATIVE Final     Medical History: Past Medical History  Diagnosis Date  . Chronic kidney disease   . Hypertension   . Heart murmur     Dr Particia Lather follows. Not followed by a Cardiologist  . Cancer     Breast- Lumpectomy.  RadiationTx  . Anemia   . Gout     Medications:  Scheduled:    . allopurinol  100 mg Oral BID  . aspirin EC  81 mg Oral Daily  . atorvastatin  40 mg Oral q1800  . calcium acetate  667 mg Oral TID WC  . docusate sodium  100 mg Oral BID  . ferric gluconate  (FERRLECIT/NULECIT) IV  125 mg Intravenous Daily  . multivitamin  1 tablet Oral Daily  . pantoprazole  40 mg Oral Q1200  . predniSONE  5 mg Oral Daily  . sertraline  50 mg Oral Daily   Assessment: 78 yo female with probable LLE cellulitis (red and swollen compared to R), WBC 17.1 and trending up, afebrile, blood cultures x2 pending. New start to HD; BFR 250, 3h HD today, follow up toleration of subsequent HD sessions to determine further post-HD doses.  Received 3rd HD today and is already back in room.    Goal of Therapy:  Target pre-HD level 15-25 mcg/ml or post-HD level 5-15 mcg/ml  Plan:  1. Vancomycin 1250 mg IV x1 (loading dose) 2. Follow up labs, cultures, clinical progress, and trough level at steady state if indicated 3. Follow up HD schedule for subsequent vancomycin doses to be given during HD.  Concha Norway 03/14/2012,1:01 PM

## 2012-03-14 NOTE — Plan of Care (Signed)
Problem: Food- and Nutrition-Related Knowledge Deficit (NB-1.1) Goal: Nutrition education Formal process to instruct or train a patient/client in a skill or to impart knowledge to help patients/clients voluntarily manage or modify food choices and eating behavior to maintain or improve health.  Outcome: Completed/Met Date Met:  03/14/12 RD consulted for Renal diet education; patient new to dialysis. Reviewed diet guidelines and recommendations utilizing "Choose A Meal" booklet from [facility]. Patient and spouse live at an Assisted Living Community and eat dinner at the [employer]; they prepare breakfast and lunch independently. Expect fair compliance. Questions answered. RD available for follow-up should more questions/concerns arise. Thank You.  Alger Memos, RD, LDN Pager #: [phone] After-Hours Pager #: [phone]

## 2012-03-14 NOTE — Progress Notes (Signed)
INITIAL ADULT NUTRITION ASSESSMENT Date: 03/14/2012   Time: 1:54 PM  INTERVENTION:  Scheduled Nepro supplement 3 times daily (425 kcals, 19.1 gm protein per 8 fl oz can)  Continue RENA-VIT daily  RD to follow for nutrition care plan  Reason for Assessment: Consult; poor PO intake  ASSESSMENT: Female 78 y.o.  Dx: Volume excess, primary renal sodium retention  Hx:  Past Medical History  Diagnosis Date  . Chronic kidney disease   . Hypertension   . Heart murmur     Dr Miranda [name] follows. Not followed by a Cardiologist  . Cancer     Breast- Lumpectomy.  RadiationTx  . Anemia   . Gout     Related Meds:     . allopurinol  100 mg Oral BID  . aspirin EC  81 mg Oral Daily  . atorvastatin  40 mg Oral q1800  . calcium acetate  667 mg Oral TID WC  . docusate sodium  100 mg Oral BID  . ferric gluconate (FERRLECIT/NULECIT) IV  125 mg Intravenous Daily  . multivitamin  1 tablet Oral QHS  . pantoprazole  40 mg Oral Q1200  . predniSONE  5 mg Oral Daily  . sertraline  50 mg Oral Daily  . vancomycin  1,250 mg Intravenous Once  . DISCONTD: multivitamin  1 tablet Oral Daily    Ht: 5\' 1"  (154.9 cm)  Wt: 132 lb 11.5 oz (60.2 kg)  Ideal Wt: 47.7 kg % Ideal Wt: 126%  Usual Wt: 62.7 kg -- pre-dialysis weight % Usual Wt: 96%  Body mass index is 25.08 kg/(m^2).  Food/Nutrition Related Hx: no triggers per admission nutrition screen  Labs:  CMP     Component Value Date/Time   NA 140 03/14/2012 0708   K 3.1* 03/14/2012 0708   CL 100 03/14/2012 0708   CO2 25 03/14/2012 0708   GLUCOSE 87 03/14/2012 0708   BUN 34* 03/14/2012 0708   CREATININE 3.21* 03/14/2012 0708   CALCIUM 8.6 03/14/2012 0708   PROT 5.4* 03/13/2012 0500   ALBUMIN 2.3* 03/14/2012 0708   AST 22 03/13/2012 0500   ALT 14 03/13/2012 0500   ALKPHOS 76 03/13/2012 0500   BILITOT 0.3 03/13/2012 0500   GFRNONAA 12* 03/14/2012 0708   GFRAA 14* 03/14/2012 0708     Intake/Output Summary (Last 24 hours) at 03/14/12 1356 Last data  filed at 03/14/12 1100  Gross per 24 hour  Intake    790 ml  Output   2121 ml  Net  -1331 ml    Phosphorus  Date Value Range Status  03/14/2012 4.0  2.3 - 4.6 mg/dL Final     Diet Order: Renal 60/70-09-29-1198 ml  Supplements/Tube Feeding: Nepro 3 times daily PRN, RENA-VIT daily  IVF:    DISCONTD: sodium chloride Last Rate: 20 mL/hr at 03/13/12 2202    Estimated Nutritional Needs:   Kcal: 1700-1900 Protein: 80-90 gm Fluid: 1200 ml  Patient admitted with progressivel increasing shortness of breath with lower extremity edema and uremic symptoms; determined that she had reached ESRD and the need to initiate hemodialysis was made; has had 3 treatment so far during hospitalization; states her weight has been stable; reports however, a poor appetite; PO intake at 0-75% per flowsheet records; RD brought Nepro supplement with lunch today; patient had not received since admission and was willing to try  NUTRITION DIAGNOSIS: -Inadequate oral intake (NI-2.1).  Status: Ongoing  RELATED TO: poor appetite  AS EVIDENCE BY: patient report  MONITORING/EVALUATION(Goals): Goal: Oral intake  with meals & supplements to meet >/= 90% of estimated nutrition needs Monitor: PO intake, supplement acceptance, weight, labs, I/O's  EDUCATION NEEDS: -Education needs addressed  Dietitian #: [phone]  DOCUMENTATION CODES Per approved criteria  -Not Applicable    Miranda [name] 03/14/2012, 1:54 PM

## 2012-03-15 ENCOUNTER — Encounter (HOSPITAL_COMMUNITY): Payer: Self-pay | Admitting: Vascular Surgery

## 2012-03-15 ENCOUNTER — Ambulatory Visit: Payer: Medicare Other | Admitting: Vascular Surgery

## 2012-03-15 LAB — CBC
HCT: 30.8 % — ABNORMAL LOW (ref 36.0–46.0)
MCHC: 30.2 g/dL (ref 30.0–36.0)
MCV: 96 fL (ref 78.0–100.0)
RDW: 19.2 % — ABNORMAL HIGH (ref 11.5–15.5)

## 2012-03-15 MED ORDER — VANCOMYCIN HCL 1000 MG IV SOLR
750.0000 mg | Freq: Once | INTRAVENOUS | Status: AC
  Administered 2012-03-15: 750 mg via INTRAVENOUS
  Filled 2012-03-15 (×2): qty 750

## 2012-03-15 MED ORDER — VANCOMYCIN HCL 1000 MG IV SOLR
750.0000 mg | INTRAVENOUS | Status: DC
  Filled 2012-03-15: qty 750

## 2012-03-15 MED ORDER — DARBEPOETIN ALFA-POLYSORBATE 60 MCG/0.3ML IJ SOLN
60.0000 ug | INTRAMUSCULAR | Status: DC
Start: 2012-03-19 — End: 2012-03-19
  Administered 2012-03-19: 60 ug via INTRAVENOUS
  Filled 2012-03-15: qty 0.3

## 2012-03-15 MED ORDER — HEPARIN SODIUM (PORCINE) 1000 UNIT/ML DIALYSIS
20.0000 [IU]/kg | INTRAMUSCULAR | Status: DC | PRN
  Filled 2012-03-15: qty 2

## 2012-03-15 NOTE — Progress Notes (Addendum)
ANTIBIOTIC CONSULT NOTE - FOLLOW UP  Pharmacy Consult for Vancomycin Indication: Probable Cellulitis  Allergies  Allergen Reactions  . Sulfa Antibiotics Other (See Comments)    Unknown.  "Long time ago"    Patient Measurements: Height: 5\' 1"  (154.9 cm) Weight: 135 lb 6.4 oz (61.417 kg) IBW/kg (Calculated) : 47.8    Vital Signs: Temp: 98.3 F (36.8 C) (07/19 0551) Temp src: Oral (07/19 0551) BP: 153/71 mmHg (07/19 0551) Pulse Rate: 78  (07/19 0551) Intake/Output from previous day: 07/18 0701 - 07/19 0700 In: 350 [P.O.:240; IV Piggyback:110] Out: 2121  Intake/Output from this shift:    Labs:  Basename 03/14/12 0708 03/13/12 0500 03/12/12 1611  WBC 17.1* 16.2* --  HGB 8.6* 8.9* --  PLT 310 326 --  LABCREA -- -- --  CREATININE 3.21* 3.66* 4.65*   Estimated Creatinine Clearance: 10.4 ml/min (by C-G formula based on Cr of 3.21). No results found for this basename: VANCOTROUGH:2,VANCOPEAK:2,VANCORANDOM:2,GENTTROUGH:2,GENTPEAK:2,GENTRANDOM:2,TOBRATROUGH:2,TOBRAPEAK:2,TOBRARND:2,AMIKACINPEAK:2,AMIKACINTROU:2,AMIKACIN:2, in the last 72 hours   Microbiology: Recent Results (from the past 720 hour(s))  SURGICAL PCR SCREEN     Status: Abnormal   Collection Time   02/28/12  6:30 AM      Component Value Range Status Comment   MRSA, PCR NEGATIVE  NEGATIVE Final    Staphylococcus aureus POSITIVE (*) NEGATIVE Final   CULTURE, BLOOD (ROUTINE X 2)     Status: Normal (Preliminary result)   Collection Time   03/14/12 11:45 AM      Component Value Range Status Comment   Specimen Description BLOOD LEFT HAND   Final    Special Requests BOTTLES DRAWN AEROBIC ONLY 4CC   Final    Culture  Setup Time 03/14/2012 18:20   Final    Culture     Final    Value:        BLOOD CULTURE RECEIVED NO GROWTH TO DATE CULTURE WILL BE HELD FOR 5 DAYS BEFORE ISSUING A FINAL NEGATIVE REPORT   Report Status PENDING   Incomplete   CULTURE, BLOOD (ROUTINE X 2)     Status: Normal (Preliminary result)   Collection Time   03/14/12 12:00 PM      Component Value Range Status Comment   Specimen Description BLOOD RIGHT HAND   Final    Special Requests BOTTLES DRAWN AEROBIC ONLY 5CC   Final    Culture  Setup Time 03/14/2012 18:20   Final    Culture     Final    Value:        BLOOD CULTURE RECEIVED NO GROWTH TO DATE CULTURE WILL BE HELD FOR 5 DAYS BEFORE ISSUING A FINAL NEGATIVE REPORT   Report Status PENDING   Incomplete     Anti-infectives     Start     Dose/Rate Route Frequency Ordered Stop   03/14/12 1330   vancomycin (VANCOCIN) 1,250 mg in sodium chloride 0.9 % 250 mL IVPB        1,250 mg 166.7 mL/hr over 90 Minutes Intravenous  Once 03/14/12 1315 03/14/12 1850   03/12/12 0833   cefUROXime (ZINACEF) 1.5 g in dextrose 5 % 50 mL IVPB        1.5 g 100 mL/hr over 30 Minutes Intravenous 30 min pre-op 03/12/12 0833 03/12/12 1400          Assessment: 78 yo female with probable LLE cellulitis (red and swollen compared to R), WBC 17.1 and trending up, afebrile, blood cultures x2 pending. New start to HD; BFR 250,scheduled for 4th HD today  Will  Follow up on HD schedule Goal of Therapy:  Target pre-HD level 15-25 mcg/ml or post-HD level 5-15 mcg/ml   Plan:  Vancomycin 750 mg after HD today and then if HD continues  after each HD. Will foll up on HD schedule Levels as needed.  Miranda [name] 03/15/2012,10:26 AM

## 2012-03-15 NOTE — Progress Notes (Signed)
Order received, chart reviewed and noted that pt is a resident at [facility] in their ALF, however plan now is for pt to go to SNF part of [facility]. Will defer OT eval to that facility. Acute OT will sign off.  Ignacia Palma, [place] [phone] 03/15/2012

## 2012-03-15 NOTE — Progress Notes (Signed)
Thank you for consult on Mrs. Miranda [name]. Spoke to patient and her husband regarding CIR for rehab here.  Patient feels CIR will be too much for her and travelling back and forth is a burden for her husband.  She would like to go to rehab at [facility] past discharge.  Will defer consult for now.

## 2012-03-15 NOTE — Plan of Care (Signed)
Problem: Phase I Progression Outcomes Goal: Voiding-avoid urinary catheter unless indicated Outcome: Completed/Met Date Met:  03/15/12 Incont.

## 2012-03-15 NOTE — Consult Note (Signed)
Admit date: 03/12/2012 Referring Physician  Dr. Arlean Hopping Primary Physician Darnelle Bos, MD Primary Cardiologist  None Reason for Consultation  : Evaluate Aortic Stenosis  HPI: 78 year old female who recently began hemodialysis with hypertension, aortic stenosis, shortness of breath much better since hemodialysis was started being evaluated for aortic stenosis. A recent echocardiogram showed normal LV function and severe aortic stenosis with a valve area of 0.8 cm. This was discussed with Dr. Earl Gala.   I personally have viewed the echocardiogram and despite the calculated valve area, her peak velocity is 3.3 m/s with a mean gradient of 29 mmHg. Both of these values are in the moderate range.   Because of this, I would not recommend surgery at this point. We would continue to monitor.     PMH:   Past Medical History  Diagnosis Date  . Chronic kidney disease   . Hypertension   . Heart murmur     Dr Particia Lather follows. Not followed by a Cardiologist  . Cancer     Breast- Lumpectomy.  RadiationTx  . Anemia   . Gout     PSH:   Past Surgical History  Procedure Date  . Parathyroidectomy   . Knee surgery     Right arthroscopy  . Breast lumpectomy     left  . Eye surgery     Cataract eyes  . Appendectomy   . Av fistula placement 02/28/2012    Procedure: ARTERIOVENOUS (AV) FISTULA CREATION;  Surgeon: Fransisco Hertz, MD;  Location: [facility] OR;  Service: Vascular;  Laterality: Right;  Ultrasound guided.  . Insertion of dialysis catheter 03/12/2012    Procedure: INSERTION OF DIALYSIS CATHETER;  Surgeon: Chuck Hint, MD;  Location: [facility] OR;  Service: Vascular;  Laterality: N/A;  Right Internal Jugular Placement   Allergies:  Sulfa antibiotics Prior to Admit Meds:   Prescriptions prior to admission  Medication Sig Dispense Refill  . acetaminophen (TYLENOL) 500 MG tablet Take 500 mg by mouth every 6 (six) hours as needed. For pain      . allopurinol (ZYLOPRIM) 100 MG tablet Take  100 mg by mouth 2 (two) times daily.      Marland Kitchen amLODipine (NORVASC) 10 MG tablet Take 10 mg by mouth daily.       Marland Kitchen aspirin EC 81 MG tablet Take 81 mg by mouth daily.      Marland Kitchen atorvastatin (LIPITOR) 40 MG tablet Take 40 mg by mouth daily.       . calcium carbonate (OS-CAL) 600 MG TABS Take 600 mg by mouth 2 (two) times daily with a meal.       . furosemide (LASIX) 40 MG tablet Take 40 mg by mouth daily.      . furosemide (LASIX) 40 MG tablet Take 80 mg by mouth See admin instructions. Taken bid 03/11/12 day before procedure      . glucosamine-chondroitin 500-400 MG tablet Take 1 tablet by mouth 3 (three) times daily.      Marland Kitchen HYDROcodone-acetaminophen (VICODIN) 5-500 MG per tablet Take 0.5 tablets by mouth every 6 (six) hours as needed. For pain      . labetalol (NORMODYNE) 100 MG tablet Take 200 mg by mouth daily.       . multivitamin-iron-minerals-folic acid (CENTRUM) chewable tablet Chew 1 tablet by mouth daily.        . pantoprazole (PROTONIX) 40 MG tablet Take 40 mg by mouth daily.       . predniSONE (DELTASONE) 5 MG tablet Take 5 mg  by mouth daily.      . sertraline (ZOLOFT) 50 MG tablet Take 50 mg by mouth daily.      . SODIUM BICARBONATE, ANTACID, PO Take 1 tablet by mouth 2 (two) times daily. Solu tab dissolved in water      . sodium polystyrene (KAYEXALATE) 15 GM/60ML suspension Take 30 g by mouth once.      Marland Kitchen DISCONTD: calcitRIOL (ROCALTROL) 0.25 MCG capsule Take 0.25 mcg by mouth daily.         Fam HX:    Family History  Problem Relation Age of Onset  . Hypertension Mother    Social HX:    History   Social History  . Marital Status: Married    Spouse Name: N/A    Number of Children: N/A  . Years of Education: N/A   Occupational History  . Not on file.   Social History Main Topics  . Smoking status: Never Smoker   . Smokeless tobacco: Never Used  . Alcohol Use: No  . Drug Use: No  . Sexually Active: Not on file   Other Topics Concern  . Not on file   Social History  Narrative  . No narrative on file     ROS:  All 11 ROS were addressed and are negative except what is stated in the HPI  Physical Exam: Blood pressure 151/53, pulse 80, temperature 98 F (36.7 C), temperature source Oral, resp. rate 18, height 5\' 1"  (1.549 m), weight 61.417 kg (135 lb 6.4 oz), SpO2 95.00%.    General: Elderly, in bed, appears comfortable Head: Eyes PERRLA, No xanthomas.   Normal cephalic and atramatic  Lungs:  Mild crackles at bases, no wheezes, normal respiratory effort. Heart:   Regular rhythm with 3/6 systolic murmur heard at both right upper sternal border as well as apex. Radiation of this murmur to carotids noted. No JVD.   Abdomen: Bowel sounds are positive, abdomen soft and non-tender without masses. No hepatosplenomegaly. Msk:  Back normal. Normal strength and tone for age. Extremities:   Mild, 1+ edema.  DP +1 Neuro: Alert and oriented X 3, non-focal, MAE x 4 GU: Deferred Rectal: Deferred Psych:  Good affect, responds appropriately    Labs:   Lab Results  Component Value Date   WBC 18.1* 03/15/2012   HGB 9.3* 03/15/2012   HCT 30.8* 03/15/2012   MCV 96.0 03/15/2012   PLT 243 03/15/2012    Lab 03/14/12 0708 03/13/12 0500  NA 140 --  K 3.1* --  CL 100 --  CO2 25 --  BUN 34* --  CREATININE 3.21* --  CALCIUM 8.6 --  PROT -- 5.4*  BILITOT -- 0.3  ALKPHOS -- 76  ALT -- 14  AST -- 22  GLUCOSE 87 --   No results found for this basename: PTT     ASSESSMENT/PLAN:   78 year old with new start hemodialysis, moderate aortic stenosis, dyspnea improving, hypertension  1. Aortic stenosis  -as above, I have reviewed echocardiogram personally and both peak velocity and mean gradient are in the moderate range. I would not recommend surgery at this time. She has responded well to decrease in her intravascular volume.  We will continue to monitor her aortic stenosis however in the outpatient setting.  I do believe that treating her hypertension with either  Norvasc or labetalol would be reasonable. Of course she will be more sensitive given her moderate aortic stenosis. When asked if she would undergo replacement of aortic valve surgically in the  future, of course she was hesitant saying that she has been through quite a bit recently.  Please let us know if we can be of further assistance. Donato Schultz, MD  03/15/2012  3:26 PM

## 2012-03-15 NOTE — Progress Notes (Signed)
[facility] Progress Note  Subjective:  Not much appetite. Feels tired. Breathing ok. Disappointed she is not going home today.  Objective Filed Vitals:   03/14/12 1758 03/14/12 2101 03/15/12 0003 03/15/12 0551  BP: 139/56 147/64  153/71  Pulse: 84 83  78  Temp: 98.8 F (37.1 C) 99 F (37.2 C) 98.3 F (36.8 C) 98.3 F (36.8 C)  TempSrc: Oral Oral Oral Oral  Resp: 18 17  17   Height:      Weight:  61.417 kg (135 lb 6.4 oz)    SpO2: 100% 94%  90%   Physical Exam General: NAD sitting in recliner, alert and in good humor Heart: RRR 3/6 murmur Lungs: no wheezes or rales Abdomen: soft NT Extremities: 1+ LE edema; much improved per pt. L lower leg erythema much better.  Dialysis Access: right I-J and maturing right upper AVF  Assessment/Plan: 1. Volume overload/Hypoxia/pulm edema/pleural effusion- leg edema improved, repeat CXR ordered for today.  ECHO nl LV function 65-70% EF, severe AS w valve area 0.8 cm2. I discussed w pts PCP (Dr. Earl Gala), no recent ECHO or cardiology appts, no known hx of AS. Will consult [facility] for recommendations. 2. LLE Cellulitis/leukocytosis- much better today, cont IV Vanc 7-10d. Added urine culture to specimen from yest (UA 11-20wbc, 3-6rbc, few bact).  3. Hypokalemia- K level 3.1; changed 4K bath Thursday - she tells me she has had to take kayexalate in the past for ^ K; finds the diet very difficult 4. ESRD - clipped to TTS [facility]; s/p 3 HD treatments; rest today; uremia resolving; UF 2.1  Thursday; f/u CXR Wednesday with improvement interstitial edema and small effusions; breathing better; next Tx Sat (outpt schedule). Severe AS on ECHO, may contribute to BP problems with dialysis.   5. Bleeding at IJ cath site- resolved, DVT proph SQ heparin stopped due to the bleeding. 6. Vascular Access- maturing RUA AVF placed 02/28/12 Dr. Imogene Burn but not ready for use; RIJ PC placed Tu 7/16 Dr. Edilia Bo; no further bleeding around cath site; Will  start tight heparin next HD tomorrow. 7. Hypertension/volume- holding BP meds (Norvasc, Labetalol). UF 2.1 Thursday; Goadl for Saturday HD 2-3 liters; BP now ^; Echo pending; follow; ? Resume one of BP meds; defer to Dr. Arlean Hopping. Hold off on BP meds for now, severe AS will complicate this issue. 8. Anemia - Hgb 8.6 this am; Aranesp x 1 7/16; redose next week if still here and iron bolus in progress; follow  9. Metabolic bone disease - phos down 4.0, ca+ 8.6, no binders; added Phoslo 667mg , 1 with meals, continue Zemplar ; follow ca/phos 10. DJD/Gout/spinal Stenosis- on prednisone 5mg /d  11. Disposition- deconditioned, will need rehab. Preferred SNF not available- discussed with PT and SW, consult CIR. .  12. Nutrition- dietician consult re: renal diet ordered.-done Additional Objective Labs: Basic Metabolic Panel:  Lab 03/14/12 1610 03/13/12 0500 03/12/12 1611  NA 140 140 137  K 3.1* 3.6 4.1  CL 100 101 99  CO2 25 21 20   GLUCOSE 87 75 85  BUN 34* 47* 75*  CREATININE 3.21* 3.66* 4.65*  CALCIUM 8.6 8.5 8.7  ALB -- -- --  PHOS 4.0 -- 5.0*   Liver Function Tests:  Lab 03/14/12 0708 03/13/12 0500  AST -- 22  ALT -- 14  ALKPHOS -- 76  BILITOT -- 0.3  PROT -- 5.4*  ALBUMIN 2.3* 2.2*  CBC:  Lab 03/14/12 0708 03/13/12 0500 03/12/12 0857  WBC 17.1* 16.2* --  NEUTROABS -- -- --  HGB 8.6* 8.9* 10.2*  HCT 27.9* 28.7* 30.0*  MCV 92.4 92.9 --  PLT 310 326 --   Blood Culture    Component Value Date/Time   SDES BLOOD RIGHT HAND 03/14/2012 1200   SPECREQUEST BOTTLES DRAWN AEROBIC ONLY 5CC 03/14/2012 1200   CULT        BLOOD CULTURE RECEIVED NO GROWTH TO DATE CULTURE WILL BE HELD FOR 5 DAYS BEFORE ISSUING A FINAL NEGATIVE REPORT 03/14/2012 1200   REPTSTATUS PENDING 03/14/2012 1200   Studies/Results: Dg Chest 2 View  03/13/2012  *RADIOLOGY REPORT*  Clinical Data: Hypoxemia, dialysis patient, shortness of breath  CHEST - 2 VIEW  Comparison: Portable chest x-ray of 03/12/2012   Findings: There has been some improvement in the interstitial edema pattern.  There do appear to be a small pleural effusions present left slightly larger than right.  Cardiomegaly is stable.  Central venous line is unchanged.  IMPRESSION: Improvement in interstitial edema pattern.  Small effusions.  Original Report Authenticated By: Juline Patch, M.D.   Medications:      . allopurinol  100 mg Oral BID  . aspirin EC  81 mg Oral Daily  . atorvastatin  40 mg Oral q1800  . calcium acetate  667 mg Oral TID WC  . docusate sodium  100 mg Oral BID  . feeding supplement (NEPRO CARB STEADY)  237 mL Oral TID BM  . ferric gluconate (FERRLECIT/NULECIT) IV  125 mg Intravenous Daily  . multivitamin  1 tablet Oral QHS  . pantoprazole  40 mg Oral Q1200  . predniSONE  5 mg Oral Daily  . sertraline  50 mg Oral Daily  . vancomycin  1,250 mg Intravenous Once  . DISCONTD: multivitamin  1 tablet Oral Daily    I  have reviewed scheduled and prn medications.  Sheffield Slider, PA-C [facility] Beeper [phone]  03/15/2012,9:44 AM  LOS: 3 days   Patient seen and examined and agree with assessment and plan as above with additions as indicated.  Vinson Moselle  MD [facility] [phone] pgr    [phone] cell 03/15/2012, 12:20 PM

## 2012-03-15 NOTE — Clinical Social Work Placement (Addendum)
    Clinical Social Work Department CLINICAL SOCIAL WORK PLACEMENT NOTE 03/15/2012  Patient:  CHARRIE, MCCONNON  Account Number:  [id number] Admit date:  03/12/2012  Clinical Social Worker:  Genelle Bal, LCSW  Date/time:  03/15/2012 05:14 AM  Clinical Social Work is seeking post-discharge placement for this patient at the following level of care:   SKILLED NURSING   (*CSW will update this form in Epic as items are completed)     Patient/family provided with [facility] Department of Clinical Social Work's list of facilities offering this level of care within the geographic area requested by the patient (or if unable, by the patient's family).  03/15/2012  Patient/family informed of their freedom to choose among providers that offer the needed level of care, that participate in Medicare, Medicaid or managed care program needed by the patient, have an available bed and are willing to accept the patient.    Patient/family informed of [facility]' ownership interest in [facility], as well as of the fact that they are under no obligation to receive care at this facility.  PASARR submitted to EDS on 03/15/2012 PASARR number received from EDS on 03/15/2012  FL2 transmitted to all facilities in geographic area requested by pt/family on  03/15/2012 FL2 transmitted to all facilities within larger geographic area on   Patient informed that his/her managed care company has contracts with or will negotiate with  certain facilities, including the following:     Patient/family informed of bed offers received: 03/15/12  Patient chooses bed at [facility] Physician recommends and patient chooses bed at    Patient to be transferred to [facility] on 03/19/12   Patient to be transferred to facility by ambulance  The following physician request were entered in Epic:   Additional Comments: Patient from [facility] and will be going to  [facility] for short-term rehab on Monday, 7/22. 03/19/12: Patient able to discharge today to [facility] as they have an available bed.

## 2012-03-15 NOTE — Progress Notes (Signed)
Physical Therapy Treatment Patient Details Name: Miranda [name] MRN: [id number] DOB: May 16, 1922 Today's Date: 03/15/2012 Time: 0850-0910 PT Time Calculation (min): 20 min  PT Assessment / Plan / Recommendation Comments on Treatment Session  Pt. felt weak today during  session.  Urinary incontinence is a great hindrance to her safety at this point.  Would benefit from OT consult to assist in ADLs efforts.  Pt. would be a good candidate for inpatient rehab to maximize her independence and allow for return to her independent living home with her husband. Do believe she could tolerate rehab if given some built in rest breaks. Will change recommendation.    Follow Up Recommendations  Inpatient Rehab    Barriers to Discharge        Equipment Recommendations  Defer to next venue    Recommendations for Other Services OT consult;Rehab consult  Frequency Min 3X/week   Plan Discharge plan needs to be updated    Precautions / Restrictions Precautions Precautions: Fall Precaution Comments: pt. incontinent of urine several times when up Restrictions Weight Bearing Restrictions: No   Pertinent Vitals/Pain No pain    Mobility  Bed Mobility Bed Mobility: Not assessed (pt. up in recliner) Transfers Transfers: Sit to Stand;Stand to Sit Sit to Stand: 3: Mod assist;From chair/3-in-1;With upper extremity assist Stand to Sit: 4: Min assist;To chair/3-in-1;With upper extremity assist Details for Transfer Assistance: vc's for safety and technique Ambulation/Gait Ambulation/Gait Assistance: 4: Min assist Ambulation Distance (Feet): 30 Feet Assistive device: Rolling walker Ambulation/Gait Assistance Details: pt. needed min assist for safety and stability Gait Pattern: Step-through pattern General Gait Details: pt. incontinent of urine during walk    Exercises     PT Diagnosis:    PT Problem List:   PT Treatment Interventions:     PT Goals Acute Rehab PT Goals PT Goal: Sit to Stand - Progress:  Progressing toward goal PT Goal: Ambulate - Progress: Goal set today  Visit Information  Last PT Received On: 03/15/12 Assistance Needed: +2 (second person to help in managing urinary incontinence)    Subjective Data  Subjective: am I supposed to feel weak today?   Cognition  Overall Cognitive Status: Appears within functional limits for tasks assessed/performed Arousal/Alertness: Awake/alert Orientation Level: Oriented X4 / Intact Behavior During Session: WFL for tasks performed    Balance     End of Session PT - End of Session Equipment Utilized During Treatment: Gait belt Activity Tolerance: Patient tolerated treatment well;Patient limited by fatigue Patient left: in chair;with call bell/phone within reach Nurse Communication: Mobility status   GP     Ferman Hamming 03/15/2012, 12:12 PM Acute Rehabilitation Services [phone] (pager)

## 2012-03-15 NOTE — Clinical Social Work Psychosocial (Signed)
     Clinical Social Work Department BRIEF PSYCHOSOCIAL ASSESSMENT 03/15/2012  Patient:  Miranda [name], Miranda [name]     Account Number:  [id number]     Admit date:  03/12/2012  Clinical Social Worker:  Delmer Islam  Date/Time:  03/15/2012 03:50 AM  Referred by:  Physician  Date Referred:  03/14/2012 Referred for  SNF Placement   Other Referral:   Interview type:  Patient Other interview type:   CSW also talked with patient's husband Onalee Hua, who was at the bedside and son Merlyn Albert [phone]).    PSYCHOSOCIAL DATA Living Status:  HUSBAND Admitted from facility:   Level of care:   Primary support name:  Anneka Studer Primary support relationship to patient:  SPOUSE Degree of support available:   Strong support. Patient also has 4 very supportive sons. Their son Onalee Hua lives in [place] and played a key role in her discharge planning.    CURRENT CONCERNS Current Concerns  Post-Acute Placement   Other Concerns:    SOCIAL WORK ASSESSMENT / PLAN Patient and husband live at [facility] independently. She is in need of ST rehab prior to returning home and is in agreement with rehab, as is her husband and son Merlyn Albert.    MD requested Saturday discharge however [facility] does not accept weekend admissions of new patients, and this information was relayed to MD. CSW worked with Lennie Hummer at [facility] and she worked out that patient will d/c Monday to [facility] until they have a bed. They have also arranged her transportation to dialysis on TTS at [facility].    CSW advised MD, son Merlyn Albert, patient and her husband of discharge plan.   Assessment/plan status:  Psychosocial Support/Ongoing Assessment of Needs Other assessment/ plan:   Information/referral to community resources:    PATIENTS/FAMILYS RESPONSE TO PLAN OF CARE: Mrs. Nakama is a bit disappointed that [facility] does not have a SNF bed available on Monday, but CSW assured her  that per Lennie Hummer, they will move her as soon as a bed becomes available.  CSW spoke by phone with son and he is very pleased with the plan, as is patient's husband.

## 2012-03-16 ENCOUNTER — Inpatient Hospital Stay (HOSPITAL_COMMUNITY): Payer: Medicare Other

## 2012-03-16 LAB — CBC
HCT: 30.2 % — ABNORMAL LOW (ref 36.0–46.0)
Hemoglobin: 8.9 g/dL — ABNORMAL LOW (ref 12.0–15.0)
RDW: 19.9 % — ABNORMAL HIGH (ref 11.5–15.5)
WBC: 16.3 10*3/uL — ABNORMAL HIGH (ref 4.0–10.5)

## 2012-03-16 LAB — RENAL FUNCTION PANEL
Albumin: 2.3 g/dL — ABNORMAL LOW (ref 3.5–5.2)
BUN: 32 mg/dL — ABNORMAL HIGH (ref 6–23)
Chloride: 102 mEq/L (ref 96–112)
GFR calc Af Amer: 15 mL/min — ABNORMAL LOW (ref >90)
Potassium: 3.9 mEq/L (ref 3.5–5.1)
Sodium: 143 mEq/L (ref 135–145)

## 2012-03-16 LAB — URINE CULTURE

## 2012-03-16 MED ORDER — ACETAMINOPHEN 325 MG PO TABS
ORAL_TABLET | ORAL | Status: AC
  Filled 2012-03-16: qty 2

## 2012-03-16 MED ORDER — POLYETHYLENE GLYCOL 3350 17 G PO PACK
17.0000 g | PACK | Freq: Every day | ORAL | Status: DC
  Administered 2012-03-16 – 2012-03-17 (×2): 17 g via ORAL
  Filled 2012-03-16 (×4): qty 1

## 2012-03-16 NOTE — Procedures (Signed)
I was present at this dialysis session. I have reviewed the session itself and made appropriate changes.   Vinson Moselle, MD [employer] 03/16/2012, 10:39 AM

## 2012-03-16 NOTE — Progress Notes (Signed)
[facility] Progress Note  Subjective: Talked to a lot of people yesterday. No BM since admission, but hasn't eaten much.  Objective Filed Vitals:   03/16/12 0815 03/16/12 0820 03/16/12 0825 03/16/12 0830  BP: 106/46 143/44 118/44 118/44  Pulse:    78  Temp:      TempSrc:      Resp:    20  Height:      Weight:      SpO2:       Physical Exam General: NAD on HD Heart: RRR 3/6 murmur  Lungs: no wheezes or rales  Abdomen: soft NT  Extremities: tr LE edema; L lower leg erythema improved Dialysis Access: right I-J and maturing right upper AVF  Assessment/Plan:  1. Volume overload/Hypoxia/pulm edema/pleural effusion- leg edema improved, repeat CXR ordered for today. Wean oxygen. 2. Aortic stenosis- ECHO nl LV function 65-70% EF. Pt evaluated by Dr. Anne Fu who discussed with Dr. Earl Gala.  He felt despite the low value area, her peak velocity and mean gradient were within moderate range.  No recommended surgery 3. ID/LLE Cellulitis/leukocytosis- improved, cont IV Vanc 7-10 d (first dose 7/18) (UA 11-20wbc, 3-6rbc, few bact) - blood and urine cultures pending 4. Hypokalemia- K level 3.9; on 4K bath today- she tells me she has had to take kayexalate in the past for ^ K; finds the diet very difficult 5. ESRD - clipped to TTS [facility]; Severe AS on ECHO, may contribute to BP problems with dialysis. She is tentatively scheduled to start HD this coming Tuesday at 10:30 at [facility]. 6. Bleeding at IJ cath site- resolved, DVT proph SQ heparin stopped due to the bleeding. 7. Vascular Access- maturing RUA AVF placed 02/28/12 Dr. Imogene Burn but not ready for use; RIJ PC placed Tu 7/16 Dr. Edilia Bo; no further bleeding around cath site;  8. Hypertension/volume- holding BP meds (Norvasc, Labetalol). UF 2.1 Thursday; Goadl for Saturday HD 2-3 liters; BP now responding to decrease in volume. If BP meds, needed Dr. Anne Fu felt either norvasc or labetolol would be reasonable. - BP drop on HD, goal decreased.  No BP meds yet. 9. Anemia - Hgb 8.9 - stable; Aranesp x 1 on  7/16; redose next week if still here and iron bolus in progress; 10. Metabolic bone disease - Ca x P ok on phoslo 667mg , 1 with meals, continue Zemplar ;  11. DJD/Gout/spinal Stenosis- on prednisone 5mg /d  12. Disposition- deconditioned, has a spot at SNF level [facility] on Monday. Prob d/c Mon if medically stable. 13. Debility- PT working w patient 14. Nutrition- dietician consult re: renal diet ordered.-done 15. Constipation add daily miralax.   Sheffield Slider, PA-C [facility] Beeper [phone]  03/16/2012,9:12 AM  LOS: 4 days   Patient seen and examined and agree with assessment and plan as above with additions as indicated.  Vinson Moselle  MD [facility] [phone] pgr    [phone] cell 03/16/2012, 10:38 AM       Additional Objective Labs: Basic Metabolic Panel:  Lab 03/16/12 9629 03/14/12 0708 03/13/12 0500 03/12/12 1611  NA 143 140 140 --  K 3.9 3.1* 3.6 --  CL 102 100 101 --  CO2 27 25 21  --  GLUCOSE 73 87 75 --  BUN 32* 34* 47* --  CREATININE 3.08* 3.21* 3.66* --  CALCIUM 9.4 8.6 8.5 --  ALB -- -- -- --  PHOS 3.0 4.0 -- 5.0*   Liver Function Tests:  Lab 03/16/12 0700 03/14/12 5284  03/13/12 0500  AST -- -- 22  ALT -- -- 14  ALKPHOS -- -- 76  BILITOT -- -- 0.3  PROT -- -- 5.4*  ALBUMIN 2.3* 2.3* 2.2*  CBC:  Lab 03/16/12 0700 03/15/12 1409 03/14/12 0708 03/13/12 0500  WBC 16.3* 18.1* 17.1* --  NEUTROABS -- -- -- --  HGB 8.9* 9.3* 8.6* --  HCT 30.2* 30.8* 27.9* --  MCV 95.9 96.0 92.4 92.9  PLT 250 243 310 --   Blood Culture    Component Value Date/Time   SDES BLOOD RIGHT HAND 03/14/2012 1200   SPECREQUEST BOTTLES DRAWN AEROBIC ONLY 5CC 03/14/2012 1200   CULT        BLOOD CULTURE RECEIVED NO GROWTH TO DATE CULTURE WILL BE HELD FOR 5 DAYS BEFORE ISSUING A FINAL NEGATIVE REPORT 03/14/2012 1200   REPTSTATUS PENDING 03/14/2012 1200   Medications:        . acetaminophen      . allopurinol  100 mg Oral BID  . aspirin EC  81 mg Oral Daily  . atorvastatin  40 mg Oral q1800  . calcium acetate  667 mg Oral TID WC  . darbepoetin (ARANESP) injection - DIALYSIS  60 mcg Intravenous Q Tue-HD  . docusate sodium  100 mg Oral BID  . feeding supplement (NEPRO CARB STEADY)  237 mL Oral TID BM  . ferric gluconate (FERRLECIT/NULECIT) IV  125 mg Intravenous Daily  . multivitamin  1 tablet Oral QHS  . pantoprazole  40 mg Oral Q1200  . predniSONE  5 mg Oral Daily  . sertraline  50 mg Oral Daily  . vancomycin  750 mg Intravenous Once  . DISCONTD: vancomycin  750 mg Intravenous Q M,W,F-HD    I  have reviewed scheduled and prn medications.

## 2012-03-17 NOTE — Progress Notes (Signed)
[facility] Progress Note  Subjective:  Ready to go home.  Wants to get back to some semblance of normalcy.  Dialysis is much harder than she expected.  Exhausted after treatments.  Objective Filed Vitals:   03/16/12 1800 03/16/12 2122 03/17/12 0526 03/17/12 0746  BP: 138/65 146/68 159/53 155/57  Pulse: 76 73 78 77  Temp: 98.8 F (37.1 C) 99.3 F (37.4 C) 98 F (36.7 C) 98.5 F (36.9 C)  TempSrc: Oral Oral Oral Oral  Resp: 17 18 18 18   Height:      Weight:  58.9 kg (129 lb 13.6 oz)    SpO2: 99% 94% 91% 96%   Physical Exam  General: NAD in bed alert and conversant Heart: RRR 3/6 murmur  Lungs: crackles left base Abdomen: soft NT  Extremities: tr LE edema; L lower foot more petechial erythema  Dialysis Access: right I-J and maturing right upper AVF   Assessment/Plan:  1. New ESRD - clipped to TTS [facility]; Severe AS on ECHO, may contribute to BP problems with dialysis. She is tentatively scheduled to start HD this coming Tuesday at 10:30 at [facility]. 2. ID/LLE Cellulitis/leukocytosis- improved, cont IV Vanc 7-10 d (first dose 7/18) (UA 11-20wbc, 3-6rbc, few bact) - blood cultures pending; urine culture negative 3. Volume overload/Hypoxia/pulm edema/pleural effusion- leg edema improved,  Wean oxygen - discussed with RN. 5.5kg decrease since admission. D/C O2.  4. Aortic stenosis- ECHO nl LV function 65-70% EF. Pt evaluated by Dr. Anne Fu who discussed with Dr. Earl Gala. He felt despite the low value area, her peak velocity and mean gradient were within moderate range. No recommended surgery 5. Hypokalemia- have been using a 4K bath- she tells me she has had to take kayexalate in the past for ^ K; finds the diet very difficult 6. Bleeding at IJ cath site- resolved, DVT proph SQ heparin stopped due to the bleeding. 7. Vascular Access- maturing RUA AVF placed 02/28/12 Dr. Imogene Burn but not ready for use; RIJ PC placed Tu 7/16 Dr. Edilia Bo; no further bleeding around cath site;   8. Hypertension/volume- holding BP meds (Norvasc, Labetalol). UF 2.1 Thursday and 1.99  Saturday - unable to tolerate a higher goal ; BP a little elevated responding to decrease in volume. If BP meds, needed Dr. Anne Fu felt either norvasc or labetolol would be reasonable. - Given age and AS, cannot be aggressive with volume removal and may need to keep BP a little high 9. Anemia - Hgb 8.9 - stable; Aranesp x 1 on 7/16; redose if not discharged and iron bolus in progress; 10. Metabolic bone disease - Ca x P ok on phoslo 667mg , 1 with meals, continue Zemplar ;  11. DJD/Gout/spinal Stenosis- on prednisone 5mg /d  12. Disposition- deconditioned CIR consult also done - pending, has a spot at SNF [facility] on Monday. Plan is to d/c Monday to SNF at [facility]. [facility]. 13. Deconditioning - PT working w patient 14. Nutrition- dietician consult re: renal diet ordered.-done 15. Constipation - added daily miralax Sheffield Slider, PA-C [facility] Beeper [phone]  03/17/2012,8:43 AM  LOS: 5 days   Patient seen and examined and agree with assessment and plan as above with additions as indicated.  Vinson Moselle  MD [facility] [phone] pgr    [phone] cell 03/17/2012, 3:41 PM      Additional Objective Labs: Basic Metabolic Panel:  Lab 03/16/12 2130 03/14/12 0708 03/13/12 0500 03/12/12 1611  NA 143 140 140 --  K 3.9 3.1* 3.6 --  CL 102 100 101 --  CO2 27 25 21  --  GLUCOSE 73 87 75 --  BUN 32* 34* 47* --  CREATININE 3.08* 3.21* 3.66* --  CALCIUM 9.4 8.6 8.5 --  ALB -- -- -- --  PHOS 3.0 4.0 -- 5.0*   Liver Function Tests:  Lab 03/16/12 0700 03/14/12 0708 03/13/12 0500  AST -- -- 22  ALT -- -- 14  ALKPHOS -- -- 76  BILITOT -- -- 0.3  PROT -- -- 5.4*  ALBUMIN 2.3* 2.3* 2.2*  CBC:  Lab 03/16/12 0700 03/15/12 1409 03/14/12 0708 03/13/12 0500  WBC 16.3* 18.1* 17.1* --  NEUTROABS -- -- -- --  HGB 8.9* 9.3*  8.6* --  HCT 30.2* 30.8* 27.9* --  MCV 95.9 96.0 92.4 92.9  PLT 250 243 310 --  Medications:      . acetaminophen      . allopurinol  100 mg Oral BID  . aspirin EC  81 mg Oral Daily  . atorvastatin  40 mg Oral q1800  . calcium acetate  667 mg Oral TID WC  . darbepoetin (ARANESP) injection - DIALYSIS  60 mcg Intravenous Q Tue-HD  . docusate sodium  100 mg Oral BID  . feeding supplement (NEPRO CARB STEADY)  237 mL Oral TID BM  . ferric gluconate (FERRLECIT/NULECIT) IV  125 mg Intravenous Daily  . multivitamin  1 tablet Oral QHS  . pantoprazole  40 mg Oral Q1200  . polyethylene glycol  17 g Oral Daily  . predniSONE  5 mg Oral Daily  . sertraline  50 mg Oral Daily

## 2012-03-18 ENCOUNTER — Inpatient Hospital Stay (HOSPITAL_COMMUNITY): Payer: Medicare Other

## 2012-03-18 MED ORDER — SORBITOL 70 % SOLN: 30.0000 mL | Freq: Two times a day (BID) | Status: DC | PRN

## 2012-03-18 MED ORDER — ALBUMIN HUMAN 25 % IV SOLN
12.5000 g | INTRAVENOUS | Status: DC | PRN
  Filled 2012-03-18: qty 50

## 2012-03-18 NOTE — Progress Notes (Signed)
Patient 87-88% on room air. Steele Berg RN

## 2012-03-18 NOTE — Clinical Social Work Note (Signed)
CSW advised by NP that patient not ready for discharge today. CSW contacted SW at [facility] and left a message and advised patient's son and husband. Contact also made with Dr. Lowell Guitar to talk with son and update him.  Genelle Bal, MSW, LCSW [phone]

## 2012-03-18 NOTE — Progress Notes (Signed)
Physical Therapy Treatment Patient Details Name: Miranda [name] MRN: [id number] DOB: [birth year] Today's Date: 03/18/2012 Time: 9629-5284 PT Time Calculation (min): 17 min  PT Assessment / Plan / Recommendation Comments on Treatment Session  Pt admitted with pulm edema progressing with mobility today. Pt encouraged to continue mobility with nursing staff and to perform bil LE HEP throughout day to increase strength and decrease fatigue. Will continue to follow.     Follow Up Recommendations       Barriers to Discharge        Equipment Recommendations       Recommendations for Other Services    Frequency     Plan Discharge plan remains appropriate;Frequency remains appropriate    Precautions / Restrictions Precautions Precautions: Fall   Pertinent Vitals/Pain HR 78 and sats 96% at rest With 15' amb on RA sats dropped to 89% After ambulation at rest on 1L sats 98%    Mobility  Bed Mobility Bed Mobility: Not assessed Transfers Transfers: Sit to Stand;Stand to Sit Sit to Stand: 5: Supervision;From chair/3-in-1 Stand to Sit: 5: Supervision;To chair/3-in-1 Details for Transfer Assistance: cueing for hand placement and safety from recliner and [facility] Ambulation/Gait Ambulation/Gait Assistance: 4: Min guard Ambulation Distance (Feet): 70 Feet (15' then 37' after BSC) Assistive device: Rolling walker Ambulation/Gait Assistance Details: cueing for posture and to step into RW Gait Pattern: Step-through pattern;Decreased stride length;Trunk flexed Stairs: No    Exercises General Exercises - Lower Extremity Long Arc Quad: AROM;Both;20 reps;Seated Hip ABduction/ADduction: AROM;Both;20 reps;Seated Hip Flexion/Marching: AROM;Both;20 reps;Seated   PT Diagnosis:    PT Problem List:   PT Treatment Interventions:     PT Goals Acute Rehab PT Goals PT Goal: Sit to Stand - Progress: Progressing toward goal PT Transfer Goal: Bed to Chair/Chair to Bed - Progress: Progressing toward goal PT  Goal: Ambulate - Progress: Progressing toward goal  Visit Information  Last PT Received On: 03/18/12 Assistance Needed: +1    Subjective Data  Subjective: I just feel so weak   Cognition  Overall Cognitive Status: Appears within functional limits for tasks assessed/performed Arousal/Alertness: Awake/alert Orientation Level: Appears intact for tasks assessed Behavior During Session: [facility] for tasks performed    Balance     End of Session PT - End of Session Equipment Utilized During Treatment: Gait belt Activity Tolerance: Patient tolerated treatment well Patient left: in chair;with call bell/phone within reach Nurse Communication: Mobility status   GP     [name] 03/18/2012, 11:34 AM [name], PT [phone]

## 2012-03-18 NOTE — Progress Notes (Signed)
Subjective:  Feeling much better and ready for discharge; sitting up in recliner eating breakfast; plans for [facility]hab [facility]  Vital signs in last 24 hours: Filed Vitals:   03/17/12 1249 03/17/12 2200 03/18/12 0300 03/18/12 0514  BP: 152/55 160/65  166/74  Pulse: 82 77  82  Temp: 98.4 F (36.9 C) 98 F (36.7 C)  98.7 F (37.1 C)  TempSrc: Oral Oral  Oral  Resp: 20 18  20   Height:      Weight:   59.693 kg (131 lb 9.6 oz)   SpO2: 93% 92%  94%   Weight change: 1.193 kg (2 lb 10.1 oz) No intake or output data in the 24 hours ending 03/18/12 0829 Labs: Basic Metabolic Panel:  Lab 03/16/12 1610 03/14/12 0708 03/13/12 0500 03/12/12 1611  NA 143 140 140 --  K 3.9 3.1* 3.6 --  CL 102 100 101 --  CO2 27 25 21  --  GLUCOSE 73 87 75 --  BUN 32* 34* 47* --  CREATININE 3.08* 3.21* 3.66* --  CALCIUM 9.4 8.6 8.5 --  ALB -- -- -- --  PHOS 3.0 4.0 -- 5.0*   Liver Function Tests:  Lab 03/16/12 0700 03/14/12 0708 03/13/12 0500  AST -- -- 22  ALT -- -- 14  ALKPHOS -- -- 76  BILITOT -- -- 0.3  PROT -- -- 5.4*  ALBUMIN 2.3* 2.3* 2.2*   No results found for this basename: LIPASE:3,AMYLASE:3 in the last 168 hours No results found for this basename: AMMONIA:3 in the last 168 hours CBC:  Lab 03/16/12 0700 03/15/12 1409 03/14/12 0708 03/13/12 0500  WBC 16.3* 18.1* 17.1* --  NEUTROABS -- -- -- --  HGB 8.9* 9.3* 8.6* --  HCT 30.2* 30.8* 27.9* --  MCV 95.9 96.0 92.4 92.9  PLT 250 243 310 --   Cardiac Enzymes: No results found for this basename: CKTOTAL:5,CKMB:5,CKMBINDEX:5,TROPONINI:5 in the last 168 hours CBG: No results found for this basename: GLUCAP:5 in the last 168 hours  Iron Studies: No results found for this basename: IRON,TIBC,TRANSFERRIN,FERRITIN in the last 72 hours Studies/Results: Dg Chest 2 View  03/16/2012  Maree Krabbe, MD     03/16/2012 10:39 AM I was present at this dialysis session. I have reviewed the  session itself and made appropriate changes.   Vinson Moselle, MD [employer] 03/16/2012, 10:39 AM     Medications:      . allopurinol  100 mg Oral BID  . aspirin EC  81 mg Oral Daily  . atorvastatin  40 mg Oral q1800  . calcium acetate  667 mg Oral TID WC  . darbepoetin (ARANESP) injection - DIALYSIS  60 mcg Intravenous Q Tue-HD  . docusate sodium  100 mg Oral BID  . feeding supplement (NEPRO CARB STEADY)  237 mL Oral TID BM  . ferric gluconate (FERRLECIT/NULECIT) IV  125 mg Intravenous Daily  . multivitamin  1 tablet Oral QHS  . pantoprazole  40 mg Oral Q1200  . polyethylene glycol  17 g Oral Daily  . predniSONE  5 mg Oral Daily  . sertraline  50 mg Oral Daily    I  have reviewed scheduled and prn medications.  Physical Exam  General: alert, pleasant; NAD  Heart: RRR 3/6 murmur  Lungs: diminished bilat with very faint rales scattered left base  Abdomen: soft, NT/ND, +BS  Extremities: trace BLE; resolving petechial erythema left foot  Dialysis Access: RIJ PC; maturing RUA AVF +T/B   Dialysis Orders:  Center: [facility] on TTS. Dr. Eliott Nine  EDW 66kg HD Bath 2K/2.25Ca+ Time 2hrs x 1 wk, 3hr x1 wk, then 3.5hrs Heparin tight. Access RIJ PC; maturing RUA AVF BFR 150-400 DFR A1.5  Zemplar 2 mcg IV/HD Epogen 15,000 Units IV/HD Venofer 100mg  IV qtx x 10  Other   Assessment/Plan:  1. New ESRD - clipped to TTS [facility]; volume status significantly improved with aggressive fluid removal; scheduled to start outpatient HD 10:30 tomorrow at [facility]; SW confirmed all arrangements including transportation done. 2. ID/LLE Cellulitis/leukocytosis- improved (now 16.3); CXR 7/20 with report unavailable at this time; afebrile with pneumonia unlikely; blood cultures NTD; urine culture negative; on IV Vanc 7-10 d (first dose 7/18); 3rd dose tomorrow; Needs Vanc Trough (goal 15-20)  3. Volume overload/Hypoxia/pulm edema/pleural effusion- significant decrease in BLE edema since admitted; new eDW 60kg (previously 66kg) for now; still wearing oxygen;  oxygen not D/C'd as previously noted; check O2 sat RA prior to discharge.  4. Aortic stenosis- severe AS on ECHO; nl LV function 65-70% EF; seen by cardiology Dr.Skains (discussed with Dr. Earl Gala); surgery not recommended 5. Hypokalemia- low K during hosp and using 4K bath; K level 3.9 on 7/20; previous orders 2K/2.25 ca in outpatient setting; will use 3K and follow closely 6. Vascular Access- maturing RUA AVF placed 02/28/12 Dr. Imogene Burn but not ready for use; RIJ PC placed Tu 7/16 Dr. Edilia Bo; no further bleeding around cath site  7. Hypertension/volume- holding BP meds (Norvasc, Labetalol). Unable to tolerate UF goal >2L; suspect secondary to severe AS; If BP meds needed in the future, Dr. Anne Fu felt either norvasc or labetolol would be reasonable 8. Nutrition- albumin 2.3; ^protein renal diet and suppl; RD to evaluate ONSP in outpatient setting 9. Anemia - Hgb down to 8.9 on 7/20 (prev 9.3)- Aranesp x 1 on 7/16; iron bolus remains in progress- started adm 7/16 x 10; change back to epogen in outpatient setting and ^ dose (20,000) then protocol 10. Metabolic bone disease - Ca x P ok on phoslo 667mg , 1 with meals, continue Zemplar ;  11. DJD/Gout/spinal Stenosis- on prednisone 5mg /d 12. Constipation- improved with Miralax; will add sorbitol BID prn for SNF to use if fluid issue with Miralax use  13. Disposition/Deconditioning- ongoing PT/OT at [facility] (SNF) today; will need O2 sat check on room air prior; will arrange  Samuel Germany, FNP-C [facility] Pager [phone]  03/18/2012,8:29 AM  LOS: 6 days    Addendum: O2 at 87-88% on room air; obtain stat portable CXR and confirm no further pleural effusions or pulmonary edema prior to discharge; Noting these findings, discharge plans may be cancelled for today.   Addendum: CXR questionable still has fluid per Dr. Lowell Guitar; Discharge cancelled; HD tomorrow 4 hrs with UF goal 2-3L  Attending Note Agree with above as  written with editorial comments as required. She looks fairly comfortable but O2 sats and CXR finding warrant decision about optimum EDW and poss need for Home oxygen. HD in AM. Benito Lemmerman C

## 2012-03-18 NOTE — Progress Notes (Signed)
ANTIBIOTIC CONSULT NOTE - FOLLOW UP  Pharmacy Consult for Vancomycin Indication: cellulitis  Allergies  Allergen Reactions  . Sulfa Antibiotics Other (See Comments)    Unknown.  "Long time ago"    Patient Measurements: Height: 5\' 1"  (154.9 cm) Weight: 131 lb 9.6 oz (59.693 kg) IBW/kg (Calculated) : 47.8   Vital Signs: Temp: 98.7 F (37.1 C) (07/22 0514) Temp src: Oral (07/22 0514) BP: 166/74 mmHg (07/22 0514) Pulse Rate: 82  (07/22 0514) Intake/Output from previous day: 07/21 0701 - 07/22 0700 In: -  Out: 25 [Urine:25] Intake/Output from this shift:    Labs:  Basename 03/16/12 0700 03/15/12 1409  WBC 16.3* 18.1*  HGB 8.9* 9.3*  PLT 250 243  LABCREA -- --  CREATININE 3.08* --   Estimated Creatinine Clearance: 10.7 ml/min (by C-G formula based on Cr of 3.08). No results found for this basename: VANCOTROUGH:2,VANCOPEAK:2,VANCORANDOM:2,GENTTROUGH:2,GENTPEAK:2,GENTRANDOM:2,TOBRATROUGH:2,TOBRAPEAK:2,TOBRARND:2,AMIKACINPEAK:2,AMIKACINTROU:2,AMIKACIN:2, in the last 72 hours   Microbiology: Recent Results (from the past 720 hour(s))  SURGICAL PCR SCREEN     Status: Abnormal   Collection Time   02/28/12  6:30 AM      Component Value Range Status Comment   MRSA, PCR NEGATIVE  NEGATIVE Final    Staphylococcus aureus POSITIVE (*) NEGATIVE Final   CULTURE, BLOOD (ROUTINE X 2)     Status: Normal (Preliminary result)   Collection Time   03/14/12 11:45 AM      Component Value Range Status Comment   Specimen Description BLOOD LEFT HAND   Final    Special Requests BOTTLES DRAWN AEROBIC ONLY 4CC   Final    Culture  Setup Time 03/14/2012 18:20   Final    Culture     Final    Value:        BLOOD CULTURE RECEIVED NO GROWTH TO DATE CULTURE WILL BE HELD FOR 5 DAYS BEFORE ISSUING A FINAL NEGATIVE REPORT   Report Status PENDING   Incomplete   CULTURE, BLOOD (ROUTINE X 2)     Status: Normal (Preliminary result)   Collection Time   03/14/12 12:00 PM      Component Value Range Status  Comment   Specimen Description BLOOD RIGHT HAND   Final    Special Requests BOTTLES DRAWN AEROBIC ONLY 5CC   Final    Culture  Setup Time 03/14/2012 18:20   Final    Culture     Final    Value:        BLOOD CULTURE RECEIVED NO GROWTH TO DATE CULTURE WILL BE HELD FOR 5 DAYS BEFORE ISSUING A FINAL NEGATIVE REPORT   Report Status PENDING   Incomplete   URINE CULTURE     Status: Normal   Collection Time   03/14/12  3:21 PM      Component Value Range Status Comment   Specimen Description URINE, RANDOM   Final    Special Requests ADDED ON 03/15/2012 AT 1240   Final    Culture  Setup Time 03/15/2012 13:00   Final    Colony Count 85,000 COLONIES/ML   Final    Culture     Final    Value: Multiple bacterial morphotypes present, none predominant. Suggest appropriate recollection if clinically indicated.   Report Status 03/16/2012 FINAL   Final     Anti-infectives     Start     Dose/Rate Route Frequency Ordered Stop   03/15/12 1200   vancomycin (VANCOCIN) 750 mg in sodium chloride 0.9 % 150 mL IVPB  Status:  Discontinued  750 mg 150 mL/hr over 60 Minutes Intravenous Every M-W-F (Hemodialysis) 03/15/12 1035 03/15/12 1044   03/15/12 1200   vancomycin (VANCOCIN) 750 mg in sodium chloride 0.9 % 150 mL IVPB     Comments: Give after HD      750 mg 150 mL/hr over 60 Minutes Intravenous  Once 03/15/12 1044 03/15/12 1546   03/14/12 1330   vancomycin (VANCOCIN) 1,250 mg in sodium chloride 0.9 % 250 mL IVPB        1,250 mg 166.7 mL/hr over 90 Minutes Intravenous  Once 03/14/12 1315 03/14/12 1850   03/12/12 0833   cefUROXime (ZINACEF) 1.5 g in dextrose 5 % 50 mL IVPB        1.5 g 100 mL/hr over 30 Minutes Intravenous 30 min pre-op 03/12/12 2956 03/12/12 1400          Assessment: 78 y/o female patient receiving day #4 vancomycin for cellulitis. No further vanc doses scheduled as patient is new to HD. If receive HD tomorrow will evaluate treatment length and need for additional vancomycin.  Patient has received load dose and 1 maintenance dose. Remains afebrile.  Goal of Therapy:  Pre-HD vanc level 15-25  Plan:  Follow up next HD session.  Verlene Mayer, PharmD, BCPS Pager [phone] 03/18/2012,9:20 AM

## 2012-03-19 ENCOUNTER — Inpatient Hospital Stay (HOSPITAL_COMMUNITY): Payer: Medicare Other

## 2012-03-19 LAB — CBC
MCH: 29.3 pg (ref 26.0–34.0)
Platelets: 202 10*3/uL (ref 150–400)
RBC: 3.21 MIL/uL — ABNORMAL LOW (ref 3.87–5.11)

## 2012-03-19 LAB — RENAL FUNCTION PANEL
CO2: 26 mEq/L (ref 19–32)
Calcium: 9.3 mg/dL (ref 8.4–10.5)
GFR calc Af Amer: 12 mL/min — ABNORMAL LOW (ref >90)
GFR calc non Af Amer: 10 mL/min — ABNORMAL LOW (ref >90)
Phosphorus: 3.9 mg/dL (ref 2.3–4.6)
Potassium: 3.8 mEq/L (ref 3.5–5.1)
Sodium: 137 mEq/L (ref 135–145)

## 2012-03-19 MED ORDER — DARBEPOETIN ALFA-POLYSORBATE 60 MCG/0.3ML IJ SOLN
INTRAMUSCULAR | Status: AC
  Administered 2012-03-19: 60 ug via INTRAVENOUS
  Filled 2012-03-19: qty 0.3

## 2012-03-19 MED ORDER — VANCOMYCIN HCL 1000 MG IV SOLR: 750.0000 mg | INTRAVENOUS | Status: AC

## 2012-03-19 MED ORDER — LIDOCAINE-PRILOCAINE 2.5-2.5 % EX CREA: 1.0000 "application " | TOPICAL_CREAM | CUTANEOUS | Status: DC | PRN

## 2012-03-19 MED ORDER — RENA-VITE PO TABS: 1.0000 | ORAL_TABLET | Freq: Every day | ORAL | Status: DC

## 2012-03-19 MED ORDER — VANCOMYCIN HCL 1000 MG IV SOLR
750.0000 mg | INTRAVENOUS | Status: DC
  Administered 2012-03-19: 750 mg via INTRAVENOUS
  Filled 2012-03-19: qty 750

## 2012-03-19 MED ORDER — NEPRO/CARBSTEADY PO LIQD: 237.0000 mL | Freq: Three times a day (TID) | ORAL | Status: DC

## 2012-03-19 NOTE — Procedures (Signed)
On dialysis. Trying to optimize volume status and BP issues.  Will reassess need for home O2.  Bp down to 92/45 with treatment currently with > 3 L removed.  Will recheck pulse ox off O2.  Has AVF right arm, usin PC. Garek Schuneman C

## 2012-03-19 NOTE — Progress Notes (Signed)
Physical Therapy Treatment Patient Details Name: Miranda [name] MRN: [id number] DOB: [birth year] Today's Date: 03/19/2012 Time: 4098-1191 PT Time Calculation (min): 20 min  PT Assessment / Plan / Recommendation Comments on Treatment Session  Pt admitted with pulm edema and progressing with mobility but not as successfully as prior session due to wrist pain. Pt plans discharge to SNF today and encouraged to continue mobility and HEP to increase activity tolerance and strength. Will follow.     Follow Up Recommendations       Barriers to Discharge        Equipment Recommendations       Recommendations for Other Services    Frequency     Plan Discharge plan remains appropriate;Frequency remains appropriate    Precautions / Restrictions Precautions Precautions: Fall Precaution Comments: urinary incontinence   Pertinent Vitals/Pain Wrist pain right 5/10    Mobility  Bed Mobility Bed Mobility: Supine to Sit;Sit to Supine Supine to Sit: 4: Min assist;With rails;HOB flat Sit to Supine: 4: Min assist;HOB flat Details for Bed Mobility Assistance: assist to bring bil LE onto surface of bed, cueing for sequence Transfers Transfers: Sit to Stand;Stand to Sit Sit to Stand: 4: Min assist;From bed Stand to Sit: To bed;4: Min guard Details for Transfer Assistance: cueing for hand placement and safety and assist to complete elevation from surface due to sore right wrist Ambulation/Gait Ambulation/Gait Assistance: 4: Min assist Ambulation Distance (Feet): 30 Feet Assistive device: 1 person hand held assist Ambulation/Gait Assistance Details: Pt with right wrist pain impairing her ability to use RW with ambulation today and pt also deferred further distance due to wrist pain Gait Pattern: Step-through pattern;Trunk flexed;Narrow base of support;Decreased stride length Gait velocity: decreased    Exercises General Exercises - Lower Extremity Long Arc Quad: AROM;Both;20 reps;Seated Hip  Flexion/Marching: AROM;Both;20 reps;Seated   PT Diagnosis:    PT Problem List:   PT Treatment Interventions:     PT Goals Acute Rehab PT Goals PT Goal: Supine/Side to Sit - Progress: Progressing toward goal PT Goal: Sit to Supine/Side - Progress: Progressing toward goal PT Goal: Sit to Stand - Progress: Progressing toward goal PT Transfer Goal: Bed to Chair/Chair to Bed - Progress: Progressing toward goal PT Goal: Ambulate - Progress: Progressing toward goal  Visit Information  Last PT Received On: 03/19/12 Assistance Needed: +1    Subjective Data  Subjective: I've had a bad day with my right arm   Cognition  Overall Cognitive Status: Impaired Area of Impairment: Safety/judgement Arousal/Alertness: Awake/alert Orientation Level: Appears intact for tasks assessed Behavior During Session: [facility] for tasks performed Safety/Judgement: Decreased awareness of need for assistance    Balance     End of Session PT - End of Session Equipment Utilized During Treatment: Gait belt Activity Tolerance: Patient tolerated treatment well Patient left: in bed;with call bell/phone within reach;with family/visitor present   GP     Toney Sang Beth 03/19/2012, 2:59 PM [name], PT [phone]

## 2012-03-19 NOTE — Progress Notes (Signed)
Gave report to Mia at [facility] for discharge. IV has been taken out, ambulance for transport is being called.

## 2012-03-19 NOTE — Progress Notes (Signed)
ANTIBIOTIC CONSULT NOTE - FOLLOW UP  Pharmacy Consult for Vancomycin Indication: Cellulitis  Allergies  Allergen Reactions  . Sulfa Antibiotics Other (See Comments)    Unknown.  "Long time ago"    Patient Measurements: Height: 5\' 1"  (154.9 cm) Weight: 131 lb 13.4 oz (59.8 kg) IBW/kg (Calculated) : 47.8    Vital Signs: Temp: 97.4 F (36.3 C) (07/23 1006) Temp src: Oral (07/23 1006) BP: 102/61 mmHg (07/23 1006) Pulse Rate: 74  (07/23 1006) Intake/Output from previous day: 07/22 0701 - 07/23 0700 In: 240 [P.O.:240] Out: -  Intake/Output from this shift: Total I/O In: -  Out: 2311 [Other:2311]  Labs:  [facility] 03/19/12 0456  WBC 17.6*  HGB 9.4*  PLT 202  LABCREA --  CREATININE 3.66*   Estimated Creatinine Clearance: 9 ml/min (by C-G formula based on Cr of 3.66). No results found for this basename: VANCOTROUGH:2,VANCOPEAK:2,VANCORANDOM:2,GENTTROUGH:2,GENTPEAK:2,GENTRANDOM:2,TOBRATROUGH:2,TOBRAPEAK:2,TOBRARND:2,AMIKACINPEAK:2,AMIKACINTROU:2,AMIKACIN:2, in the last 72 hours   Microbiology: Recent Results (from the past 720 hour(s))  SURGICAL PCR SCREEN     Status: Abnormal   Collection Time   02/28/12  6:30 AM      Component Value Range Status Comment   MRSA, PCR NEGATIVE  NEGATIVE Final    Staphylococcus aureus POSITIVE (*) NEGATIVE Final   CULTURE, BLOOD (ROUTINE X 2)     Status: Normal (Preliminary result)   Collection Time   03/14/12 11:45 AM      Component Value Range Status Comment   Specimen Description BLOOD LEFT HAND   Final    Special Requests BOTTLES DRAWN AEROBIC ONLY 4CC   Final    Culture  Setup Time 03/14/2012 18:20   Final    Culture     Final    Value:        BLOOD CULTURE RECEIVED NO GROWTH TO DATE CULTURE WILL BE HELD FOR 5 DAYS BEFORE ISSUING A FINAL NEGATIVE REPORT   Report Status PENDING   Incomplete   CULTURE, BLOOD (ROUTINE X 2)     Status: Normal (Preliminary result)   Collection Time   03/14/12 12:00 PM      Component Value Range Status  Comment   Specimen Description BLOOD RIGHT HAND   Final    Special Requests BOTTLES DRAWN AEROBIC ONLY 5CC   Final    Culture  Setup Time 03/14/2012 18:20   Final    Culture     Final    Value:        BLOOD CULTURE RECEIVED NO GROWTH TO DATE CULTURE WILL BE HELD FOR 5 DAYS BEFORE ISSUING A FINAL NEGATIVE REPORT   Report Status PENDING   Incomplete   URINE CULTURE     Status: Normal   Collection Time   03/14/12  3:21 PM      Component Value Range Status Comment   Specimen Description URINE, RANDOM   Final    Special Requests ADDED ON 03/15/2012 AT 1240   Final    Culture  Setup Time 03/15/2012 13:00   Final    Colony Count 85,000 COLONIES/ML   Final    Culture     Final    Value: Multiple bacterial morphotypes present, none predominant. Suggest appropriate recollection if clinically indicated.   Report Status 03/16/2012 FINAL   Final     Anti-infectives     Start     Dose/Rate Route Frequency Ordered Stop   03/15/12 1200   vancomycin (VANCOCIN) 750 mg in sodium chloride 0.9 % 150 mL IVPB  Status:  Discontinued  750 mg 150 mL/hr over 60 Minutes Intravenous Every M-W-F (Hemodialysis) 03/15/12 1035 03/15/12 1044   03/15/12 1200   vancomycin (VANCOCIN) 750 mg in sodium chloride 0.9 % 150 mL IVPB     Comments: Give after HD      750 mg 150 mL/hr over 60 Minutes Intravenous  Once 03/15/12 1044 03/15/12 1546   03/14/12 1330   vancomycin (VANCOCIN) 1,250 mg in sodium chloride 0.9 % 250 mL IVPB        1,250 mg 166.7 mL/hr over 90 Minutes Intravenous  Once 03/14/12 1315 03/14/12 1850   03/12/12 0833   cefUROXime (ZINACEF) 1.5 g in dextrose 5 % 50 mL IVPB        1.5 g 100 mL/hr over 30 Minutes Intravenous 30 min pre-op 03/12/12 7846 03/12/12 1400          Assessment: Patient is an 78 y.o. F on vancomycin day #5 for LLE cellulitis with plan to treat for 7-10 days (per Dr. Roanna Banning note). Patient is afebrile but WBC is still elevated at 17.6.  UCX from 7/18 with multi bact and  BCXs are NGTD.   S/p HD today for 4 hrs with BFR~300.  Patient appears to be now on TTS HD schedule.  Goal of Therapy:  Pre-HD vancomycin level= 15-25  Plan:  1) Will schedule vancomycin 750mg  to be given after each HD (TTS). 2) Will plan on checking level at the end of the week if patient is still on vancomycin therapy.  Yaziel Brandon P 03/19/2012,12:58 PM

## 2012-03-19 NOTE — Clinical Social Work Note (Signed)
Patient medically stable today for discharge to [facility] skilled nursing facility for short-term rehab. Discharge information forwarded to facility. Family has been made aware of discharge and is in agreement with CSW facilitating transport via ambulance.  Genelle Bal, MSW, LCSW [phone]

## 2012-03-19 NOTE — Discharge Summary (Signed)
Physician Discharge Summary  Patient ID: Miranda [name] MRN: [id number] DOB/AGE: 11-27-1921 78 y.o.  Admit date: 03/12/2012 Discharge date: 03/19/2012  Consults:  Dr. Donato Schultz   Cardio  Treatments: Hemodialysis Discharge Diagnoses: See Below Present on Admission:  .Pulmonary edema .Volume excess, primary renal sodium retention  Hospital Course:Miranda [name] is a 78 y.o. 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 Marina Gravel since 12/2004 and when seen recently in the office on 03/11/12 complained of progressively increasing shortness of breath with lower extremity edema and uremic symptoms. It was then determined that she had reached ESRD and the need to initiate hemodialysis was made. She was scheduled to have a tunneled HD cath placed today and her first outpatient HD treatment at [facility]. Unfortunately, she was hypoxic and with a CXR revealing increasing pulmonary vascular congestion, she was admitted for urgent hemodialysis and again with serial treatments for   correction of her hypoxia with fluid removal. It was determined by Dr. Lowell Guitar  she was stable for  discharged to resume additional volume removal at the outpatient HD unit at [facility] with a new EDW 58 kg.    It was noted during her admit workup for hypoxia she had severe Aortic Stenosis by echo. Dr. Anne Fu  With  cardiology consulted stating "her peak velocity is 3.3 m/s with a mean gradient of 29 mmHg. Both of these values are in the moderate range. Because of this, I would not recommend surgery at this point. We would continue to monitor.This was discussed with Dr. Earl Gala. "   Her shortness of breath was much better since hemodialysis was started . Her O2 Sat was down to 98% on room air the day of dc.  Her Discharge Diagnosis Issues are as listed:   New ESRD - clipped to TTS [facility]; volume status significantly improved with  aggressive fluid removal; scheduled to start outpatient HD 10:30  Thursday July 25 at [facility]; SW confirmed all arrangements including transportation done.   ID/LLE Cellulitis/leukocytosis- improved  Will complete 2weeks of Vancomycin 750mg   At out patient  August 1, 20113 ; afebrile ; blood cultures NTD; urine culture negative.   Volume overload/Hypoxia/pulm edema/pleural effusion- significant decrease in BLE edema since admitted; new EDW 58kg (previously 66kg) for now.   Aortic stenosis- severe AS on ECHO; nl LV function 65-70% EF; seen by cardiology Dr.Skains (discussed with Dr. Earl Gala); surgery not recommended   Hypokalemia- low K during hosp and using 3K bath; K level 3.9 on 7/20; previous orders 2K/2.25 ca in outpatient setting; will use 3K and follow closely   Vascular Access- maturing RUA AVF placed 02/28/12 Dr. Imogene Burn but not ready for use; RIJ PC placed Tu 7/16 Dr. Edilia Bo; no further bleeding around cath site   Hypertension/volume-Stopping BP meds (Norvasc, Labetalol). Unable to tolerate UF goal >2L; suspect secondary to severe AS; If BP meds needed in the future, Dr. Anne Fu felt either norvasc or labetolol would be reasonable   Nutrition- albumin 2.3; ^protein renal diet and suppl; RD to evaluate ONSP in outpatient setting   Anemia - Hgb down to 8.9 on 7/20 (prev 9.3)- Aranesp x 1 on 7/16; iron bolus remains in progress- started adm 7/16 x 10; change back to epogen in outpatient setting At 15,000 units at hd then protocol   Metabolic bone disease - Ca x P ok on phoslo 667mg , 1 with meals, continue Zemplar  DJD/Gout/spinal Stenosis- on prednisone 5mg /d    Disposition/Deconditioning- ongoing PT/OT at [facility]        Diet: 90-2-2  1200cc fluids q day Discharge Medications:  Medication List  As of 03/19/2012  1:12 PM   STOP taking these medications         amLODipine 10 MG tablet      calcitRIOL 0.25 MCG capsule      furosemide 40 MG tablet      labetalol 100 MG  tablet      multivitamin-iron-minerals-folic acid chewable tablet      SODIUM BICARBONATE (ANTACID) PO      sodium polystyrene 15 GM/60ML suspension         TAKE these medications         acetaminophen 500 MG tablet   Commonly known as: TYLENOL   Take 500 mg by mouth every 6 (six) hours as needed. For pain      allopurinol 100 MG tablet   Commonly known as: ZYLOPRIM   Take 100 mg by mouth 2 (two) times daily.      aspirin EC 81 MG tablet   Take 81 mg by mouth daily.      atorvastatin 40 MG tablet   Commonly known as: LIPITOR   Take 40 mg by mouth daily.      calcium carbonate 600 MG Tabs   Commonly known as: OS-CAL   Take 600 mg by mouth 2 (two) times daily with a meal.      feeding supplement (NEPRO CARB STEADY) Liqd   Take 237 mLs by mouth 3 (three) times daily between meals.      glucosamine-chondroitin 500-400 MG tablet   Take 1 tablet by mouth 3 (three) times daily.      HYDROcodone-acetaminophen 5-500 MG per tablet   Commonly known as: VICODIN   Take 0.5 tablets by mouth every 6 (six) hours as needed. For pain      lidocaine-prilocaine cream   Commonly known as: EMLA   Apply 1 application topically as needed (topical anesthesia for hemodialysis if Gebauers and Lidocaine injection are ineffective.).      multivitamin Tabs tablet   Take 1 tablet by mouth daily.      oxyCODONE-acetaminophen 5-325 MG per tablet   Commonly known as: PERCOCET/ROXICET   Take 1-2 tablets by mouth every 4 (four) hours as needed for pain.      pantoprazole 40 MG tablet   Commonly known as: PROTONIX   Take 40 mg by mouth daily.      predniSONE 5 MG tablet   Commonly known as: DELTASONE   Take 5 mg by mouth daily.      sertraline 50 MG tablet   Commonly known as: ZOLOFT   Take 50 mg by mouth daily.            Disposition:Improved} Discharge Orders    Future Appointments: Provider: Department: Dept Phone: Center:   03/21/2012 12:00 PM Windell Hummingbird [facility]  [phone] None   03/21/2012 12:30 PM Pierce Crane, MD [facility] [phone] None   03/29/2012 10:00 AM Fransisco Hertz, MD [facility] [phone]   04/11/2012 1:45 PM Windell Hummingbird [facility] [phone] None   04/11/2012 2:15 PM [facility] [phone] None   05/16/2012 1:45 PM Windell Hummingbird [facility] [phone] None   05/16/2012 2:15 PM [facility] [phone] None   06/13/2012 1:45 PM Windell Hummingbird [facility] [phone] None  06/13/2012 2:15 PM [facility] [phone] None   07/18/2012 2:30 PM Pierce Crane, MD [facility] [phone] None     Future Orders Please Complete By Expires   Renal diet - Limit fluids to 1200 ml/day      Continue to follow the Renal Care Notes you received during your hospital stay      Record daily weight on same scale at same time of day after urinating and before breakfast      Bring all medications to your doctor's appointment      Do not skip any hemodialysis appointments unless directed by your doctor      Avoid straining      STOP ANY ACTIVITY THAT CAUSES CHEST PAIN, SHORTNESS OF BREATH, DIZZINESS, SWEATING OR EXCESSIVE WEAKNESS      CALL 911 for chest discomfort not relieved by NTG or lasting longer than 20 minutes      Call doctor for more than 3-4 kg weight gain between hemodialysis treatments      Call doctor for shortness of breath, with or without a dry hacking cough      Call doctor for swellling in the hands, feet or stomach not improved after hemodialysis      Call doctor if you have to sleep on extra pillows at night in order to breathe      Call doctor for fainting or near blackouts      Call doctor for chest discomfort that is more frequent or severe      Activity as tolerated      No Wound Care           Discharge Vital Signs and Labs: Temp:  [97 F (36.1 C)-99.5 F (37.5 C)] 97.4 F (36.3 C) (07/23 1006) Pulse Rate:  [68-82]  74  (07/23 1006) Resp:  [10-25] 20  (07/23 1006) BP: (83-170)/(39-74) 102/61 mmHg (07/23 1006) SpO2:  [93 %-98 %] 93 % (07/23 1006) Weight:  [58.968 kg (130 lb)-59.8 kg (131 lb 13.4 oz)] 59.8 kg (131 lb 13.4 oz) (07/23 0454)  Basename 03/19/12 0456  WBC 17.6*  HGB 9.4*  HCT 30.7*  PLT 202   Renal:  Basename 03/19/12 0456  NA 137  K 3.8  CL 97  CO2 26  GLUCOSE 80  BUN 52*  CREATININE 3.66*  CALCIUM 9.3  PHOS 3.9  ALBUMIN 2.1*   Iron Studies: No results found for this basename: IRON,TIBC,TRANSFERRIN,FERRITIN in the last 72 hours  65 minutes were spent completing this discharge summary, discharge med reconciliation, discharge patient instructions and communicating discharge information to the patient's dialysis center.  Lenny Pastel, PA-C [facility] Beeper [phone] 03/19/2012, 1:12 PM  Agree with the summary of the clinical course as outlined above. Kristalyn Bergstresser C

## 2012-03-20 LAB — CULTURE, BLOOD (ROUTINE X 2)

## 2012-03-21 ENCOUNTER — Ambulatory Visit: Payer: Medicare Other | Admitting: Oncology

## 2012-03-21 ENCOUNTER — Other Ambulatory Visit: Payer: Medicare Other

## 2012-03-22 ENCOUNTER — Telehealth: Payer: Self-pay | Admitting: Oncology

## 2012-03-22 NOTE — Telephone Encounter (Signed)
Pt's son Chapel Silverthorn called and cx'd all appts. Per Merlyn Albert pt is now on dialysis and the nephrologist is considering those needs (lb/inj). Fred also cx'd f/u w/PR. Message to PR via outlook.

## 2012-03-28 ENCOUNTER — Encounter: Payer: Self-pay | Admitting: Vascular Surgery

## 2012-03-29 ENCOUNTER — Encounter: Payer: Self-pay | Admitting: Vascular Surgery

## 2012-03-29 ENCOUNTER — Ambulatory Visit (INDEPENDENT_AMBULATORY_CARE_PROVIDER_SITE_OTHER): Payer: Medicare Other | Admitting: Vascular Surgery

## 2012-03-29 VITALS — BP 121/44 | HR 83 | Temp 98.3°F | Ht 61.0 in | Wt 126.0 lb

## 2012-03-29 DIAGNOSIS — Z48812 Encounter for surgical aftercare following surgery on the circulatory system: Secondary | ICD-10-CM

## 2012-03-29 DIAGNOSIS — N186 End stage renal disease: Secondary | ICD-10-CM

## 2012-03-29 NOTE — Progress Notes (Signed)
[facility] [place]  Postoperative Access Visit  History of Present Illness  Miranda [name] is a 78 y.o. year old female who presents for postoperative follow-up for: R BC AVF (Date: 02/28/12).  The patient's wounds are healed.  The patient notes no steal symptoms.  The patient is able to complete their activities of daily living.  The patient's current symptoms are: none.  The pt has had a TDC placed since her procedure for HD.  Physical Examination  Filed Vitals:   03/29/12 0950  BP: 121/44  Pulse: 83  Temp: 98.3 F (36.8 C)   RUE: Incision is healed, skin feels warm, hand grip is 5/5, sensation in digits is intact, palpable thrill, bruit can be auscultated , On Sonosite: 5-6 mm in diameter, > 1 cm deep at some sites   Medical Decision Making  Miranda [name] is a 78 y.o. year old female who presents s/p R BC AVF.  Access duplex of R BC AVF in one month  I suspect the access will be mature in one month, my concern is whether it may be too deep at some site.  A superficialization procedure may be needed.  Thank you for allowing [place] to participate in this patient's care.  Leonides Sake, MD [facility] [place] Office: [phone] Pager: [phone]

## 2012-04-11 ENCOUNTER — Ambulatory Visit: Payer: Medicare Other

## 2012-04-11 ENCOUNTER — Other Ambulatory Visit: Payer: Medicare Other | Admitting: Lab

## 2012-05-02 ENCOUNTER — Encounter: Payer: Self-pay | Admitting: Vascular Surgery

## 2012-05-03 ENCOUNTER — Encounter: Payer: Self-pay | Admitting: Vascular Surgery

## 2012-05-03 ENCOUNTER — Ambulatory Visit (INDEPENDENT_AMBULATORY_CARE_PROVIDER_SITE_OTHER): Payer: Medicare Other | Admitting: Vascular Surgery

## 2012-05-03 ENCOUNTER — Encounter (INDEPENDENT_AMBULATORY_CARE_PROVIDER_SITE_OTHER): Payer: Medicare Other | Admitting: *Deleted

## 2012-05-03 VITALS — BP 169/56 | HR 78 | Resp 16 | Ht 60.0 in | Wt 127.0 lb

## 2012-05-03 DIAGNOSIS — N186 End stage renal disease: Secondary | ICD-10-CM

## 2012-05-03 DIAGNOSIS — T82898A Other specified complication of vascular prosthetic devices, implants and grafts, initial encounter: Secondary | ICD-10-CM

## 2012-05-03 DIAGNOSIS — Z48812 Encounter for surgical aftercare following surgery on the circulatory system: Secondary | ICD-10-CM

## 2012-05-03 NOTE — Progress Notes (Signed)
[facility] [place]  Postoperative Access Visit  History of Present Illness  Lennon L Blizzard is a 78 y.o. year old female who presents for postoperative follow-up for: R BC AVF (Date: 02/28/12).  The patient's wounds are healed.  The patient notes no steal symptoms.  The patient is able to complete their activities of daily living.  The patient's current symptoms are: none.  Pt returns for RUE access duplex  Physical Examination  Filed Vitals:   05/03/12 1046  BP: 169/56  Pulse: 78  Resp: 16   RUE: Incision is healed, skin feels warm, hand grip is 5/5, sensation in digits is intact, palpable thrill, bruit can be auscultated   R access duplex (Date: 05/03/12)  Retain calcified valve in upper arm  Narrowed segment in mid-arm  Depth: 4.2-8.4 mm  Medical Decision Making  Marsa L Bashor is a 78 y.o. year old female who presents s/p R BC AVF.  I recommend R arm fistulogram, with likely cutting angioplasty of the valvular stenosis.  This scheduled for the 16 SEP 13 due to availability of family to transport the patient.  Based on the fistulogram, I will also make a decision in regards to the ability to salvage this fistula.    There are some concerns raised by the access duplex, but the access clinically appears to be better than the duplex findings.  Thank you for allowing [place] to participate in this patient's care.  Leonides Sake, MD [facility] [place] Office: [phone] Pager: [phone]

## 2012-05-06 ENCOUNTER — Encounter (HOSPITAL_COMMUNITY): Payer: Self-pay | Admitting: Pharmacy Technician

## 2012-05-07 ENCOUNTER — Other Ambulatory Visit: Payer: Self-pay

## 2012-05-13 ENCOUNTER — Telehealth: Payer: Self-pay | Admitting: Vascular Surgery

## 2012-05-13 ENCOUNTER — Encounter (HOSPITAL_COMMUNITY)
Admission: RE | Disposition: A | Discharge status: Home / Self Care | Payer: Self-pay | Source: Ambulatory Visit | Attending: Vascular Surgery

## 2012-05-13 ENCOUNTER — Ambulatory Visit (HOSPITAL_COMMUNITY)
Admission: RE | Admit: 2012-05-13 | Discharge: 2012-05-13 | Disposition: A | Discharge status: Home / Self Care | Payer: Medicare Other | Source: Ambulatory Visit | Attending: Vascular Surgery | Admitting: Vascular Surgery

## 2012-05-13 DIAGNOSIS — Y832 Surgical operation with anastomosis, bypass or graft as the cause of abnormal reaction of the patient, or of later complication, without mention of misadventure at the time of the procedure: Secondary | ICD-10-CM | POA: Insufficient documentation

## 2012-05-13 DIAGNOSIS — T82898A Other specified complication of vascular prosthetic devices, implants and grafts, initial encounter: Secondary | ICD-10-CM | POA: Insufficient documentation

## 2012-05-13 DIAGNOSIS — N189 Chronic kidney disease, unspecified: Secondary | ICD-10-CM | POA: Insufficient documentation

## 2012-05-13 DIAGNOSIS — I871 Compression of vein: Secondary | ICD-10-CM | POA: Insufficient documentation

## 2012-05-13 DIAGNOSIS — I129 Hypertensive chronic kidney disease with stage 1 through stage 4 chronic kidney disease, or unspecified chronic kidney disease: Secondary | ICD-10-CM | POA: Insufficient documentation

## 2012-05-13 HISTORY — PX: SHUNTOGRAM: SHX5491

## 2012-05-13 LAB — POCT I-STAT, CHEM 8
BUN: 47 mg/dL — ABNORMAL HIGH (ref 6–23)
Sodium: 134 mEq/L — ABNORMAL LOW (ref 135–145)
TCO2: 31 mmol/L (ref 0–100)

## 2012-05-13 SURGERY — ASSESSMENT, SHUNT FUNCTION, WITH CONTRAST RADIOGRAPHIC STUDY
Anesthesia: LOCAL

## 2012-05-13 MED ORDER — ONDANSETRON HCL 4 MG/2ML IJ SOLN: 4.0000 mg | Freq: Four times a day (QID) | INTRAMUSCULAR | Status: DC | PRN

## 2012-05-13 MED ORDER — LIDOCAINE HCL (PF) 1 % IJ SOLN
INTRAMUSCULAR | Status: AC
  Filled 2012-05-13: qty 30

## 2012-05-13 MED ORDER — ACETAMINOPHEN 325 MG PO TABS: 650.0000 mg | ORAL_TABLET | ORAL | Status: DC | PRN

## 2012-05-13 MED ORDER — SODIUM CHLORIDE 0.9 % IJ SOLN: 3.0000 mL | INTRAMUSCULAR | Status: DC | PRN

## 2012-05-13 MED ORDER — SODIUM CHLORIDE 0.9 % IV SOLN: 250.0000 mL | INTRAVENOUS | Status: DC | PRN

## 2012-05-13 MED ORDER — SODIUM CHLORIDE 0.9 % IJ SOLN: 3.0000 mL | Freq: Two times a day (BID) | INTRAMUSCULAR | Status: DC

## 2012-05-13 NOTE — Telephone Encounter (Signed)
Message copied by Fredrich Birks on Mon May 13, 2012 11:29 AM ------      Message from: Melene Plan      Created: Mon May 13, 2012 11:23 AM                   ----- Message -----         From: Fransisco Hertz, MD         Sent: 05/13/2012  11:12 AM           To: [name], Melene Plan, RN            Miranda [name]      [phone]      [birth year]            Procedure: R arm fistulogram            Follow-up: 4 wk

## 2012-05-13 NOTE — Op Note (Addendum)
OPERATIVE NOTE   PROCEDURE: 1. right brachiocephalic arteriovenous fistula cannulation under ultrasound guidance 2. 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 Widely patent brachiocephalic arteriovenous fistula throughout: 4.5 mm near anastomosis but 6 mm throughout rest of fistula 2. One side branch visualized in distal 1/3 3. Central venous structures widely patent even with tunneled dialysis catheter in place  SPECIMEN(S):  None  CONTRAST: 40 cc  INDICATIONS: Miranda [name] is a 78 y.o. female who  presents with malfunctioning right brachiocephalic arteriovenous fistula.  The patient is scheduled for right arm fistulogram.  The patient is aware the risks include but are not limited to: bleeding, infection, thrombosis of the cannulated access, and possible anaphylactic reaction to the contrast.  The patient is aware of the risks of the procedure and elects to proceed forward.  DESCRIPTION: After full informed written consent was obtained, the patient was brought back to the angiography suite and placed supine upon the angiography table.  The patient was connected to monitoring equipment.  The right arm was prepped and draped in the standard fashion for a right arm fistulogram.  Under ultrasound guidance, the right brachiocephalic arteriovenous fistula was cannulated with a micropuncture needle.  The microwire was advanced into the fistula and the needle was exchanged for the a microsheath, which was lodged 2 cm into the access.  The wire was removed and the sheath was connected to the IV extension tubing.  Hand injections were completed to image the access from the antecubitum up to the level of axilla.  The central venous structures were also imaged by hand injections.  Based on the  images, this patient will need: no immediate intervention.  The 4.5 mm segment near the anastomosis is desirable, as it will allow perfusion of the fistula but avoid steal syndrome.   A 4-0 Monocryl purse-string suture was sewn around the sheath.  The sheath was removed while tying down the suture.  A sterile bandage was applied to the puncture site.    The fistula has matured enough to start cannulation.  If there are difficulties with cannulation, there is one significant side branch that could be ligated.  COMPLICATIONS: none  CONDITION: stable  Leonides Sake, MD [facility] [place] Office: [phone] Pager: [phone]  05/13/2012 10:54 AM

## 2012-05-13 NOTE — Telephone Encounter (Signed)
LVM for patient and sent letter for pt reminder, dpm

## 2012-05-13 NOTE — H&P (Addendum)
[facility] [place]  Brief History and Physical  History of Present Illness  Miranda [name] is a 78 y.o. female who presents with chief complaint: stenosis in right brachiocephalic arteriovenous fistula .  The patient presents today for R arm fistulogram, possible intervention.    Past Medical History  Diagnosis Date  . Chronic kidney disease   . Hypertension   . Heart murmur     Dr Particia Lather follows. Not followed by a Cardiologist  . Cancer     Breast- Lumpectomy.  RadiationTx  . Anemia   . Gout     Past Surgical History  Procedure Date  . Parathyroidectomy   . Knee surgery     Right arthroscopy  . Breast lumpectomy     left  . Eye surgery     Cataract eyes  . Appendectomy   . Av fistula placement 02/28/2012    Procedure: ARTERIOVENOUS (AV) FISTULA CREATION;  Surgeon: Fransisco Hertz, MD;  Location: [facility] OR;  Service: Vascular;  Laterality: Right;  Ultrasound guided.  . Insertion of dialysis catheter 03/12/2012    Procedure: INSERTION OF DIALYSIS CATHETER;  Surgeon: Chuck Hint, MD;  Location: [facility] OR;  Service: Vascular;  Laterality: N/A;  Right Internal Jugular Placement    History   Social History  . Marital Status: Married    Spouse Name: N/A    Number of Children: N/A  . Years of Education: N/A   Occupational History  . Not on file.   Social History Main Topics  . Smoking status: Never Smoker   . Smokeless tobacco: Never Used  . Alcohol Use: No  . Drug Use: No  . Sexually Active: Not on file   Other Topics Concern  . Not on file   Social History Narrative  . No narrative on file    Family History  Problem Relation Age of Onset  . Hypertension Mother   . Stroke Mother     No current facility-administered medications on file prior to encounter.   Current Outpatient Prescriptions on File Prior to Encounter  Medication Sig Dispense Refill  . allopurinol (ZYLOPRIM) 100 MG tablet Take 100 mg by mouth 2 (two) times daily.        Marland Kitchen aspirin EC 81 MG tablet Take 81 mg by mouth daily.      Marland Kitchen atorvastatin (LIPITOR) 40 MG tablet Take 40 mg by mouth daily.       . calcium carbonate (OS-CAL) 600 MG TABS Take 600 mg by mouth 2 (two) times daily with a meal.       . HYDROcodone-acetaminophen (VICODIN) 5-500 MG per tablet Take 0.5 tablets by mouth every 6 (six) hours as needed. For pain      . Multiple Vitamins-Minerals (CERTAVITE SENIOR/ANTIOXIDANT PO) Take by mouth daily.      . pantoprazole (PROTONIX) 40 MG tablet Take 40 mg by mouth daily.       . predniSONE (DELTASONE) 5 MG tablet Take 5 mg by mouth daily.      . sertraline (ZOLOFT) 50 MG tablet Take 50 mg by mouth daily.        Allergies  Allergen Reactions  . Sulfa Antibiotics Other (See Comments)    Unknown.  "Long time ago"    Review of Systems: As listed above, otherwise negative.  Physical Examination  Filed Vitals:   05/13/12 1006 05/13/12 1012  BP: 167/72   Pulse:  88  Temp: 98.1 F (36.7 C)   Resp: 16  SpO2:  86%    General: A&O x 3, WDWN  Pulmonary: Sym exp, good air movt, CTAB, no rales, rhonchi, & wheezing  Cardiac: RRR, Nl S1, S2, no Murmurs, rubs or gallops  Gastrointestinal: soft, NTND, -G/R, - HSM, - masses, - CVAT B  Musculoskeletal: M/S 5/5 throughout , Extremities without ischemic changes , +thrill and bruit in right arm  Laboratory See iStat  Medical Decision Making  Miranda [name] is a 78 y.o. female who presents with: venous outflow stenosis in R BC AVF.   The patient is scheduled for: R arm fistulogram, possible intervention I discussed with the patient the nature of angiographic procedures, especially the limited patencies of any endovascular intervention.  The patient is aware of that the risks of an angiographic procedure include but are not limited to: bleeding, infection, access site complications, renal failure, embolization, rupture of vessel, dissection, possible need for emergent surgical intervention, possible need  for surgical procedures to treat the patient's pathology, and stroke and death.    The patient is aware of the risks and agrees to proceed.  Leonides Sake, MD [facility] [place] Office: [phone] Pager: [phone]  05/13/2012, 7:39 AM

## 2012-05-16 ENCOUNTER — Other Ambulatory Visit: Payer: Medicare Other | Admitting: Lab

## 2012-05-16 ENCOUNTER — Ambulatory Visit: Payer: Medicare Other

## 2012-06-05 ENCOUNTER — Other Ambulatory Visit: Payer: Self-pay

## 2012-06-05 DIAGNOSIS — T82590A Other mechanical complication of surgically created arteriovenous fistula, initial encounter: Secondary | ICD-10-CM

## 2012-06-13 ENCOUNTER — Other Ambulatory Visit: Payer: Medicare Other | Admitting: Lab

## 2012-06-13 ENCOUNTER — Encounter: Payer: Self-pay | Admitting: Vascular Surgery

## 2012-06-13 ENCOUNTER — Ambulatory Visit: Payer: Medicare Other

## 2012-06-14 ENCOUNTER — Encounter: Payer: Self-pay | Admitting: Vascular Surgery

## 2012-06-14 ENCOUNTER — Encounter (INDEPENDENT_AMBULATORY_CARE_PROVIDER_SITE_OTHER): Payer: Medicare Other | Admitting: *Deleted

## 2012-06-14 ENCOUNTER — Encounter: Payer: Self-pay | Admitting: *Deleted

## 2012-06-14 ENCOUNTER — Other Ambulatory Visit: Payer: Self-pay | Admitting: *Deleted

## 2012-06-14 ENCOUNTER — Ambulatory Visit (INDEPENDENT_AMBULATORY_CARE_PROVIDER_SITE_OTHER): Payer: Medicare Other | Admitting: Vascular Surgery

## 2012-06-14 VITALS — BP 181/66 | HR 82 | Ht 60.0 in | Wt 125.0 lb

## 2012-06-14 DIAGNOSIS — Z48812 Encounter for surgical aftercare following surgery on the circulatory system: Secondary | ICD-10-CM

## 2012-06-14 DIAGNOSIS — T82898A Other specified complication of vascular prosthetic devices, implants and grafts, initial encounter: Secondary | ICD-10-CM

## 2012-06-14 DIAGNOSIS — N186 End stage renal disease: Secondary | ICD-10-CM

## 2012-06-14 DIAGNOSIS — T82590A Other mechanical complication of surgically created arteriovenous fistula, initial encounter: Secondary | ICD-10-CM

## 2012-06-14 NOTE — Progress Notes (Signed)
[facility] [place]  Established Dialysis Access  History of Present Illness  Miranda [name] is a 78 y.o. (09/16/1921) female who presents for re-evaluation of R brachiocephalic arteriovenous fistula.  By report they have cannulated the fistula successfully once but infilitrated it since then.  She denis any steal sx.  She is able to complete her ADL.  Past Medical History  Diagnosis Date  . Chronic kidney disease   . Hypertension   . Heart murmur     Dr Osbourne follows. Not followed by a Cardiologist  . Cancer     Breast- Lumpectomy.  RadiationTx  . Anemia   . Gout     Past Surgical History  Procedure Date  . Parathyroidectomy   . Knee surgery     Right arthroscopy  . Breast lumpectomy     left  . Eye surgery     Cataract eyes  . Appendectomy   . Av fistula placement 02/28/2012    Procedure: ARTERIOVENOUS (AV) FISTULA CREATION;  Surgeon: Brian L Chen, MD;  Location: [facility] OR;  Service: Vascular;  Laterality: Right;  Ultrasound guided.  . Insertion of dialysis catheter 03/12/2012    Procedure: INSERTION OF DIALYSIS CATHETER;  Surgeon: Christopher S Dickson, MD;  Location: [facility] OR;  Service: Vascular;  Laterality: N/A;  Right Internal Jugular Placement    History   Social History  . Marital Status: Married    Spouse Name: N/A    Number of Children: N/A  . Years of Education: N/A   Occupational History  . Not on file.   Social History Main Topics  . Smoking status: Never Smoker   . Smokeless tobacco: Never Used  . Alcohol Use: No  . Drug Use: No  . Sexually Active: Not on file   Other Topics Concern  . Not on file   Social History Narrative  . No narrative on file    Family History  Problem Relation Age of Onset  . Hypertension Mother   . Stroke Mother     Current Outpatient Prescriptions on File Prior to Visit  Medication Sig Dispense Refill  . allopurinol (ZYLOPRIM) 100 MG tablet Take 100 mg by mouth 2 (two) times daily.      . aspirin  EC 81 MG tablet Take 81 mg by mouth daily.      . atorvastatin (LIPITOR) 40 MG tablet Take 40 mg by mouth daily.       . calcium carbonate (OS-CAL) 600 MG TABS Take 600 mg by mouth 2 (two) times daily with a meal.       . HYDROcodone-acetaminophen (VICODIN) 5-500 MG per tablet Take 0.5 tablets by mouth every 6 (six) hours as needed. For pain      . Multiple Vitamins-Minerals (CERTAVITE SENIOR/ANTIOXIDANT PO) Take by mouth daily.      . pantoprazole (PROTONIX) 40 MG tablet Take 40 mg by mouth daily.       . predniSONE (DELTASONE) 5 MG tablet Take 5 mg by mouth daily.      . sertraline (ZOLOFT) 50 MG tablet Take 50 mg by mouth 2 (two) times daily.         Allergies  Allergen Reactions  . Sulfa Antibiotics Other (See Comments)    Unknown.  "Long time ago"   REVIEW OF SYSTEMS: (Positives indicated with an "x", otherwise negative)  CARDIOVASCULAR: [ ] chest pain [ ] chest pressure [ ] palpitations [ ] orthopnea [ ] dyspnea on exert. [ ] claudication [ ]   rest pain [ ] DVT [ ] phlebitis  PULMONARY: [ ] productive cough [ ] asthma [ ] wheezing  NEUROLOGIC: [ ] weakness [ ] paresthesias [ ] aphasia [ ] amaurosis [ ] dizziness  HEMATOLOGIC: [ ] bleeding problems [ ] clotting disorders  MUSCULOSKEL: [ ] joint pain [ ] joint swelling  GASTROINTEST: [ ] blood in stool [ ] hematemesis  GENITOURINARY: [ ] dysuria [ ] hematuria  PSYCHIATRIC: [ ] history of major depression  INTEGUMENTARY: [ ] rashes [ ] ulcers  CONSTITUTIONAL: [ ] fever [ ] chills  Physical Examination  Filed Vitals:   06/14/12 1213  BP: 181/66  Pulse: 82  Height: 5' (1.524 m)  Weight: 125 lb (56.7 kg)  SpO2: 92%   Body mass index is 24.41 kg/(m^2).  General: A&O x 3, WDWN  Pulmonary: Sym exp, good air movt, CTAB, no rales, rhonchi, & wheezing  Cardiac: RRR, Nl S1, S2, no Murmurs, rubs or gallops  Gastrointestinal: soft, NTND, -G/R, - HSM, - masses, - CVAT B  Musculoskeletal: M/S 5/5 throughout , Extremities without   ischemic changes , proximally and distally palpable thrill, mid-segment is difficult to palpate  Neurologic: CN 2-12 intact , Pain and light touch intact in extremities , Motor exam as listed above  Non-Invasive Vascular Imaging  R Access duplex (Date: 06/14/12):   Proximal and distal vein > 6 mm  Intervening segment is 3-4 mm   Medical Decision Making  [name] is a 78 y.o. female who presents with ESRD requiring hemodialysis.    Given the difficulties with cannulation, I suggest a Balloon assisted maturation procedure, i.e. venoplasty of the cephalic vein for its entire length to 6 mm, and also ligating the side branches if no rupture occurs.  I discussed with the patient the nature of angiographic procedures, especially the limited patencies of any endovascular intervention.  The patient is aware of that the risks of an angiographic procedure include but are not limited to: bleeding, infection, access site complications, renal failure, embolization, rupture of vessel, dissection, possible need for emergent surgical intervention, possible need for surgical procedures to treat the patient's pathology, and stroke and death.    The patient is aware of the risks and agrees to proceed.  The procedure is scheduled for this Wednesday.  Brian Chen, MD [facility] [place] Office: [phone] Pager: [phone]  06/14/2012, 1:03 PM   

## 2012-06-17 ENCOUNTER — Encounter (HOSPITAL_COMMUNITY): Payer: Self-pay | Admitting: Pharmacy Technician

## 2012-06-18 ENCOUNTER — Encounter (HOSPITAL_COMMUNITY): Payer: Self-pay | Admitting: Emergency Medicine

## 2012-06-18 ENCOUNTER — Emergency Department (HOSPITAL_COMMUNITY)
Admission: EM | Admit: 2012-06-18 | Discharge: 2012-06-19 | Disposition: A | Discharge status: Home / Self Care | Payer: Medicare Other | Attending: Emergency Medicine | Admitting: Emergency Medicine

## 2012-06-18 DIAGNOSIS — Z992 Dependence on renal dialysis: Secondary | ICD-10-CM | POA: Insufficient documentation

## 2012-06-18 DIAGNOSIS — M7989 Other specified soft tissue disorders: Secondary | ICD-10-CM

## 2012-06-18 DIAGNOSIS — Z79899 Other long term (current) drug therapy: Secondary | ICD-10-CM | POA: Insufficient documentation

## 2012-06-18 DIAGNOSIS — N189 Chronic kidney disease, unspecified: Secondary | ICD-10-CM | POA: Insufficient documentation

## 2012-06-18 DIAGNOSIS — Z853 Personal history of malignant neoplasm of breast: Secondary | ICD-10-CM | POA: Insufficient documentation

## 2012-06-18 DIAGNOSIS — M109 Gout, unspecified: Secondary | ICD-10-CM | POA: Insufficient documentation

## 2012-06-18 DIAGNOSIS — Z7982 Long term (current) use of aspirin: Secondary | ICD-10-CM | POA: Insufficient documentation

## 2012-06-18 DIAGNOSIS — I129 Hypertensive chronic kidney disease with stage 1 through stage 4 chronic kidney disease, or unspecified chronic kidney disease: Secondary | ICD-10-CM | POA: Insufficient documentation

## 2012-06-18 DIAGNOSIS — Z8673 Personal history of transient ischemic attack (TIA), and cerebral infarction without residual deficits: Secondary | ICD-10-CM | POA: Insufficient documentation

## 2012-06-18 DIAGNOSIS — M79609 Pain in unspecified limb: Secondary | ICD-10-CM | POA: Insufficient documentation

## 2012-06-18 MED ORDER — SODIUM CHLORIDE 0.9 % IV SOLN: INTRAVENOUS | Status: DC

## 2012-06-18 MED ORDER — DEXTROSE 5 % IV SOLN
1.5000 g | INTRAVENOUS | Status: DC
  Filled 2012-06-18: qty 1.5

## 2012-06-18 NOTE — Progress Notes (Signed)
Pt currently in ER with swollen hand, ER staff does not yet know if she will be admitted. Left message on pt's home phone with arrival time, NPO instructions, medications to take in AM.

## 2012-06-18 NOTE — ED Notes (Signed)
During initial assessment, pt had what seemed to be a syncopal episode for approximately 15 seconds, and would not respond to commands. Pt displayed jerking movements of upper extremities. When pt regained consciousness she could not remember what happened. Pt family deny a hx of seizures.

## 2012-06-18 NOTE — ED Notes (Signed)
Pt stated that she was at dialysis and they saw her left hand was swollen and painful. Pt stated that she noticed the swelling yesterday. There is starting to be some swelling to right middle finger as well. Pt c/o pain to left hand and unable to make a fist.

## 2012-06-18 NOTE — ED Provider Notes (Addendum)
History     CSN: [id number]  Arrival date & time 06/18/12  1753   First MD Initiated Contact with Patient 06/18/12 2333      Chief Complaint  Patient presents with  . Edema  . Hand Pain    (Consider location/radiation/quality/duration/timing/severity/associated sxs/prior treatment) The history is provided by the patient, the spouse and a relative.   78 year old, female, who gets dialysis Tu, Th, Sat.  Presents emergency department complaining of left hand swelling.  For 2 days.  She denies a history of trauma.  She denies fevers, or chills.  She denies pain anywhere.  She was at dialysis today, and they were concerned, so they sent her here for evaluation.  She has never had similar symptoms in the past.  Past Medical History  Diagnosis Date  . Chronic kidney disease   . Hypertension   . Heart murmur     Dr Particia Lather follows. Not followed by a Cardiologist  . Anemia   . Gout   . Cancer     Breast- Lumpectomy.  RadiationTx    Past Surgical History  Procedure Date  . Parathyroidectomy   . Breast lumpectomy     left  . Eye surgery     Cataract eyes  . Appendectomy   . Av fistula placement 02/28/2012    Procedure: ARTERIOVENOUS (AV) FISTULA CREATION;  Surgeon: Fransisco Hertz, MD;  Location: [facility] OR;  Service: Vascular;  Laterality: Right;  Ultrasound guided.  . Insertion of dialysis catheter 03/12/2012    Procedure: INSERTION OF DIALYSIS CATHETER;  Surgeon: Chuck Hint, MD;  Location: [facility] OR;  Service: Vascular;  Laterality: N/A;  Right Internal Jugular Placement    Family History  Problem Relation Age of Onset  . Hypertension Mother   . Stroke Mother     History  Substance Use Topics  . Smoking status: Never Smoker   . Smokeless tobacco: Never Used  . Alcohol Use: No    OB History    Grav Para Term Preterm Abortions TAB SAB Ect Mult Living                  Review of Systems  Constitutional: Negative for fever and chills.  Cardiovascular: Negative for  chest pain.  Gastrointestinal: Negative for nausea and vomiting.  Musculoskeletal:       Right hand swelling  Skin: Positive for color change. Negative for rash.  Neurological: Negative for weakness.  All other systems reviewed and are negative.    Allergies  Ace inhibitors and Sulfa antibiotics  Home Medications   Current Outpatient Rx  Name Route Sig Dispense Refill  . ALLOPURINOL 100 MG PO TABS Oral Take 100 mg by mouth 2 (two) times daily.    . ASPIRIN EC 81 MG PO TBEC Oral Take 81 mg by mouth daily.    . ATORVASTATIN CALCIUM 40 MG PO TABS Oral Take 40 mg by mouth daily.     Marland Kitchen CALCIUM CARBONATE 600 MG PO TABS Oral Take 600 mg by mouth 2 (two) times daily with a meal.     . HYDROCODONE-ACETAMINOPHEN 5-500 MG PO TABS Oral Take 0.5 tablets by mouth every 6 (six) hours as needed. For pain    . CERTAVITE SENIOR/ANTIOXIDANT PO Oral Take by mouth daily.    Marland Kitchen PANTOPRAZOLE SODIUM 40 MG PO TBEC Oral Take 40 mg by mouth daily.     Marland Kitchen PREDNISONE 5 MG PO TABS Oral Take 5 mg by mouth daily.    . SERTRALINE  HCL 50 MG PO TABS Oral Take 50 mg by mouth 2 (two) times daily.       BP 154/58  Pulse 91  Temp 98.2 F (36.8 C) (Oral)  Resp 18  SpO2 94%  Physical Exam  Nursing note and vitals reviewed. Constitutional: She is oriented to person, place, and time. She appears well-developed and well-nourished. No distress.  HENT:  Head: Normocephalic and atraumatic.  Eyes: Conjunctivae normal are normal.  Neck: Normal range of motion. Neck supple. No JVD present.       + bruits vs radiation of cardiac murmur  Cardiovascular: Normal rate and regular rhythm.   Murmur heard.      Left radial pulse +  Can't palpate right radial pulse + thrill over right av vistula  Pulmonary/Chest: Effort normal and breath sounds normal. No respiratory distress.  Abdominal: She exhibits no distension.  Musculoskeletal: She exhibits edema.  Neurological: She is alert and oriented to person, place, and time. No  cranial nerve deficit.  Skin: Skin is dry. No erythema. There is pallor.       left hand 2 + edema dorsally Non tender + pale digits.     ED Course  Procedures (including critical care time)painless left hand swelling with pale digits No sxs to suggest infx. No hx of trauma nontender  Labs Reviewed - No data to display No results found.   No diagnosis found.  Pt is on steroids and past labs indicted leukocytosis.  She does not have a fever. I doubt she has an infx as cause of hand swelling.  Furthermore, pale fingers would be an unusual presentation for infx. Edema is localized to left hand.  ddimer is elevated but pt gets dialysis.  I believe the elevation is due to chronic inflammatory state.   Nevertheless will get outpt [place] to look for dvt.   i suspect pt has raynaud's dz.    MDM  Left hand edema with pale digits Suspect raynaud's dz infx and dvt less likely.          Cheri Guppy, MD 06/18/12 2358  Cheri Guppy, MD 06/19/12 0110  Cheri Guppy, MD 06/19/12 [phone]

## 2012-06-19 ENCOUNTER — Telehealth (HOSPITAL_COMMUNITY): Payer: Self-pay | Admitting: *Deleted

## 2012-06-19 ENCOUNTER — Other Ambulatory Visit: Payer: Self-pay | Admitting: *Deleted

## 2012-06-19 ENCOUNTER — Encounter (HOSPITAL_COMMUNITY)
Admission: EM | Disposition: A | Discharge status: Home / Self Care | Payer: Self-pay | Source: Home / Self Care | Attending: Emergency Medicine

## 2012-06-19 ENCOUNTER — Ambulatory Visit (HOSPITAL_COMMUNITY): Admission: RE | Admit: 2012-06-19 | Payer: Medicare Other | Source: Ambulatory Visit | Admitting: Vascular Surgery

## 2012-06-19 ENCOUNTER — Ambulatory Visit (HOSPITAL_COMMUNITY)
Admission: RE | Admit: 2012-06-19 | Discharge: 2012-06-19 | Disposition: A | Discharge status: Home / Self Care | Payer: Medicare Other | Source: Ambulatory Visit | Attending: Emergency Medicine | Admitting: Emergency Medicine

## 2012-06-19 DIAGNOSIS — M7989 Other specified soft tissue disorders: Secondary | ICD-10-CM

## 2012-06-19 LAB — CBC WITH DIFFERENTIAL/PLATELET
Basophils Absolute: 0 10*3/uL (ref 0.0–0.1)
Basophils Relative: 0 % (ref 0–1)
HCT: 36.2 % (ref 36.0–46.0)
Lymphocytes Relative: 5 % — ABNORMAL LOW (ref 12–46)
MCHC: 31.8 g/dL (ref 30.0–36.0)
Monocytes Absolute: 1.7 10*3/uL — ABNORMAL HIGH (ref 0.1–1.0)
Neutro Abs: 14.8 10*3/uL — ABNORMAL HIGH (ref 1.7–7.7)
Platelets: 334 10*3/uL (ref 150–400)
RDW: 20.8 % — ABNORMAL HIGH (ref 11.5–15.5)
WBC: 17.4 10*3/uL — ABNORMAL HIGH (ref 4.0–10.5)

## 2012-06-19 LAB — BASIC METABOLIC PANEL
BUN: 13 mg/dL (ref 6–23)
Calcium: 8.8 mg/dL (ref 8.4–10.5)
Chloride: 92 mEq/L — ABNORMAL LOW (ref 96–112)
Creatinine, Ser: 1.86 mg/dL — ABNORMAL HIGH (ref 0.50–1.10)
GFR calc Af Amer: 27 mL/min — ABNORMAL LOW (ref >90)
GFR calc non Af Amer: 23 mL/min — ABNORMAL LOW (ref >90)

## 2012-06-19 LAB — D-DIMER, QUANTITATIVE: D-Dimer, Quant: 2.86 ug/mL-FEU — ABNORMAL HIGH (ref 0.00–0.48)

## 2012-06-19 SURGERY — LIGATION OF COMPETING BRANCHES OF ARTERIOVENOUS FISTULA
Anesthesia: Monitor Anesthesia Care | Site: Arm Lower | Laterality: Right

## 2012-06-19 MED ORDER — ENOXAPARIN SODIUM 60 MG/0.6ML ~~LOC~~ SOLN
50.0000 mg | Freq: Once | SUBCUTANEOUS | Status: AC
  Administered 2012-06-19: 50 mg via SUBCUTANEOUS
  Filled 2012-06-19: qty 0.6

## 2012-06-19 NOTE — Progress Notes (Signed)
A GENTLEMAN CALLED AND STATED MRS Lemen WILL NOT BE COMING TODAY FOR HER SURGERY.  SHE WAS A PT IN THE ED LAST PM.  PAM IN THE OR WAS ADVISED . SHE WILL CALL DR. CHEN .

## 2012-06-19 NOTE — Progress Notes (Signed)
*  PRELIMINARY RESULTS* Vascular Ultrasound Left upper extremity venous duplex has been completed.  Preliminary findings: Left:  No evidence of DVT or superficial thrombosis.     Farrel Demark, RDMS, RVT 06/19/2012, 9:10 AM

## 2012-06-19 NOTE — H&P (View-Only) (Signed)
[facility] [place]  Established Dialysis Access  History of Present Illness  Miranda [name] is a 78 y.o. (09-Dec-1921) female who presents for re-evaluation of R brachiocephalic arteriovenous fistula.  By report they have cannulated the fistula successfully once but infilitrated it since then.  She denis any steal sx.  She is able to complete her ADL.  Past Medical History  Diagnosis Date  . Chronic kidney disease   . Hypertension   . Heart murmur     Dr Particia Lather follows. Not followed by a Cardiologist  . Cancer     Breast- Lumpectomy.  RadiationTx  . Anemia   . Gout     Past Surgical History  Procedure Date  . Parathyroidectomy   . Knee surgery     Right arthroscopy  . Breast lumpectomy     left  . Eye surgery     Cataract eyes  . Appendectomy   . Av fistula placement 02/28/2012    Procedure: ARTERIOVENOUS (AV) FISTULA CREATION;  Surgeon: Fransisco Hertz, MD;  Location: [facility] OR;  Service: Vascular;  Laterality: Right;  Ultrasound guided.  . Insertion of dialysis catheter 03/12/2012    Procedure: INSERTION OF DIALYSIS CATHETER;  Surgeon: Chuck Hint, MD;  Location: [facility] OR;  Service: Vascular;  Laterality: N/A;  Right Internal Jugular Placement    History   Social History  . Marital Status: Married    Spouse Name: N/A    Number of Children: N/A  . Years of Education: N/A   Occupational History  . Not on file.   Social History Main Topics  . Smoking status: Never Smoker   . Smokeless tobacco: Never Used  . Alcohol Use: No  . Drug Use: No  . Sexually Active: Not on file   Other Topics Concern  . Not on file   Social History Narrative  . No narrative on file    Family History  Problem Relation Age of Onset  . Hypertension Mother   . Stroke Mother     Current Outpatient Prescriptions on File Prior to Visit  Medication Sig Dispense Refill  . allopurinol (ZYLOPRIM) 100 MG tablet Take 100 mg by mouth 2 (two) times daily.      Marland Kitchen aspirin  EC 81 MG tablet Take 81 mg by mouth daily.      Marland Kitchen atorvastatin (LIPITOR) 40 MG tablet Take 40 mg by mouth daily.       . calcium carbonate (OS-CAL) 600 MG TABS Take 600 mg by mouth 2 (two) times daily with a meal.       . HYDROcodone-acetaminophen (VICODIN) 5-500 MG per tablet Take 0.5 tablets by mouth every 6 (six) hours as needed. For pain      . Multiple Vitamins-Minerals (CERTAVITE SENIOR/ANTIOXIDANT PO) Take by mouth daily.      . pantoprazole (PROTONIX) 40 MG tablet Take 40 mg by mouth daily.       . predniSONE (DELTASONE) 5 MG tablet Take 5 mg by mouth daily.      . sertraline (ZOLOFT) 50 MG tablet Take 50 mg by mouth 2 (two) times daily.         Allergies  Allergen Reactions  . Sulfa Antibiotics Other (See Comments)    Unknown.  "Long time ago"   REVIEW OF SYSTEMS: (Positives indicated with an "x", otherwise negative)  CARDIOVASCULAR: [ ]  chest pain [ ]  chest pressure [ ]  palpitations [ ]  orthopnea [ ]  dyspnea on exert. [ ]  claudication [ ]   rest pain [ ]  DVT [ ]  phlebitis  PULMONARY: [ ]  productive cough [ ]  asthma [ ]  wheezing  NEUROLOGIC: [ ]  weakness [ ]  paresthesias [ ]  aphasia [ ]  amaurosis [ ]  dizziness  HEMATOLOGIC: [ ]  bleeding problems [ ]  clotting disorders  MUSCULOSKEL: [ ]  joint pain [ ]  joint swelling  GASTROINTEST: [ ]  blood in stool [ ]  hematemesis  GENITOURINARY: [ ]  dysuria [ ]  hematuria  PSYCHIATRIC: [ ]  history of major depression  INTEGUMENTARY: [ ]  rashes [ ]  ulcers  CONSTITUTIONAL: [ ]  fever [ ]  chills  Physical Examination  Filed Vitals:   06/14/12 1213  BP: 181/66  Pulse: 82  Height: 5' (1.524 m)  Weight: 125 lb (56.7 kg)  SpO2: 92%   Body mass index is 24.41 kg/(m^2).  General: A&O x 3, WDWN  Pulmonary: Sym exp, good air movt, CTAB, no rales, rhonchi, & wheezing  Cardiac: RRR, Nl S1, S2, no Murmurs, rubs or gallops  Gastrointestinal: soft, NTND, -G/R, - HSM, - masses, - CVAT B  Musculoskeletal: M/S 5/5 throughout , Extremities without   ischemic changes , proximally and distally palpable thrill, mid-segment is difficult to palpate  Neurologic: CN 2-12 intact , Pain and light touch intact in extremities , Motor exam as listed above  Non-Invasive Vascular Imaging  R Access duplex (Date: 06/14/12):   Proximal and distal vein > 6 mm  Intervening segment is 3-4 mm   Medical Decision Making  Miranda [name] is a 78 y.o. female who presents with ESRD requiring hemodialysis.    Given the difficulties with cannulation, I suggest a Balloon assisted maturation procedure, i.e. venoplasty of the cephalic vein for its entire length to 6 mm, and also ligating the side branches if no rupture occurs.  I discussed with the patient the nature of angiographic procedures, especially the limited patencies of any endovascular intervention.  The patient is aware of that the risks of an angiographic procedure include but are not limited to: bleeding, infection, access site complications, renal failure, embolization, rupture of vessel, dissection, possible need for emergent surgical intervention, possible need for surgical procedures to treat the patient's pathology, and stroke and death.    The patient is aware of the risks and agrees to proceed.  The procedure is scheduled for this Wednesday.  Leonides Sake, MD [facility] [place] Office: [phone] Pager: [phone]  06/14/2012, 1:03 PM

## 2012-06-19 NOTE — Interval H&P Note (Signed)
Procedure is canceled per patient's wishes due to left hand symptoms patient is having evaluated in the ER.  Leonides Sake, MD [facility] [place] Office: [phone] Pager: [phone]  06/19/2012, 8:20 AM

## 2012-06-19 NOTE — Interval H&P Note (Signed)
[facility] [place]  History and Physical Update  The patient was interviewed and re-examined.  The patient's previous History and Physical has been reviewed and is unchanged.  There is no change in the plan of care: right arm fistulogram, venoplasty, and possible side branch ligation.  Leonides Sake, MD [facility] [place] Office: [phone] Pager: [phone]  06/19/2012, 7:25 AM

## 2012-06-24 ENCOUNTER — Other Ambulatory Visit: Payer: Self-pay

## 2012-06-24 ENCOUNTER — Emergency Department (HOSPITAL_COMMUNITY): Payer: Medicare Other

## 2012-06-24 ENCOUNTER — Inpatient Hospital Stay (HOSPITAL_COMMUNITY)
Admission: EM | Admit: 2012-06-24 | Discharge: 2012-07-04 | DRG: 193 | Disposition: A | Discharge status: Skilled Nursing Facility | Payer: Medicare Other | Attending: Internal Medicine | Admitting: Internal Medicine

## 2012-06-24 ENCOUNTER — Encounter (HOSPITAL_COMMUNITY): Payer: Self-pay | Admitting: *Deleted

## 2012-06-24 DIAGNOSIS — I359 Nonrheumatic aortic valve disorder, unspecified: Secondary | ICD-10-CM | POA: Diagnosis present

## 2012-06-24 DIAGNOSIS — R0902 Hypoxemia: Secondary | ICD-10-CM | POA: Diagnosis present

## 2012-06-24 DIAGNOSIS — Z79899 Other long term (current) drug therapy: Secondary | ICD-10-CM

## 2012-06-24 DIAGNOSIS — D631 Anemia in chronic kidney disease: Secondary | ICD-10-CM | POA: Diagnosis present

## 2012-06-24 DIAGNOSIS — Z85118 Personal history of other malignant neoplasm of bronchus and lung: Secondary | ICD-10-CM

## 2012-06-24 DIAGNOSIS — M109 Gout, unspecified: Secondary | ICD-10-CM | POA: Diagnosis present

## 2012-06-24 DIAGNOSIS — I120 Hypertensive chronic kidney disease with stage 5 chronic kidney disease or end stage renal disease: Secondary | ICD-10-CM | POA: Diagnosis present

## 2012-06-24 DIAGNOSIS — Z992 Dependence on renal dialysis: Secondary | ICD-10-CM

## 2012-06-24 DIAGNOSIS — K219 Gastro-esophageal reflux disease without esophagitis: Secondary | ICD-10-CM | POA: Diagnosis present

## 2012-06-24 DIAGNOSIS — J90 Pleural effusion, not elsewhere classified: Secondary | ICD-10-CM

## 2012-06-24 DIAGNOSIS — E8779 Other fluid overload: Secondary | ICD-10-CM | POA: Diagnosis not present

## 2012-06-24 DIAGNOSIS — Z7982 Long term (current) use of aspirin: Secondary | ICD-10-CM

## 2012-06-24 DIAGNOSIS — J189 Pneumonia, unspecified organism: Principal | ICD-10-CM | POA: Diagnosis present

## 2012-06-24 DIAGNOSIS — IMO0002 Reserved for concepts with insufficient information to code with codable children: Secondary | ICD-10-CM

## 2012-06-24 DIAGNOSIS — Z853 Personal history of malignant neoplasm of breast: Secondary | ICD-10-CM

## 2012-06-24 DIAGNOSIS — N186 End stage renal disease: Secondary | ICD-10-CM | POA: Diagnosis present

## 2012-06-24 DIAGNOSIS — Z66 Do not resuscitate: Secondary | ICD-10-CM | POA: Diagnosis not present

## 2012-06-24 DIAGNOSIS — I350 Nonrheumatic aortic (valve) stenosis: Secondary | ICD-10-CM | POA: Diagnosis present

## 2012-06-24 DIAGNOSIS — N032 Chronic nephritic syndrome with diffuse membranous glomerulonephritis: Secondary | ICD-10-CM | POA: Diagnosis present

## 2012-06-24 DIAGNOSIS — D72829 Elevated white blood cell count, unspecified: Secondary | ICD-10-CM | POA: Diagnosis present

## 2012-06-24 DIAGNOSIS — N2581 Secondary hyperparathyroidism of renal origin: Secondary | ICD-10-CM | POA: Diagnosis present

## 2012-06-24 DIAGNOSIS — J811 Chronic pulmonary edema: Secondary | ICD-10-CM | POA: Diagnosis present

## 2012-06-24 DIAGNOSIS — N189 Chronic kidney disease, unspecified: Secondary | ICD-10-CM | POA: Diagnosis present

## 2012-06-24 HISTORY — DX: Gastro-esophageal reflux disease without esophagitis: K21.9

## 2012-06-24 LAB — CBC WITH DIFFERENTIAL/PLATELET
Basophils Absolute: 0 10*3/uL (ref 0.0–0.1)
Lymphocytes Relative: 4 % — ABNORMAL LOW (ref 12–46)
Neutro Abs: 14.3 10*3/uL — ABNORMAL HIGH (ref 1.7–7.7)
Platelets: 367 10*3/uL (ref 150–400)
RBC: 3.74 MIL/uL — ABNORMAL LOW (ref 3.87–5.11)
RDW: 20.5 % — ABNORMAL HIGH (ref 11.5–15.5)
WBC: 16.5 10*3/uL — ABNORMAL HIGH (ref 4.0–10.5)

## 2012-06-24 LAB — BASIC METABOLIC PANEL
Calcium: 9.1 mg/dL (ref 8.4–10.5)
Chloride: 94 mEq/L — ABNORMAL LOW (ref 96–112)
Creatinine, Ser: 2.92 mg/dL — ABNORMAL HIGH (ref 0.50–1.10)
GFR calc Af Amer: 15 mL/min — ABNORMAL LOW (ref >90)
Sodium: 133 mEq/L — ABNORMAL LOW (ref 135–145)

## 2012-06-24 MED ORDER — SODIUM CHLORIDE 0.9 % IV SOLN: INTRAVENOUS | Status: DC

## 2012-06-24 MED ORDER — DEXTROSE 5 % IV SOLN
1.0000 g | INTRAVENOUS | Status: DC
  Filled 2012-06-24: qty 1

## 2012-06-24 MED ORDER — ATORVASTATIN CALCIUM 40 MG PO TABS
40.0000 mg | ORAL_TABLET | Freq: Every day | ORAL | Status: DC
  Administered 2012-06-25 – 2012-07-04 (×10): 40 mg via ORAL
  Filled 2012-06-24 (×12): qty 1

## 2012-06-24 MED ORDER — SODIUM CHLORIDE 0.9 % IJ SOLN
3.0000 mL | Freq: Two times a day (BID) | INTRAMUSCULAR | Status: DC
  Administered 2012-06-29: 3 mL via INTRAVENOUS

## 2012-06-24 MED ORDER — SODIUM CHLORIDE 0.9 % IJ SOLN
3.0000 mL | Freq: Two times a day (BID) | INTRAMUSCULAR | Status: DC
  Administered 2012-06-26 – 2012-07-04 (×15): 3 mL via INTRAVENOUS

## 2012-06-24 MED ORDER — VANCOMYCIN HCL IN DEXTROSE 1-5 GM/200ML-% IV SOLN
1000.0000 mg | Freq: Once | INTRAVENOUS | Status: AC
  Administered 2012-06-24: 1000 mg via INTRAVENOUS
  Filled 2012-06-24: qty 200

## 2012-06-24 MED ORDER — SODIUM CHLORIDE 0.9 % IV SOLN
250.0000 mL | INTRAVENOUS | Status: DC | PRN
  Administered 2012-06-29 – 2012-06-30 (×2): 250 mL via INTRAVENOUS

## 2012-06-24 MED ORDER — ACETAMINOPHEN 500 MG PO TABS
500.0000 mg | ORAL_TABLET | Freq: Four times a day (QID) | ORAL | Status: DC | PRN
  Filled 2012-06-24: qty 1

## 2012-06-24 MED ORDER — CALCIUM CARBONATE 1250 (500 CA) MG PO TABS
1250.0000 mg | ORAL_TABLET | Freq: Two times a day (BID) | ORAL | Status: DC
  Administered 2012-06-25 – 2012-07-03 (×14): 1250 mg via ORAL
  Filled 2012-06-24 (×21): qty 1

## 2012-06-24 MED ORDER — VANCOMYCIN HCL 500 MG IV SOLR
500.0000 mg | INTRAVENOUS | Status: DC
  Administered 2012-06-25 – 2012-06-27 (×2): 500 mg via INTRAVENOUS
  Filled 2012-06-24 (×3): qty 500

## 2012-06-24 MED ORDER — DEXTROSE 5 % IV SOLN
1.0000 g | Freq: Once | INTRAVENOUS | Status: AC
  Administered 2012-06-25: 1 g via INTRAVENOUS
  Filled 2012-06-24: qty 1

## 2012-06-24 MED ORDER — DEXTROSE 5 % IV SOLN
1.0000 g | Freq: Three times a day (TID) | INTRAVENOUS | Status: DC
  Filled 2012-06-24 (×2): qty 1

## 2012-06-24 MED ORDER — ENOXAPARIN SODIUM 30 MG/0.3ML ~~LOC~~ SOLN
30.0000 mg | SUBCUTANEOUS | Status: DC
  Administered 2012-06-24 – 2012-07-03 (×10): 30 mg via SUBCUTANEOUS
  Filled 2012-06-24 (×11): qty 0.3

## 2012-06-24 MED ORDER — PREDNISONE 5 MG PO TABS
5.0000 mg | ORAL_TABLET | Freq: Every day | ORAL | Status: DC
  Administered 2012-06-25 – 2012-07-04 (×10): 5 mg via ORAL
  Filled 2012-06-24 (×12): qty 1

## 2012-06-24 MED ORDER — SODIUM CHLORIDE 0.9 % IJ SOLN: 3.0000 mL | INTRAMUSCULAR | Status: DC | PRN

## 2012-06-24 MED ORDER — BIOTENE DRY MOUTH MT LIQD
15.0000 mL | Freq: Two times a day (BID) | OROMUCOSAL | Status: DC
  Administered 2012-06-24 – 2012-07-03 (×13): 15 mL via OROMUCOSAL

## 2012-06-24 MED ORDER — SERTRALINE HCL 50 MG PO TABS
50.0000 mg | ORAL_TABLET | Freq: Every day | ORAL | Status: DC
  Administered 2012-06-25 – 2012-07-04 (×10): 50 mg via ORAL
  Filled 2012-06-24 (×12): qty 1

## 2012-06-24 MED ORDER — DEXTROSE 5 % IV SOLN
1.0000 g | INTRAVENOUS | Status: DC
  Filled 2012-06-24 (×2): qty 1

## 2012-06-24 MED ORDER — HYDROCODONE-ACETAMINOPHEN 5-325 MG PO TABS
0.5000 | ORAL_TABLET | Freq: Every evening | ORAL | Status: DC | PRN
  Administered 2012-06-24 – 2012-07-03 (×10): 0.5 via ORAL
  Filled 2012-06-24 (×11): qty 1

## 2012-06-24 MED ORDER — ASPIRIN EC 81 MG PO TBEC
81.0000 mg | DELAYED_RELEASE_TABLET | Freq: Every day | ORAL | Status: DC
  Administered 2012-06-25 – 2012-07-04 (×10): 81 mg via ORAL
  Filled 2012-06-24 (×11): qty 1

## 2012-06-24 MED ORDER — PIPERACILLIN-TAZOBACTAM 3.375 G IVPB 30 MIN
3.3750 g | Freq: Once | INTRAVENOUS | Status: AC
  Administered 2012-06-24: 3.375 g via INTRAVENOUS
  Filled 2012-06-24: qty 50

## 2012-06-24 MED ORDER — PANTOPRAZOLE SODIUM 40 MG PO TBEC
40.0000 mg | DELAYED_RELEASE_TABLET | Freq: Every day | ORAL | Status: DC
  Administered 2012-06-25 – 2012-07-04 (×10): 40 mg via ORAL
  Filled 2012-06-24 (×10): qty 1

## 2012-06-24 MED ORDER — ALLOPURINOL 100 MG PO TABS
100.0000 mg | ORAL_TABLET | Freq: Two times a day (BID) | ORAL | Status: DC
  Administered 2012-06-24 – 2012-07-04 (×20): 100 mg via ORAL
  Filled 2012-06-24 (×22): qty 1

## 2012-06-24 MED ORDER — LEVOFLOXACIN IN D5W 750 MG/150ML IV SOLN
750.0000 mg | Freq: Once | INTRAVENOUS | Status: AC
  Administered 2012-06-24: 750 mg via INTRAVENOUS
  Filled 2012-06-24 (×2): qty 150

## 2012-06-24 NOTE — ED Notes (Signed)
Consulting MD at bedside

## 2012-06-24 NOTE — ED Notes (Signed)
Per EMS pt. Has been newly dx renal failure, dialysis x6 mo. Past 2 days pt has dry cough. From assisted living. Nurse on scene states O2sats 81%/RA on back 90%/2L EMS 96%/4L Lungs clear. No N/V/D Temp 99.30F. 12 Lead clear

## 2012-06-24 NOTE — H&P (Signed)
[facility] History and Physical  JAQUESHA BOROFF VHQ:[id number] DOB: Dec 20, 1921 DOA: 06/24/2012  PCP: Darnelle Bos, MD    Chief Complaint:  Chief Complaint  Patient presents with  . Shortness of Breath     HPI: Miranda [name] is a 78 y.o. female with severe aortic stenosis and end-stage renal disease who was sent to the nursing home today from [facility] after the home health nurse found the temperature to be elevated and oxygen saturations to be 81% on room air. When I asked the patient about symptoms she denies any cough, sputum production, chills, fevers, chest pain. According to the son the patient has not been feeling well and she was referred to the emergency room on October 23 for left wrist pain. At that time she had blood cultures drawn which did not grow any bacteria. Apparently the patient is being having a chronic low-grade leukocytosis that has not been clarified the source of in the outpatient setting. Chest x-ray in the emergency room reveals a right lower lobe infiltrate and patient is admitted to the hospital with diagnosis of pneumonia.   Review of Systems:  The patient bruises very easily, has chronic joint pain which is felt to be due to chronic out, has generalized weakness  The patient denies anorexia, fever, weight loss,, vision loss, decreased hearing, hoarseness, chest pain, syncope, dyspnea on exertion, peripheral edema, balance deficits, hemoptysis, abdominal pain, melena, hematochezia, severe indigestion/heartburn, hematuria, incontinence, genital sores, transient blindness, difficulty walking, depression, unusual weight change,  Past Medical History  Diagnosis Date  . Chronic kidney disease   . Hypertension   . Heart murmur     Dr Particia Lather follows. Not followed by a Cardiologist  . Anemia   . Gout   . Cancer     Breast- Lumpectomy.  RadiationTx  . GERD (gastroesophageal reflux disease)    Past Surgical History  Procedure Date  .  Parathyroidectomy   . Breast lumpectomy     left  . Eye surgery     Cataract eyes  . Appendectomy   . Av fistula placement 02/28/2012    Procedure: ARTERIOVENOUS (AV) FISTULA CREATION;  Surgeon: Fransisco Hertz, MD;  Location: [facility] OR;  Service: Vascular;  Laterality: Right;  Ultrasound guided.  . Insertion of dialysis catheter 03/12/2012    Procedure: INSERTION OF DIALYSIS CATHETER;  Surgeon: Chuck Hint, MD;  Location: [facility] OR;  Service: Vascular;  Laterality: N/A;  Right Internal Jugular Placement   Social History:  reports that she has never smoked. She has never used smokeless tobacco. She reports that she does not drink alcohol or use illicit drugs. The patient lives with her husband at [facility]  Allergies  Allergen Reactions  . Ace Inhibitors     Pt is not sure where the allergy came from   . Sulfa Antibiotics Other (See Comments)    Unknown.  "Long time ago"    Family History  Problem Relation Age of Onset  . Hypertension Mother   . Stroke Mother     Prior to Admission medications   Medication Sig Start Date End Date Taking? Authorizing Provider  acetaminophen (TYLENOL) 500 MG tablet Take 500 mg by mouth every 6 (six) hours as needed. For pain   Yes Historical Provider, MD  allopurinol (ZYLOPRIM) 100 MG tablet Take 100 mg by mouth 2 (two) times daily.   Yes Historical Provider, MD  aspirin EC 81 MG tablet Take 81 mg by mouth daily.  Yes Historical Provider, MD  atorvastatin (LIPITOR) 40 MG tablet Take 40 mg by mouth daily.    Yes Historical Provider, MD  calcium carbonate (OS-CAL) 600 MG TABS Take 600 mg by mouth 2 (two) times daily with a meal.    Yes Historical Provider, MD  HYDROcodone-acetaminophen (VICODIN) 5-500 MG per tablet Take 0.5 tablets by mouth every 6 (six) hours as needed. For pain   Yes Historical Provider, MD  Multiple Vitamins-Minerals (CERTAVITE SENIOR/ANTIOXIDANT PO) Take by mouth daily.   Yes Historical Provider, MD    pantoprazole (PROTONIX) 40 MG tablet Take 40 mg by mouth daily.    Yes Historical Provider, MD  predniSONE (DELTASONE) 5 MG tablet Take 5 mg by mouth daily.   Yes Historical Provider, MD  sertraline (ZOLOFT) 50 MG tablet Take 50 mg by mouth daily.    Yes Historical Provider, MD   Physical Exam: Filed Vitals:   06/24/12 1607 06/24/12 1630 06/24/12 1710 06/24/12 1729  BP:  155/64  166/64  Pulse:  92 95   Temp:      TempSrc:      Resp:  18 27   SpO2: 93% 92% 93%      General:  Alert and oriented x3, head normocephalic/atraumatic  Eyes: Pupil equal round react to light and accommodation, extraocular movement is intact  ENT: No pharyngeal exudates  Neck: No palpable thyromegaly  Cardiovascular: Regular rate and rhythm with 4/6 systolic murmur at the right second intercostal space  Respiratory: No wheezes no rhonchi, even not labored, minimal crackles right base  Abdomen: Soft nontender, no palpable hepatosplenomegaly, bowel sounds present  Skin: Very fragile, multiple ecchymosis  Musculoskeletal: Changes of arthritis in both hands mostly affecting the proximal interphalangeal joints, minimal edema of the hands, +2 bilateral pretibial edema  Psychiatric: Euthymic  Neurologic: Cranial nerves 2-12 intact, strength 5 out of 5 in all 4 extremities, sensation intact  Labs on Admission:  Basic Metabolic Panel:  Lab 06/24/12 1610 06/18/12 2345  NA 133* 134*  K 3.4* 3.1*  CL 94* 92*  CO2 29 30  GLUCOSE 102* 101*  BUN 23 13  CREATININE 2.92* 1.86*  CALCIUM 9.1 8.8  MG -- --  PHOS -- --   Liver Function Tests: No results found for this basename: AST:5,ALT:5,ALKPHOS:5,BILITOT:5,PROT:5,ALBUMIN:5 in the last 168 hours No results found for this basename: LIPASE:5,AMYLASE:5 in the last 168 hours No results found for this basename: AMMONIA:5 in the last 168 hours CBC:  Lab 06/24/12 1534 06/18/12 2345  WBC 16.5* 17.4*  NEUTROABS 14.3* 14.8*  HGB 10.6* 11.5*  HCT 33.7* 36.2   MCV 90.1 88.9  PLT 367 334   Cardiac Enzymes: No results found for this basename: CKTOTAL:5,CKMB:5,CKMBINDEX:5,TROPONINI:5 in the last 168 hours  BNP (last 3 results) No results found for this basename: PROBNP:3 in the last 8760 hours CBG: No results found for this basename: GLUCAP:5 in the last 168 hours  Radiological Exams on Admission: Dg Chest Port 1 View  06/24/2012  *RADIOLOGY REPORT*  Clinical Data: Weakness, shortness of breath, cough.  PORTABLE CHEST - 1 VIEW  Comparison: 03/18/2012  Findings: Right dialysis catheter remains in place, unchanged. Bilateral lower lobe airspace opacities with small effusions. Heart is mildly enlarged.  No acute bony abnormality.  IMPRESSION: Bilateral lower lobe airspace opacities, atelectasis or consolidation.  Small effusions.   Original Report Authenticated By: Cyndie Chime, M.D.      Assessment/Plan Principal Problem:  *Pneumonia Active Problems:  Anemia associated with chronic renal failure  Pulmonary  edema  End stage renal disease  Hypoxemia  Gout  Severe aortic stenosis   1. Pneumonia-diagnoses based on leukocytosis, chest x-ray findings, fever, hypoxia. She does not have much symptoms in terms of dyspnea, cough, sputum production. We will send blood cultures, urine Legionella antigen, urine streptococcal antigen and start antibiotic for healthcare acquired pneumonia per protocol with vancomycin, cefepime and Levaquin. 2. End-stage renal disease with signs and symptoms of volume overload-I have called Dr. Arrie Aran on call for [facility] to alert him of the patient's admission 3. Severe aortic stenosis-patient denies chest pain, denies dyspnea on exertion. 4. Chronic gout-no flare. Continue allopurinol and prednisone  Code Status:  Full code  Family Communication: son Dr. Kathlene November Cieslewicz [phone] Disposition Plan: ? snf (anticipated LOS 3-4 days )  Time spent:  Jenney Brester [facility] Pager  [phone] If 7PM-7AM, please contact night-coverage [email or web address] Password [email or web address] 06/24/2012, 5:57 PM

## 2012-06-24 NOTE — ED Provider Notes (Cosign Needed)
History     CSN: [id number]  Arrival date & time 06/24/12  1512   First MD Initiated Contact with Patient 06/24/12 [phone]      Chief Complaint  Patient presents with  . Shortness of Breath    (Consider location/radiation/quality/duration/timing/severity/associated sxs/prior treatment) The history is provided by the patient, a relative and medical records.   78 year old, female, with chronic renal failure, who gets dialysis on Tuesday, Thursday, Saturday, presents to emergency department waning of shortness of breath, and fever.  She denies pain anywhere.  She denies cough.  She does not have any sweating, or chills.  Her temperature had been 100.6.  Her home health nurse noted that she was hypoxic, so.  She recommended.  The patient.  Come to the   ER for evaluation  Past Medical History  Diagnosis Date  . Chronic kidney disease   . Hypertension   . Heart murmur     Dr Particia Lather follows. Not followed by a Cardiologist  . Anemia   . Gout   . Cancer     Breast- Lumpectomy.  RadiationTx  . GERD (gastroesophageal reflux disease)     Past Surgical History  Procedure Date  . Parathyroidectomy   . Breast lumpectomy     left  . Eye surgery     Cataract eyes  . Appendectomy   . Av fistula placement 02/28/2012    Procedure: ARTERIOVENOUS (AV) FISTULA CREATION;  Surgeon: Fransisco Hertz, MD;  Location: [facility] OR;  Service: Vascular;  Laterality: Right;  Ultrasound guided.  . Insertion of dialysis catheter 03/12/2012    Procedure: INSERTION OF DIALYSIS CATHETER;  Surgeon: Chuck Hint, MD;  Location: [facility] OR;  Service: Vascular;  Laterality: N/A;  Right Internal Jugular Placement    Family History  Problem Relation Age of Onset  . Hypertension Mother   . Stroke Mother     History  Substance Use Topics  . Smoking status: Never Smoker   . Smokeless tobacco: Never Used  . Alcohol Use: No    OB History    Grav Para Term Preterm Abortions TAB SAB Ect Mult Living                    Review of Systems  Constitutional: Positive for fever. Negative for chills and diaphoresis.  Respiratory: Positive for shortness of breath. Negative for cough and chest tightness.   Cardiovascular: Negative for chest pain and leg swelling.  Gastrointestinal: Negative for nausea and vomiting.  Neurological: Negative for headaches.  Psychiatric/Behavioral: Negative for confusion.  All other systems reviewed and are negative.    Allergies  Ace inhibitors and Sulfa antibiotics  Home Medications   Current Outpatient Rx  Name Route Sig Dispense Refill  . ACETAMINOPHEN 500 MG PO TABS Oral Take 500 mg by mouth every 6 (six) hours as needed. For pain    . ALLOPURINOL 100 MG PO TABS Oral Take 100 mg by mouth 2 (two) times daily.    . ASPIRIN EC 81 MG PO TBEC Oral Take 81 mg by mouth daily.    . ATORVASTATIN CALCIUM 40 MG PO TABS Oral Take 40 mg by mouth daily.     Marland Kitchen CALCIUM CARBONATE 600 MG PO TABS Oral Take 600 mg by mouth 2 (two) times daily with a meal.     . HYDROCODONE-ACETAMINOPHEN 5-500 MG PO TABS Oral Take 0.5 tablets by mouth every 6 (six) hours as needed. For pain    . CERTAVITE SENIOR/ANTIOXIDANT PO Oral Take  by mouth daily.    Marland Kitchen PANTOPRAZOLE SODIUM 40 MG PO TBEC Oral Take 40 mg by mouth daily.     Marland Kitchen PREDNISONE 5 MG PO TABS Oral Take 5 mg by mouth daily.    . SERTRALINE HCL 50 MG PO TABS Oral Take 50 mg by mouth daily.       BP 160/62  Pulse 90  Temp 99 F (37.2 C) (Oral)  Resp 18  SpO2 93%  Physical Exam  Nursing note and vitals reviewed. Constitutional: She is oriented to person, place, and time. No distress.       Frail elderly, female, in no distress  HENT:  Head: Normocephalic and atraumatic.  Eyes: Conjunctivae normal and EOM are normal.  Neck: Normal range of motion. Neck supple. No JVD present.  Cardiovascular: Normal rate.   Murmur heard. Pulmonary/Chest: Effort normal. No respiratory distress. She has rales.       Bilateral lower lobe rales posteriorly   Abdominal: Soft. She exhibits no distension.  Musculoskeletal: Normal range of motion.  Neurological: She is alert and oriented to person, place, and time.  Skin: Skin is warm and dry.  Psychiatric: She has a normal mood and affect. Thought content normal.    ED Course  Procedures (including critical care time) 78 year old, female, with room air, hypoxia, fever.  Rales.  On, examination.  I suspect.  She has pneumonia.  We will do a chest x-ray, and laboratory testing, for evaluation.  She is not in respiratory distress.  At this time  Labs Reviewed  BASIC METABOLIC PANEL - Abnormal; Notable for the following:    Sodium 133 (*)     Potassium 3.4 (*)     Chloride 94 (*)     Glucose, Bld 102 (*)     Creatinine, Ser 2.92 (*)     GFR calc non Af Amer 13 (*)     GFR calc Af Amer 15 (*)     All other components within normal limits  CBC WITH DIFFERENTIAL - Abnormal; Notable for the following:    WBC 16.5 (*)     RBC 3.74 (*)     Hemoglobin 10.6 (*)     HCT 33.7 (*)     RDW 20.5 (*)     Neutrophils Relative 87 (*)     Neutro Abs 14.3 (*)     Lymphocytes Relative 4 (*)     Lymphs Abs 0.6 (*)     Monocytes Absolute 1.3 (*)     All other components within normal limits  CULTURE, BLOOD (ROUTINE X 2)  CULTURE, BLOOD (ROUTINE X 2)   Dg Chest Port 1 View  06/24/2012  *RADIOLOGY REPORT*  Clinical Data: Weakness, shortness of breath, cough.  PORTABLE CHEST - 1 VIEW  Comparison: 03/18/2012  Findings: Right dialysis catheter remains in place, unchanged. Bilateral lower lobe airspace opacities with small effusions. Heart is mildly enlarged.  No acute bony abnormality.  IMPRESSION: Bilateral lower lobe airspace opacities, atelectasis or consolidation.  Small effusions.   Original Report Authenticated By: Cyndie Chime, M.D.      No diagnosis found.  I spoke with the [facility].  He will admit the patient and the hospital for further evaluation and treatment  MDM  Healthcare  associated pneumonia, with leukocytosis, and room air, hypoxia        Cheri Guppy, MD 06/24/12 1639

## 2012-06-24 NOTE — Progress Notes (Addendum)
ANTIBIOTIC CONSULT NOTE - INITIAL  Pharmacy Consult for Vancomycin, Cefepime Indication: rule out pneumonia  Allergies  Allergen Reactions  . Ace Inhibitors     Pt is not sure where the allergy came from   . Sulfa Antibiotics Other (See Comments)    Unknown.  "Long time ago"    Patient Measurements: Height: 4' 11.84" (152 cm) Weight: 126 lb 1.7 oz (57.2 kg) IBW/kg (Calculated) : 45.14  Adjusted Body Weight:   Vital Signs: Temp: 98.6 F (37 C) (10/28 1800) Temp src: Oral (10/28 1800) BP: 135/78 mmHg (10/28 1800) Pulse Rate: 88  (10/28 1800) Intake/Output from previous day:   Intake/Output from this shift:    Labs:  Basename 06/24/12 1534  WBC 16.5*  HGB 10.6*  PLT 367  LABCREA --  CREATININE 2.92*   Estimated Creatinine Clearance: 10.5 ml/min (by C-G formula based on Cr of 2.92). No results found for this basename: VANCOTROUGH:2,VANCOPEAK:2,VANCORANDOM:2,GENTTROUGH:2,GENTPEAK:2,GENTRANDOM:2,TOBRATROUGH:2,TOBRAPEAK:2,TOBRARND:2,AMIKACINPEAK:2,AMIKACINTROU:2,AMIKACIN:2, in the last 72 hours   Microbiology: Recent Results (from the past 720 hour(s))  CULTURE, BLOOD (ROUTINE X 2)     Status: Normal (Preliminary result)   Collection Time   06/19/12  1:00 AM      Component Value Range Status Comment   Specimen Description BLOOD LEFT ARM   Final    Special Requests BOTTLES DRAWN AEROBIC ONLY 5CC   Final    Culture  Setup Time 06/19/2012 08:50   Final    Culture     Final    Value:        BLOOD CULTURE RECEIVED NO GROWTH TO DATE CULTURE WILL BE HELD FOR 5 DAYS BEFORE ISSUING A FINAL NEGATIVE REPORT   Report Status PENDING   Incomplete   CULTURE, BLOOD (ROUTINE X 2)     Status: Normal (Preliminary result)   Collection Time   06/19/12  1:05 AM      Component Value Range Status Comment   Specimen Description BLOOD LEFT HAND   Final    Special Requests BOTTLES DRAWN AEROBIC AND ANAEROBIC 5CC   Final    Culture  Setup Time 06/19/2012 08:50   Final    Culture     Final      Value:        BLOOD CULTURE RECEIVED NO GROWTH TO DATE CULTURE WILL BE HELD FOR 5 DAYS BEFORE ISSUING A FINAL NEGATIVE REPORT   Report Status PENDING   Incomplete     Medical History: Past Medical History  Diagnosis Date  . Chronic kidney disease   . Hypertension   . Heart murmur     Dr Particia Lather follows. Not followed by a Cardiologist  . Anemia   . Gout   . Cancer     Breast- Lumpectomy.  RadiationTx  . GERD (gastroesophageal reflux disease)     Medications:  Scheduled:    . allopurinol  100 mg Oral BID  . aspirin EC  81 mg Oral Daily  . atorvastatin  40 mg Oral Daily  . calcium carbonate  1,250 mg Oral BID WC  . ceFEPime (MAXIPIME) IV  1 g Intravenous Q24H  . enoxaparin (LOVENOX) injection  30 mg Subcutaneous Q24H  . levofloxacin (LEVAQUIN) IV  750 mg Intravenous Once  . pantoprazole  40 mg Oral Daily  . piperacillin-tazobactam  3.375 g Intravenous Once  . predniSONE  5 mg Oral Daily  . sertraline  50 mg Oral Daily  . sodium chloride  3 mL Intravenous Q12H  . sodium chloride  3 mL Intravenous Q12H  .  vancomycin  500 mg Intravenous Q T,Th,Sa-HD  . vancomycin  1,000 mg Intravenous Once  . DISCONTD: sodium chloride   Intravenous STAT  . DISCONTD: ceFEPime (MAXIPIME) IV  1 g Intravenous Q8H   Assessment: 78 yr old female was brought to the hospital from [facility] with SOB, fever and hypoxia. She is a dialysis pt who gets HD on Tue, Thurs, Sat. She was ordered vancomycin 1 Gm and cefepime 1 gm in the ED, in addition to receiving Zosyn 3.375 Gm in the ED and one dose of levaquin 750 mg. She is to be on vancomycin  for 8 days.  Goal of Therapy:  Vancomycin trough level 15-20 mcg/ml  Plan:  Vancomycin 1 gm in ED, then 500 mg after each HD on Tue, Thurs, Sat. Cefepime 1 gm in ED then !g after each HD on Tue, Thurs, Fri. Vanc levels when appropriate.  Eugene Garnet 06/24/2012,7:12 PM

## 2012-06-24 NOTE — Progress Notes (Signed)
Pt admitted to the unit. Pt is alert and oriented. Pt oriented to room, staff, and call bell. Bed in lowest position. Full assessment to Epic. Call bell with in reach. Told to call for assists. Will continue to monitor. Lynda Capistran, RN 

## 2012-06-24 NOTE — ED Notes (Signed)
ECG Normal sinus rhythm at 92 beats per minute. Left axis deviation. Normal intervals. Poor R wave progression  Cheri Guppy, MD 06/24/12 2031

## 2012-06-25 ENCOUNTER — Inpatient Hospital Stay (HOSPITAL_COMMUNITY): Payer: Medicare Other

## 2012-06-25 LAB — BASIC METABOLIC PANEL
Calcium: 8.8 mg/dL (ref 8.4–10.5)
Creatinine, Ser: 3.39 mg/dL — ABNORMAL HIGH (ref 0.50–1.10)
GFR calc non Af Amer: 11 mL/min — ABNORMAL LOW (ref >90)
Glucose, Bld: 65 mg/dL — ABNORMAL LOW (ref 70–99)
Sodium: 135 mEq/L (ref 135–145)

## 2012-06-25 LAB — CBC
MCH: 27.8 pg (ref 26.0–34.0)
Platelets: 332 10*3/uL (ref 150–400)
RBC: 3.56 MIL/uL — ABNORMAL LOW (ref 3.87–5.11)
RDW: 20.6 % — ABNORMAL HIGH (ref 11.5–15.5)

## 2012-06-25 LAB — CULTURE, BLOOD (ROUTINE X 2): Culture: NO GROWTH

## 2012-06-25 MED ORDER — DARBEPOETIN ALFA-POLYSORBATE 100 MCG/0.5ML IJ SOLN
INTRAMUSCULAR | Status: AC
  Administered 2012-06-25: 100 ug via INTRAVENOUS
  Filled 2012-06-25: qty 0.5

## 2012-06-25 MED ORDER — PARICALCITOL 5 MCG/ML IV SOLN
1.0000 ug | INTRAVENOUS | Status: DC
  Administered 2012-06-25 – 2012-07-04 (×4): 1 ug via INTRAVENOUS
  Filled 2012-06-25 (×5): qty 0.2

## 2012-06-25 MED ORDER — SORBITOL 70 % SOLN: 30.0000 mL | Status: DC | PRN

## 2012-06-25 MED ORDER — HEPARIN SODIUM (PORCINE) 1000 UNIT/ML DIALYSIS
20.0000 [IU]/kg | INTRAMUSCULAR | Status: DC | PRN
  Administered 2012-06-25: 1100 [IU] via INTRAVENOUS_CENTRAL
  Filled 2012-06-25: qty 2

## 2012-06-25 MED ORDER — HYDROXYZINE HCL 25 MG PO TABS: 25.0000 mg | ORAL_TABLET | Freq: Three times a day (TID) | ORAL | Status: DC | PRN

## 2012-06-25 MED ORDER — DOCUSATE SODIUM 283 MG RE ENEM: 1.0000 | ENEMA | RECTAL | Status: DC | PRN

## 2012-06-25 MED ORDER — ZOLPIDEM TARTRATE 5 MG PO TABS
5.0000 mg | ORAL_TABLET | Freq: Every evening | ORAL | Status: DC | PRN
  Filled 2012-06-25: qty 1

## 2012-06-25 MED ORDER — NEPRO/CARBSTEADY PO LIQD: 237.0000 mL | Freq: Three times a day (TID) | ORAL | Status: DC | PRN

## 2012-06-25 MED ORDER — CAMPHOR-MENTHOL 0.5-0.5 % EX LOTN
1.0000 "application " | TOPICAL_LOTION | Freq: Three times a day (TID) | CUTANEOUS | Status: DC | PRN
  Filled 2012-06-25: qty 222

## 2012-06-25 MED ORDER — CALCIUM CARBONATE 1250 MG/5ML PO SUSP: 500.0000 mg | Freq: Four times a day (QID) | ORAL | Status: DC | PRN

## 2012-06-25 MED ORDER — PARICALCITOL 5 MCG/ML IV SOLN
INTRAVENOUS | Status: AC
  Administered 2012-06-25: 1 ug via INTRAVENOUS
  Filled 2012-06-25: qty 1

## 2012-06-25 MED ORDER — DEXTROSE 5 % IV SOLN
1.5000 g | INTRAVENOUS | Status: DC
  Filled 2012-06-25: qty 1.5

## 2012-06-25 MED ORDER — DARBEPOETIN ALFA-POLYSORBATE 100 MCG/0.5ML IJ SOLN
100.0000 ug | INTRAMUSCULAR | Status: DC
  Administered 2012-06-25 – 2012-07-02 (×2): 100 ug via INTRAVENOUS
  Filled 2012-06-25 (×2): qty 0.5

## 2012-06-25 MED ORDER — ONDANSETRON HCL 4 MG PO TABS
4.0000 mg | ORAL_TABLET | Freq: Four times a day (QID) | ORAL | Status: DC | PRN
  Administered 2012-07-02: 4 mg via ORAL
  Filled 2012-06-25: qty 1

## 2012-06-25 MED ORDER — NEPRO/CARBSTEADY PO LIQD
237.0000 mL | Freq: Two times a day (BID) | ORAL | Status: DC
  Administered 2012-06-26 – 2012-07-04 (×12): 237 mL via ORAL

## 2012-06-25 MED ORDER — ACETAMINOPHEN 650 MG RE SUPP: 650.0000 mg | Freq: Four times a day (QID) | RECTAL | Status: DC | PRN

## 2012-06-25 MED ORDER — DEXTROSE 5 % IV SOLN
1.0000 g | INTRAVENOUS | Status: DC
  Administered 2012-06-25 – 2012-06-26 (×2): 1 g via INTRAVENOUS
  Filled 2012-06-25 (×3): qty 1

## 2012-06-25 MED ORDER — DEXTROSE 5 % IV SOLN
1.0000 g | INTRAVENOUS | Status: DC
  Filled 2012-06-25: qty 1

## 2012-06-25 MED ORDER — ACETAMINOPHEN 325 MG PO TABS
650.0000 mg | ORAL_TABLET | Freq: Four times a day (QID) | ORAL | Status: DC | PRN
  Administered 2012-06-27 – 2012-07-04 (×6): 650 mg via ORAL
  Filled 2012-06-25 (×3): qty 2

## 2012-06-25 MED ORDER — ONDANSETRON HCL 4 MG/2ML IJ SOLN: 4.0000 mg | Freq: Four times a day (QID) | INTRAMUSCULAR | Status: DC | PRN

## 2012-06-25 NOTE — Progress Notes (Signed)
CRITICAL VALUE ALERT  Critical value received:  Positive blood cultures, aerobic bottle, gram + cocci in clusters  Date of notification:  06/25/12  Time of notification:  1945  Critical value read back:yes  Nurse who received alert:  C.Zanna Hawn, RN  MD notified (1st page):  Dr. Debby Bud  Time of first page:  1947  MD notified (2nd page):  Time of second page:  Responding MD:  Dr. Debby Bud  Time MD responded:  [phone]

## 2012-06-25 NOTE — Progress Notes (Signed)
Subjective: Patient pleasant, alert and oriented x3, afebrile. Without complaint of shortness of breath or cough. Admission H&P reviewed,  Objective: Vital signs in last 24 hours: Temp:  [98.1 F (36.7 C)-99 F (37.2 C)] 98.6 F (37 C) (10/29 1000) Pulse Rate:  [80-95] 82  (10/29 1000) Resp:  [16-27] 18  (10/29 1000) BP: (135-168)/(62-78) 149/76 mmHg (10/29 1000) SpO2:  [90 %-97 %] 94 % (10/29 1000) Weight:  [57.2 kg (126 lb 1.7 oz)] 57.2 kg (126 lb 1.7 oz) (10/28 2037) Weight change:  Last BM Date: 06/24/12  Intake/Output from previous day: 10/28 0701 - 10/29 0700 In: 510 [P.O.:360; IV Piggyback:150] Out: -  Intake/Output this shift: Total I/O In: 120 [P.O.:120] Out: 150 [Urine:150]  General appearance: alert and cooperative Resp: decreased breath sound bibasilar no rales no rhonchi Cardio: regular rate and rhythm, S1, S2 normal, no murmur, click, rub or gallop GI: soft, non-tender; bowel sounds normal; no masses,  no organomegaly Extremities: extremities normal, atraumatic, no cyanosis or edema  Lab Results:  Results for orders placed during the hospital encounter of 06/24/12 (from the past 24 hour(s))  BASIC METABOLIC PANEL     Status: Abnormal   Collection Time   06/24/12  3:34 PM      Component Value Range   Sodium 133 (*) 135 - 145 mEq/L   Potassium 3.4 (*) 3.5 - 5.1 mEq/L   Chloride 94 (*) 96 - 112 mEq/L   CO2 29  19 - 32 mEq/L   Glucose, Bld 102 (*) 70 - 99 mg/dL   BUN 23  6 - 23 mg/dL   Creatinine, Ser 7.82 (*) 0.50 - 1.10 mg/dL   Calcium 9.1  8.4 - 95.6 mg/dL   GFR calc non Af Amer 13 (*) >90 mL/min   GFR calc Af Amer 15 (*) >90 mL/min  CBC WITH DIFFERENTIAL     Status: Abnormal   Collection Time   06/24/12  3:34 PM      Component Value Range   WBC 16.5 (*) 4.0 - 10.5 K/uL   RBC 3.74 (*) 3.87 - 5.11 MIL/uL   Hemoglobin 10.6 (*) 12.0 - 15.0 g/dL   HCT 21.3 (*) 08.6 - 57.8 %   MCV 90.1  78.0 - 100.0 fL   MCH 28.3  26.0 - 34.0 pg   MCHC 31.5  30.0 -  36.0 g/dL   RDW 46.9 (*) 62.9 - 52.8 %   Platelets 367  150 - 400 K/uL   Neutrophils Relative 87 (*) 43 - 77 %   Neutro Abs 14.3 (*) 1.7 - 7.7 K/uL   Lymphocytes Relative 4 (*) 12 - 46 %   Lymphs Abs 0.6 (*) 0.7 - 4.0 K/uL   Monocytes Relative 8  3 - 12 %   Monocytes Absolute 1.3 (*) 0.1 - 1.0 K/uL   Eosinophils Relative 2  0 - 5 %   Eosinophils Absolute 0.3  0.0 - 0.7 K/uL   Basophils Relative 0  0 - 1 %   Basophils Absolute 0.0  0.0 - 0.1 K/uL  BASIC METABOLIC PANEL     Status: Abnormal   Collection Time   06/25/12  5:00 AM      Component Value Range   Sodium 135  135 - 145 mEq/L   Potassium 3.5  3.5 - 5.1 mEq/L   Chloride 94 (*) 96 - 112 mEq/L   CO2 27  19 - 32 mEq/L   Glucose, Bld 65 (*) 70 - 99 mg/dL  BUN 28 (*) 6 - 23 mg/dL   Creatinine, Ser 1.61 (*) 0.50 - 1.10 mg/dL   Calcium 8.8  8.4 - 09.6 mg/dL   GFR calc non Af Amer 11 (*) >90 mL/min   GFR calc Af Amer 13 (*) >90 mL/min  CBC     Status: Abnormal   Collection Time   06/25/12  5:00 AM      Component Value Range   WBC 14.2 (*) 4.0 - 10.5 K/uL   RBC 3.56 (*) 3.87 - 5.11 MIL/uL   Hemoglobin 9.9 (*) 12.0 - 15.0 g/dL   HCT 04.5 (*) 40.9 - 81.1 %   MCV 90.2  78.0 - 100.0 fL   MCH 27.8  26.0 - 34.0 pg   MCHC 30.8  30.0 - 36.0 g/dL   RDW 91.4 (*) 78.2 - 95.6 %   Platelets 332  150 - 400 K/uL      Studies/Results: Dg Chest Port 1 View  06/24/2012  *RADIOLOGY REPORT*  Clinical Data: Weakness, shortness of breath, cough.  PORTABLE CHEST - 1 VIEW  Comparison: 03/18/2012  Findings: Right dialysis catheter remains in place, unchanged. Bilateral lower lobe airspace opacities with small effusions. Heart is mildly enlarged.  No acute bony abnormality.  IMPRESSION: Bilateral lower lobe airspace opacities, atelectasis or consolidation.  Small effusions.   Original Report Authenticated By: Cyndie Chime, M.D.     Medications:  Prior to Admission:  Prescriptions prior to admission  Medication Sig Dispense Refill  .  acetaminophen (TYLENOL) 500 MG tablet Take 500 mg by mouth every 6 (six) hours as needed. For pain      . allopurinol (ZYLOPRIM) 100 MG tablet Take 100 mg by mouth 2 (two) times daily.      Marland Kitchen aspirin EC 81 MG tablet Take 81 mg by mouth daily.      Marland Kitchen atorvastatin (LIPITOR) 40 MG tablet Take 40 mg by mouth daily.       . calcium carbonate (OS-CAL) 600 MG TABS Take 600 mg by mouth 2 (two) times daily with a meal.       . HYDROcodone-acetaminophen (VICODIN) 5-500 MG per tablet Take 0.5 tablets by mouth every 6 (six) hours as needed. For pain      . Multiple Vitamins-Minerals (CERTAVITE SENIOR/ANTIOXIDANT PO) Take by mouth daily.      . pantoprazole (PROTONIX) 40 MG tablet Take 40 mg by mouth daily.       . predniSONE (DELTASONE) 5 MG tablet Take 5 mg by mouth daily.      . sertraline (ZOLOFT) 50 MG tablet Take 50 mg by mouth daily.        Scheduled:   . allopurinol  100 mg Oral BID  . antiseptic oral rinse  15 mL Mouth Rinse BID  . aspirin EC  81 mg Oral Daily  . atorvastatin  40 mg Oral Daily  . calcium carbonate  1,250 mg Oral BID WC  . ceFEPime (MAXIPIME) IV  1 g Intravenous Once  . ceFEPime (MAXIPIME) IV  1 g Intravenous Q24H  . enoxaparin (LOVENOX) injection  30 mg Subcutaneous Q24H  . levofloxacin (LEVAQUIN) IV  750 mg Intravenous Once  . pantoprazole  40 mg Oral Daily  . piperacillin-tazobactam  3.375 g Intravenous Once  . predniSONE  5 mg Oral Daily  . sertraline  50 mg Oral Daily  . sodium chloride  3 mL Intravenous Q12H  . sodium chloride  3 mL Intravenous Q12H  . vancomycin  500 mg Intravenous  Q T,Th,Sa-HD  . vancomycin  1,000 mg Intravenous Once  . DISCONTD: sodium chloride   Intravenous STAT  . DISCONTD: ceFEPime (MAXIPIME) IV  1 g Intravenous Q8H  . DISCONTD: ceFEPime (MAXIPIME) IV  1 g Intravenous Q24H  . DISCONTD: ceFEPime (MAXIPIME) IV  1 g Intravenous Q T,Th,Sa-HD  . DISCONTD: ceFEPime (MAXIPIME) IV  1 g Intravenous Q24H  . DISCONTD: ceFEPime (MAXIPIME) IV  1 g  Intravenous Q24H  . DISCONTD: ceFEPime (MAXIPIME) IV  1 g Intravenous Q T,Th,Sa-HD   Continuous:   . DISCONTD: sodium chloride      Assessment/Plan: Principal Problem:  *Pneumonia Active Problems:  Anemia associated with chronic renal failure  Pulmonary edema  End stage renal disease  Hypoxemia  Gout  Severe aortic stenosis   Clinically patient looks stable, we will consider spirometer, continue broad-spectrum antibiotics for now and streamline his clinical course dictates.  LOS: 1 day   Lewis Keats D 06/25/2012, 10:29 AM

## 2012-06-25 NOTE — Progress Notes (Signed)
PT Cancellation Note  Patient Details Name: Miranda [name] MRN: [id number] DOB: 1922-01-09   Cancelled Treatment:    Reason Eval/Treat Not Completed: Patient at procedure or test/unavailable (pt in HD and unable to participate at this time)   [name], PT [phone]

## 2012-06-25 NOTE — Consult Note (Signed)
[facility] Renal Consultation Note    Indication for Consultation:  Management of ESRD/hemodialysis; anemia, hypertension/volume and secondary hyperparathyroidism  HPI: Miranda [name] is a 78 y.o. female with ESRD secondary to FSG with severe AS on hemodialysis since July 2013 who presented with weakness and fever/chills.  O2 sat by RN at [facility] yesterday was 81 % on room air.  She denied cough.  She has been dialyzing with out significant difficulty and lives with her husband at [facility].  She ambulates with a walker.  Her skin bruises easily. Appetite is fair.  Since last week she has had some swelling in proximal area of 2-4 fingers and difficulty closing her left hand. She was evaluated in the ED with no overt explanation for these symptoms. At that time her WBC was 17,400 (but she has chronic leukocytosis 16.2K 7/17, 17.6 K 7.23, 13.5 K 8.29 and 13.1 K 9.26) and the selling was not thought to be due to infection.  She is on low dose prednisone.She denies N, V, D or constipation. She makes urine without dysuria or urgency.  She reports a reddened area between buttocks.  She also has a planned balloon assisted maturation procedure of the cephalic vein and ligation of side branches 10/29 by Dr. Imogene Burn.  Past Medical History  Diagnosis Date  . Chronic kidney disease   . Hypertension   . Heart murmur     Dr Particia Lather follows. Not followed by a Cardiologist  . Anemia   . Gout   . Cancer     Breast- Lumpectomy.  RadiationTx  . GERD (gastroesophageal reflux disease)    Past Surgical History  Procedure Date  . Parathyroidectomy   . Breast lumpectomy     left  . Eye surgery     Cataract eyes  . Appendectomy   . Av fistula placement 02/28/2012    Procedure: ARTERIOVENOUS (AV) FISTULA CREATION;  Surgeon: Fransisco Hertz, MD;  Location: [facility] OR;  Service: Vascular;  Laterality: Right;  Ultrasound guided.  . Insertion of dialysis catheter 03/12/2012    Procedure: INSERTION OF  DIALYSIS CATHETER;  Surgeon: Chuck Hint, MD;  Location: [facility] OR;  Service: Vascular;  Laterality: N/A;  Right Internal Jugular Placement   Family History  Problem Relation Age of Onset  . Hypertension Mother   . Stroke Mother    Social History: Married. Husband is 41.  reports that she has never smoked. She has never used smokeless tobacco. She reports that she does not drink alcohol or use illicit drugs. Allergies  Allergen Reactions  . Ace Inhibitors     Pt is not sure where the allergy came from   . Sulfa Antibiotics Other (See Comments)    Unknown.  "Long time ago"   Prior to Admission medications   Medication Sig Start Date End Date Taking? Authorizing Provider  acetaminophen (TYLENOL) 500 MG tablet Take 500 mg by mouth every 6 (six) hours as needed. For pain   Yes Historical Provider, MD  allopurinol (ZYLOPRIM) 100 MG tablet Take 100 mg by mouth 2 (two) times daily.   Yes Historical Provider, MD  aspirin EC 81 MG tablet Take 81 mg by mouth daily.   Yes Historical Provider, MD  atorvastatin (LIPITOR) 40 MG tablet Take 40 mg by mouth daily.    Yes Historical Provider, MD  calcium carbonate (OS-CAL) 600 MG TABS Take 600 mg by mouth 2 (two) times daily with a meal.    Yes Historical Provider, MD  HYDROcodone-acetaminophen (VICODIN) 5-500 MG per tablet Take 0.5 tablets by mouth every 6 (six) hours as needed. For pain   Yes Historical Provider, MD  Multiple Vitamins-Minerals (CERTAVITE SENIOR/ANTIOXIDANT PO) Take by mouth daily.   Yes Historical Provider, MD  pantoprazole (PROTONIX) 40 MG tablet Take 40 mg by mouth daily.    Yes Historical Provider, MD  predniSONE (DELTASONE) 5 MG tablet Take 5 mg by mouth daily.   Yes Historical Provider, MD  sertraline (ZOLOFT) 50 MG tablet Take 50 mg by mouth daily.    Yes Historical Provider, MD   Current Facility-Administered Medications  Medication Dose Route Frequency Provider Last Rate Last Dose  . 0.9 %  sodium chloride infusion  250  mL Intravenous PRN Sorin Luanne Bras, MD      . acetaminophen (TYLENOL) tablet 500 mg  500 mg Oral Q6H PRN Sorin Luanne Bras, MD      . allopurinol (ZYLOPRIM) tablet 100 mg  100 mg Oral BID Sorin Luanne Bras, MD   100 mg at 06/24/12 2228  . antiseptic oral rinse (BIOTENE) solution 15 mL  15 mL Mouth Rinse BID Darnelle Bos, MD   15 mL at 06/25/12 0756  . aspirin EC tablet 81 mg  81 mg Oral Daily Sorin Luanne Bras, MD      . atorvastatin (LIPITOR) tablet 40 mg  40 mg Oral Daily Sorin Luanne Bras, MD      . calcium carbonate (OS-CAL - dosed in mg of elemental calcium) tablet 1,250 mg  1,250 mg Oral BID WC Sorin Luanne Bras, MD   1,250 mg at 06/25/12 0756  . ceFEPIme (MAXIPIME) 1 g in dextrose 5 % 50 mL IVPB  1 g Intravenous Once Darnelle Bos, MD   1 g at 06/25/12 0005  . ceFEPIme (MAXIPIME) 1 g in dextrose 5 % 50 mL IVPB  1 g Intravenous Q24H Ann Held, PHARMD      . enoxaparin (LOVENOX) injection 30 mg  30 mg Subcutaneous Q24H Sorin Luanne Bras, MD   30 mg at 06/24/12 1907  . HYDROcodone-acetaminophen (NORCO/VICODIN) 5-325 MG per tablet 0.5 tablet  0.5 tablet Oral QHS PRN Katy Apo, MD   0.5 tablet at 06/24/12 2229  . levofloxacin (LEVAQUIN) IVPB 750 mg  750 mg Intravenous Once Sorin Luanne Bras, MD   750 mg at 06/24/12 2005  . pantoprazole (PROTONIX) EC tablet 40 mg  40 mg Oral Daily Sorin Luanne Bras, MD      . piperacillin-tazobactam (ZOSYN) IVPB 3.375 g  3.375 g Intravenous Once Cheri Guppy, MD 100 mL/hr at 06/24/12 1707 3.375 g at 06/24/12 1707  . predniSONE (DELTASONE) tablet 5 mg  5 mg Oral Daily Sorin Luanne Bras, MD      . sertraline (ZOLOFT) tablet 50 mg  50 mg Oral Daily Sorin Luanne Bras, MD      . sodium chloride 0.9 % injection 3 mL  3 mL Intravenous Q12H Sorin C Laza, MD      . sodium chloride 0.9 % injection 3 mL  3 mL Intravenous Q12H Sorin C Laza, MD      . sodium chloride 0.9 % injection 3 mL  3 mL Intravenous PRN Sorin Luanne Bras, MD      . vancomycin (VANCOCIN) 500 mg in sodium chloride 0.9 % 100  mL IVPB  500 mg Intravenous Q T,Th,Sa-HD Darnelle Bos, MD      . vancomycin (VANCOCIN) IVPB 1000 mg/200 mL premix  1,000 mg Intravenous Once [employer]  Caporossi, MD   1,000 mg at 06/24/12 2157   Labs: Basic Metabolic Panel:  Lab 06/25/12 2841 06/24/12 1534 06/18/12 2345  NA 135 133* 134*  K 3.5 3.4* 3.1*  CL 94* 94* 92*  CO2 27 29 30   GLUCOSE 65* 102* 101*  BUN 28* 23 13  CREATININE 3.39* 2.92* 1.86*  CALCIUM 8.8 9.1 8.8  ALB -- -- --  PHOS -- -- --  CBC:  Lab 06/25/12 0500 06/24/12 1534 06/18/12 2345  WBC 14.2* 16.5* 17.4*  NEUTROABS -- 14.3* 14.8*  HGB 9.9* 10.6* 11.5*  HCT 32.1* 33.7* 36.2  MCV 90.2 90.1 88.9  PLT 332 367 334  Studies/Results: Dg Chest Port 1 View  06/24/2012  *RADIOLOGY REPORT*  Clinical Data: Weakness, shortness of breath, cough.  PORTABLE CHEST - 1 VIEW  Comparison: 03/18/2012  Findings: Right dialysis catheter remains in place, unchanged. Bilateral lower lobe airspace opacities with small effusions. Heart is mildly enlarged.  No acute bony abnormality.  IMPRESSION: Bilateral lower lobe airspace opacities, atelectasis or consolidation.  Small effusions.   Original Report Authenticated By: Cyndie Chime, M.D.    ROS: As per HPI - otherwise negative.   Physical Exam: Filed Vitals:   06/24/12 1800 06/24/12 2037 06/25/12 0502 06/25/12 1000  BP: 135/78 150/75 168/78 149/76  Pulse: 88 84 80 82  Temp: 98.6 F (37 C) 98.7 F (37.1 C) 98.1 F (36.7 C) 98.6 F (37 C)  TempSrc: Oral Oral Oral Oral  Resp: 16 16 16 18   Height: 4' 11.84" (1.52 m) 4' 11.84" (1.52 m)    Weight: 57.2 kg (126 lb 1.7 oz) 57.2 kg (126 lb 1.7 oz)    SpO2: 91% 93% 93% 94%     General: Well developed,elderly woman,resting comfortably lying on left side.. Head: Normocephalic, atraumatic, sclera non-icteric, mucus membranes are moist Neck: Supple. JVD not elevated. Lungs: Decreased BS at bases.  Rales noted on left. No wheezes. Breathing is unlabored. Heart: RRR with S1 S2  3/6 murmur no rubs, or gallops appreciated. Abdomen: Soft, non-tender, non-distended with normoactive bowel sounds. No rebound/guarding. No obvious abdominal masses. M-S:  Strength and tone somewhat diminished for advanced age. Left hand unable to fully close fist. Slight swelling of left 2-4 fingers; all fingers pale; veins prominent on dorsal side on fingers. Lower extremities :without edema or ischemic changes, no open wounds 2+ DP pulses Neuro: Alert and oriented X 3. Moves all extremities spontaneously. Psych:  Responds to questions appropriately with a normal affect. Dialysis Access: right upper AVF + bruit; right I-J catheter  Dialysis Orders: Center: [facility]  on TTS Optiflux 180 (kt/v's >2 could use 160) EDW 58.5 HD Bath 2K 2.25 Ca  Time 4 Heparin 4000 with 1000 mid treatment. Access right I-J and maturing right upper AVF  BFR 400 DFR A 1.5    Zemplar 1 mcg IV/HD Epogen 6000   Units IV/HD  Venofer  None - s/p full course Venofer with last dose 10/24.  Other  Last tmt 10/26 had minimal wt gain and got to EDW bp 160- 170 sitting.  Labs 10/24  Hgb 10.4 K 3.7 Fe 35 25 % sat Alb 3 Ca 9 P 3.8 iPTH 101  Assessment/Plan: 1.  Pneumonia - work up per primary ; on Vanc, cefepime and Levaquin 2.  Chronic inflammatory state - suggested by chronic leukocytosis and elevated ferritin 3544 10/24 (though some of that may be iron load. 3. Severe AS - fairly stable with this for now; non surgical candidate;  gentle lowering of volume. 4.  ESRD -  TTS per routine - use 4 K bath; may need to change to 3 K bath at discharge; Dr. Nicky Pugh office notified about inpatient status and to decide whether to proceed with access procedure tomorrow. 5.  Hypertension/volume  - volume control only; no meds; avoid BP drops 6.  Anemia  - Hgb trending down; dose Ararnesp 100 x 1; ferritin when from 1807 in July to 3500 after a full course of Venofer 100 x 10; tsat increased from 19 to 25% Fe increased 30 to 35.   7.  Metabolic bone  disease -  Continue binders and zemplar - well controlled. 8.  Nutrition - renal diet + supplements to augment protein and kcal  Sheffield Slider, PA-C [facility] Beeper [phone] 06/25/2012, 11:08 AM

## 2012-06-25 NOTE — Progress Notes (Signed)
[facility] Note:  Pt had been scheduled for a Balloon Assisted Maturation by Dr. Imogene Burn tomorrow 10/30.  We will cancel that procedure until the patient is more stable and recovering from her pneumonia.

## 2012-06-25 NOTE — Consult Note (Signed)
I have seen and examined this patient and agree with plan as outlined by Audree Bane, PA-C.  Unclear if this is an airspace disease such as pneumonia vs volume overload.  Will check CXR after UF with HD.  Pt denies fevers at home or productive cough.   Vascular access- pt has a poorly functioning RUE AVF and was due to have a fistulagram by Dr. Imogene Burn tomorrow.  Will contact [facility] and see when Dr. Imogene Burn would like to perform the procedure. Lyric Rossano A,MD 06/25/2012 1:57 PM

## 2012-06-25 NOTE — Progress Notes (Signed)
Occupational Therapy Evaluation Patient Details Name: Miranda [name] MRN: [id number] DOB: [birth year] Today's Date: 06/25/2012 Time: 1478-2956 OT Time Calculation (min): 23 min  OT Assessment / Plan / Recommendation Clinical Impression  78 yo with recent admission due to pneumonia. Pt scheduled to have fistula procedure when medially able. Pt will need postacute rehab. Pt prefers to have hired help and [facility] services if able. Discussed this option with son, who states that there is the option of hiring HH aids to assist pt if needed. Will continue to assess. If pt progresses well, feel that D/C home with HHOT/PT and hired assistance is an option.Pt will benefit from skilled OT services to max independence with ADL and functional mobility for ADL to facilitate D/C to next venue of care. PTA, pt used RW @ apt and w/c to go to dining hall. Son present for eval. Pt has painful L hand Pt states she has history of gout?    OT Assessment  Patient needs continued OT Services    Follow Up Recommendations  Home health OT;Skilled nursing facility;Other (comment) (pending progress)    Barriers to Discharge None    Equipment Recommendations  3 in 1 bedside comode    Recommendations for Other Services    Frequency  Min 2X/week    Precautions / Restrictions Precautions Precautions: Fall   Pertinent Vitals/Pain Pain L hand    ADL  Eating/Feeding: Set up Grooming: Minimal assistance Where Assessed - Grooming: Supported sitting Upper Body Bathing: Minimal assistance Where Assessed - Upper Body Bathing: Supported sitting Lower Body Bathing: Moderate assistance Where Assessed - Lower Body Bathing: Supported sit to stand Upper Body Dressing: Moderate assistance Where Assessed - Upper Body Dressing: Supported sitting Lower Body Dressing: Moderate assistance Where Assessed - Lower Body Dressing: Supported sit to stand Transfers/Ambulation Related to ADLs: will further assess ADL Comments: limited use L  hand? gout    OT Diagnosis: Generalized weakness;Acute pain  OT Problem List: Decreased strength;Decreased range of motion;Decreased activity tolerance;Decreased coordination;Decreased knowledge of use of DME or AE;Decreased knowledge of precautions;Cardiopulmonary status limiting activity;Impaired UE functional use;Pain;Increased edema OT Treatment Interventions: Self-care/ADL training;Therapeutic exercise;Energy conservation;DME and/or AE instruction;Therapeutic activities;Patient/family education   OT Goals Acute Rehab OT Goals OT Goal Formulation: With patient Time For Goal Achievement: 07/09/12 Potential to Achieve Goals: Good ADL Goals Pt Will Perform Upper Body Bathing: with set-up;Sitting, edge of bed ADL Goal: Upper Body Bathing - Progress: Goal set today Pt Will Perform Lower Body Bathing: with min assist;Sit to stand from bed;Supported;with cueing (comment type and amount) ADL Goal: Lower Body Bathing - Progress: Goal set today Pt Will Perform Upper Body Dressing: with set-up;Sitting, bed;Unsupported ADL Goal: Upper Body Dressing - Progress: Goal set today Pt Will Perform Lower Body Dressing: with min assist;Supported;with cueing (comment type and amount);Sit to stand from bed ADL Goal: Lower Body Dressing - Progress: Goal set today Pt Will Transfer to Toilet: with supervision;with DME;3-in-1 ADL Goal: Toilet Transfer - Progress: Goal set today Pt Will Perform Toileting - Clothing Manipulation: with supervision;Sitting on 3-in-1 or toilet;Standing ADL Goal: Toileting - Clothing Manipulation - Progress: Goal set today Pt Will Perform Toileting - Hygiene: with modified independence;Standing at 3-in-1/toilet;Sit to stand from 3-in-1/toilet ADL Goal: Toileting - Hygiene - Progress: Goal set today  Visit Information  Last OT Received On: 06/25/12 Assistance Needed: +1    Subjective Data      Prior Functioning     Home Living Lives With: Spouse Available Help at  Discharge: Available 24 hours/day  Type of Home: Apartment Home Access: Level entry Home Layout: One level Bathroom Shower/Tub: Engineer, manufacturing systems: Handicapped height Home Adaptive Equipment: Walker - rolling;Straight cane Prior Function Level of Independence: Independent with assistive device(s);Independent (used RW and w/c to go to dining hall) Able to Take Stairs?: No Driving: No Vocation: Retired Musician: No difficulties Dominant Hand: Right         Vision/Perception     Cognition  Overall Cognitive Status: Impaired Area of Impairment: Memory Arousal/Alertness: Awake/alert Orientation Level: Appears intact for tasks assessed Behavior During Session: [facility] for tasks performed Memory Deficits: decreased STM    Extremity/Trunk Assessment Right Upper Extremity Assessment RUE ROM/Strength/Tone: WFL for tasks assessed RUE Sensation: WFL - Light Touch;WFL - Proprioception RUE Coordination: WFL - gross/fine motor Left Upper Extremity Assessment LUE ROM/Strength/Tone: Deficits;Due to pain LUE ROM/Strength/Tone Deficits: limited L hand MP and IP ROM due to pain and edema LUE Sensation: WFL - Light Touch;WFL - Proprioception LUE Coordination: Deficits LUE Coordination Deficits: due to decreased ROM Trunk Assessment Trunk Assessment: Normal     Mobility Bed Mobility Bed Mobility: Supine to Sit Supine to Sit: 3: Mod assist Transfers Details for Transfer Assistance: asked to complete transfers tomorrow due to fatigue     Shoulder Instructions     Exercise     Balance  TBA   End of Session OT - End of Session Activity Tolerance: Patient limited by fatigue Patient left: in bed;with call bell/phone within reach;with family/visitor present Nurse Communication: Mobility status  GO     Demitrius Crass,HILLARY 06/25/2012, 6:13 PM [facility], OTR/L  [phone] 06/25/2012

## 2012-06-25 NOTE — Progress Notes (Signed)
ANTIBIOTIC CONSULT NOTE - FOLLOW UP  Pharmacy Consult for Vancomycin + Cefepime Indication: HCAP  Allergies  Allergen Reactions  . Ace Inhibitors     Pt is not sure where the allergy came from   . Sulfa Antibiotics Other (See Comments)    Unknown.  "Long time ago"    Patient Measurements: Height: 4' 11.84" (152 cm) Weight: 126 lb 1.7 oz (57.2 kg) IBW/kg (Calculated) : 45.13   Vital Signs: Temp: 98.1 F (36.7 C) (10/29 0502) Temp src: Oral (10/29 0502) BP: 168/78 mmHg (10/29 0502) Pulse Rate: 80  (10/29 0502) Intake/Output from previous day: 10/28 0701 - 10/29 0700 In: 510 [P.O.:360; IV Piggyback:150] Out: -  Intake/Output from this shift:    Labs:  Basename 06/25/12 0500 06/24/12 1534  WBC 14.2* 16.5*  HGB 9.9* 10.6*  PLT 332 367  LABCREA -- --  CREATININE -- 2.92*   Estimated Creatinine Clearance: 10.5 ml/min (by C-G formula based on Cr of 2.92). No results found for this basename: VANCOTROUGH:2,VANCOPEAK:2,VANCORANDOM:2,GENTTROUGH:2,GENTPEAK:2,GENTRANDOM:2,TOBRATROUGH:2,TOBRAPEAK:2,TOBRARND:2,AMIKACINPEAK:2,AMIKACINTROU:2,AMIKACIN:2, in the last 72 hours   Microbiology: Recent Results (from the past 720 hour(s))  CULTURE, BLOOD (ROUTINE X 2)     Status: Normal   Collection Time   06/19/12  1:00 AM      Component Value Range Status Comment   Specimen Description BLOOD LEFT ARM   Final    Special Requests BOTTLES DRAWN AEROBIC ONLY 5CC   Final    Culture  Setup Time 06/19/2012 08:50   Final    Culture NO GROWTH 5 DAYS   Final    Report Status 06/25/2012 FINAL   Final   CULTURE, BLOOD (ROUTINE X 2)     Status: Normal   Collection Time   06/19/12  1:05 AM      Component Value Range Status Comment   Specimen Description BLOOD LEFT HAND   Final    Special Requests BOTTLES DRAWN AEROBIC AND ANAEROBIC 5CC   Final    Culture  Setup Time 06/19/2012 08:50   Final    Culture NO GROWTH 5 DAYS   Final    Report Status 06/25/2012 FINAL   Final     Current  Antibiotics: Vancomycin 500 mg post HD T/Th/Sat Cefepime 1g post HD T/Th/Sat  Assessment: 78 y.o. F with ESRD on Vancomycin + Cefepime for HCAP confirmed on CXR. Cultures are still pending. The patient's normal HD schedule is T/Th/Sat. The patient was loaded with Vancomycin on 10/28 evening and will plan to continue maintenance doses post HD sessions. Will adjust Cefepime dose to daily for now -- this can be adjusted to be given post HD sessions upon discharge.  Goal of Therapy:  Pre-HD Vancomycin level of 15-25 mcg/ml  Plan:  1. Continue Vancomycin 500 mg post HD sessions on T/Th/Sat 2. Adjust Cefepime to 1g IV every 24 hours (starting at 2200 today) 3. Will continue to follow HD schedule/duration, culture results, LOT, and antibiotic de-escalation plans   Georgina Pillion, PharmD, BCPS Clinical Pharmacist Pager: [phone] 06/25/2012 8:47 AM

## 2012-06-25 NOTE — Progress Notes (Signed)
Utilization review completed.  

## 2012-06-25 NOTE — Procedures (Signed)
Patient was seen on dialysis and the procedure was supervised. BFR 400 Via PC BP is 187/91.  Patient appears to be tolerating treatment well

## 2012-06-26 ENCOUNTER — Inpatient Hospital Stay (HOSPITAL_COMMUNITY): Payer: Medicare Other

## 2012-06-26 ENCOUNTER — Encounter (HOSPITAL_COMMUNITY): Admission: RE | Payer: Self-pay | Source: Ambulatory Visit

## 2012-06-26 SURGERY — BALLOON MATURATION
Anesthesia: General | Site: Arm Upper | Laterality: Right

## 2012-06-26 MED ORDER — HEPARIN SODIUM (PORCINE) 1000 UNIT/ML DIALYSIS
20.0000 [IU]/kg | INTRAMUSCULAR | Status: DC | PRN
  Administered 2012-06-27: 1100 [IU] via INTRAVENOUS_CENTRAL
  Filled 2012-06-26: qty 2

## 2012-06-26 NOTE — Progress Notes (Signed)
Subjective: Patient complain of some vague right knee pain, She does have a history of gout, there's no fever or chills no productive cough.She has one out of two positive blood culture.  Objective: Vital signs in last 24 hours: Temp:  [97.4 F (36.3 C)-98.9 F (37.2 C)] 97.7 F (36.5 C) (10/30 0601) Pulse Rate:  [78-94] 87  (10/30 0923) Resp:  [17-31] 18  (10/30 0923) BP: (124-187)/(60-94) 161/83 mmHg (10/30 0923) SpO2:  [90 %-95 %] 95 % (10/30 0923) Weight:  [57.1 kg (125 lb 14.1 oz)-59.5 kg (131 lb 2.8 oz)] 57.1 kg (125 lb 14.1 oz) (10/29 1655) Weight change: 2.3 kg (5 lb 1.1 oz) Last BM Date: 06/24/12  Intake/Output from previous day: 10/29 0701 - 10/30 0700 In: 290 [P.O.:240; IV Piggyback:50] Out: 2466 [Urine:150] Intake/Output this shift: Total I/O In: 120 [P.O.:120] Out: -   General appearance: alert and cooperative Resp: decreased breath sounds at base Cardio: regular rate and rhythm, S1, S2 normal, no murmur, click, rub or gallop GI: soft, non-tender; bowel sounds normal; no masses,  no organomegaly Extremities: no clubbing cyanosis or edema, right knee full range of motion without warmth  Lab Results:  No results found for this or any previous visit (from the past 24 hour(s)).    Studies/Results: Dg Chest Port 1 View  06/26/2012  *RADIOLOGY REPORT*  Clinical Data: 78 year old female shortness of breath.  PORTABLE CHEST - 1 VIEW  Comparison: 06/24/2012 and earlier.  Findings: Seated AP portable view at 1006 hours.  Stable right IJ approach dual lumen dialysis type catheter.  Increase veiling opacity in the lower lungs compatible with pleural effusions. Associated dense retrocardiac and lung base opacity.  Increased pulmonary vascular congestion.  No pneumothorax.  Stable cardiac size and mediastinal contours.  IMPRESSION: Increased pleural effusions and pulmonary vascularity most suggestive of volume overload/interstitial edema.  Associated bibasilar collapse or  consolidation.   Original Report Authenticated By: Harley Hallmark, M.D.    Dg Chest Port 1 View  06/24/2012  *RADIOLOGY REPORT*  Clinical Data: Weakness, shortness of breath, cough.  PORTABLE CHEST - 1 VIEW  Comparison: 03/18/2012  Findings: Right dialysis catheter remains in place, unchanged. Bilateral lower lobe airspace opacities with small effusions. Heart is mildly enlarged.  No acute bony abnormality.  IMPRESSION: Bilateral lower lobe airspace opacities, atelectasis or consolidation.  Small effusions.   Original Report Authenticated By: Cyndie Chime, M.D.     Medications:  Prior to Admission:  Prescriptions prior to admission  Medication Sig Dispense Refill  . acetaminophen (TYLENOL) 500 MG tablet Take 500 mg by mouth every 6 (six) hours as needed. For pain      . allopurinol (ZYLOPRIM) 100 MG tablet Take 100 mg by mouth 2 (two) times daily.      Marland Kitchen aspirin EC 81 MG tablet Take 81 mg by mouth daily.      Marland Kitchen atorvastatin (LIPITOR) 40 MG tablet Take 40 mg by mouth daily.       . calcium carbonate (OS-CAL) 600 MG TABS Take 600 mg by mouth 2 (two) times daily with a meal.       . HYDROcodone-acetaminophen (VICODIN) 5-500 MG per tablet Take 0.5 tablets by mouth every 6 (six) hours as needed. For pain      . Multiple Vitamins-Minerals (CERTAVITE SENIOR/ANTIOXIDANT PO) Take by mouth daily.      . pantoprazole (PROTONIX) 40 MG tablet Take 40 mg by mouth daily.       . predniSONE (DELTASONE) 5 MG tablet  Take 5 mg by mouth daily.      . sertraline (ZOLOFT) 50 MG tablet Take 50 mg by mouth daily.        Scheduled:   . allopurinol  100 mg Oral BID  . antiseptic oral rinse  15 mL Mouth Rinse BID  . aspirin EC  81 mg Oral Daily  . atorvastatin  40 mg Oral Daily  . calcium carbonate  1,250 mg Oral BID WC  . ceFEPime (MAXIPIME) IV  1 g Intravenous Q24H  . cefUROXime (ZINACEF)  IV  1.5 g Intravenous 60 min Pre-Op  . darbepoetin (ARANESP) injection - DIALYSIS  100 mcg Intravenous Q Tue-HD  .  enoxaparin (LOVENOX) injection  30 mg Subcutaneous Q24H  . feeding supplement (NEPRO CARB STEADY)  237 mL Oral BID BM  . pantoprazole  40 mg Oral Daily  . paricalcitol  1 mcg Intravenous 3 times weekly  . predniSONE  5 mg Oral Daily  . sertraline  50 mg Oral Daily  . sodium chloride  3 mL Intravenous Q12H  . sodium chloride  3 mL Intravenous Q12H  . vancomycin  500 mg Intravenous Q T,Th,Sa-HD   Continuous:   Assessment/Plan: Healthcare associated pneumonia, currently on broad-spectrum antibiotics. Patient remains afebrile, respiratory status stable, check followup x-ray. End-stage renal disease One of 2 positive blood cultures for gram-positive cocci in cluster, followup culture continue current antibiotics Knee pain, exam fairly benign, Patient does have underlying history of gout    LOS: 2 days   Mallory Enriques D 06/26/2012, 11:00 AM

## 2012-06-26 NOTE — Progress Notes (Signed)
I have seen and examined this patient and agree with plan as outlined by Audree Bane PA-C.  Will hold off on fistulogram so her son can be present.  Feels better.  Agree with IS at bedside. Romie Keeble A,MD 06/26/2012 10:38 AM

## 2012-06-26 NOTE — Progress Notes (Signed)
Physical Therapy Evaluation Patient Details Name: Miranda [name] MRN: [id number] DOB: 07/27/1922 Today's Date: 06/26/2012 Time: 1027-2536 PT Time Calculation (min): 20 min  PT Assessment / Plan / Recommendation Clinical Impression  78 yo female admitted with pneumonia; Presents with decr functional mobility, decr functional capacity; Will benefit from PT to maximize independence and safety with mobility, and to facilitate dc planning;   Agree with OT -- if pt progresses well, and has adequate assist at home (possibly hired help?) dc back to home with HHtherapies is best option; If slow progress, and/or inadequate assist available, we must consider SNF    PT Assessment  Patient needs continued PT services    Follow Up Recommendations  Home health PT;Supervision/Assistance - 24 hour;Post acute inpatient    Does the patient have the potential to tolerate intense rehabilitation   No, Recommend SNF  Barriers to Discharge Decreased caregiver support      Equipment Recommendations  3 in 1 bedside comode    Recommendations for Other Services     Frequency Min 3X/week    Precautions / Restrictions Precautions Precautions: Fall Restrictions Other Position/Activity Restrictions: Desats with acitivity   Pertinent Vitals/Pain Pain in R knee, L hand, did not rate; repositioned in recliner      Mobility  Bed Mobility Bed Mobility: Supine to Sit Supine to Sit: 4: Min assist;With rails Details for Bed Mobility Assistance: Cues for technique, and min physical assist to lift shoulders from bed Transfers Transfers: Sit to Stand;Stand to Sit Sit to Stand: 4: Min assist;From bed;With upper extremity assist Stand to Sit: 4: Min assist;To chair/3-in-1;With upper extremity assist Details for Transfer Assistance: Cues for safety and hand placement on RW once standing to minimize R knee pain Ambulation/Gait Ambulation/Gait Assistance: 4: Min assist Ambulation Distance (Feet): 4 Feet (pivot  steps to recliner for breakfast) Assistive device: Rolling walker Ambulation/Gait Assistance Details: Cues to push down into RW with UEs as able to [facility] painful R knee    Shoulder Instructions     Exercises     PT Diagnosis: Difficulty walking;Generalized weakness  PT Problem List: Decreased strength;Decreased range of motion;Decreased activity tolerance;Decreased mobility;Decreased knowledge of use of DME;Cardiopulmonary status limiting activity;Pain PT Treatment Interventions: DME instruction;Gait training;Functional mobility training;Therapeutic activities;Therapeutic exercise;Patient/family education   PT Goals Acute Rehab PT Goals PT Goal Formulation: With patient Time For Goal Achievement: 07/10/12 Potential to Achieve Goals: Good Pt will go Supine/Side to Sit: with supervision PT Goal: Supine/Side to Sit - Progress: Goal set today Pt will go Sit to Supine/Side: with supervision PT Goal: Sit to Supine/Side - Progress: Goal set today Pt will go Sit to Stand: with supervision PT Goal: Sit to Stand - Progress: Goal set today Pt will go Stand to Sit: with supervision PT Goal: Stand to Sit - Progress: Goal set today Pt will Transfer Bed to Chair/Chair to Bed: with supervision PT Transfer Goal: Bed to Chair/Chair to Bed - Progress: Goal set today Pt will Ambulate: 51 - 150 feet;with supervision;with rolling walker PT Goal: Ambulate - Progress: Goal set today Pt will Perform Home Exercise Program: Independently PT Goal: Perform Home Exercise Program - Progress: Goal set today  Visit Information  Last PT Received On: 06/26/12 Assistance Needed: +1    Subjective Data  Subjective: Agreeable to OOB Patient Stated Goal: feel better   Prior Functioning  Home Living Lives With: Spouse Available Help at Discharge: Available 24 hours/day Type of Home: Apartment Home Access: Level entry Home Layout: One level Bathroom Shower/Tub: Tub/shower  unit Bathroom Toilet: Handicapped  height Home Adaptive Equipment: Walker - rolling;Straight cane;Wheelchair - manual Prior Function Level of Independence:  (Husband pushes her to dining hall in wheelchair) Able to Take Stairs?: No Driving: No Vocation: Retired Musician: No difficulties Dominant Hand: Right    Cognition  Overall Cognitive Status: Appears within functional limits for tasks assessed/performed Arousal/Alertness: Awake/alert Orientation Level: Appears intact for tasks assessed Behavior During Session: [facility] for tasks performed    Extremity/Trunk Assessment Right Upper Extremity Assessment RUE ROM/Strength/Tone: [facility] for tasks assessed Left Upper Extremity Assessment LUE ROM/Strength/Tone: Deficits;Due to pain LUE ROM/Strength/Tone Deficits: limited L hand MP and IP ROM due to pain and edema Right Lower Extremity Assessment RLE ROM/Strength/Tone: Deficits RLE ROM/Strength/Tone Deficits: Generally weak and dependent on UE support with upright activity Left Lower Extremity Assessment LLE ROM/Strength/Tone: Deficits LLE ROM/Strength/Tone Deficits: Generally weak and dependent on UE support for upright activity Trunk Assessment Trunk Assessment: Normal   Balance    End of Session PT - End of Session Equipment Utilized During Treatment: Oxygen Activity Tolerance: Patient limited by fatigue Patient left: in chair;with call bell/phone within reach (preparing for breakfast) Nurse Communication: Mobility status;Other (comment) (Desatted with activity)  GP     [facility] [place], [place] [phone]  06/26/2012, 9:37 AM

## 2012-06-26 NOTE — Progress Notes (Signed)
Miranda [name] Progress Note  Subjective:  Feels better today.  Felt like a popsicle on HD yesterday. Right knee hurts ? gout  Objective Filed Vitals:   06/25/12 1715 06/25/12 2231 06/26/12 0601 06/26/12 0923  BP: 136/72 124/62 151/81 161/83  Pulse: 93 89 85 87  Temp: 98.6 F (37 C) 98.9 F (37.2 C) 97.7 F (36.5 C)   TempSrc: Oral Oral Oral   Resp: 20 18 17 18   Height:      Weight:      SpO2: 91% 94% 94% 95%   Physical Exam General: Sitting in chair Eating breakfast; looks better Heart: RRR 3/6 murmur Lungs: decreased BS 1/3 up bilaterally Abdomen:soft Extremities: no Le edema; left finger swelling improved and can close hand in grip. Bilateral arthritic knees.  No redness or erythema Dialysis Access:  Right upper AVF maturing and right I-J [facility]  Dialysis Orders: Center: [facility] on TTS Optiflux 180 (kt/v's >2 could use 160)  EDW 58.5 HD Bath 2K 2.25 Ca Time 4 Heparin 4000 with 1000 mid treatment. Access right I-J and maturing right upper AVF BFR 400 DFR A 1.5  Zemplar 1 mcg IV/HD Epogen 6000 Units IV/HD Venofer None - s/p full course Venofer with last dose 10/24. Other Last tmt 10/26 had minimal wt gain and got to EDW bp 160- 170 sitting. Labs 10/24 Hgb 10.4 K 3.7 Fe 35 25 % sat Alb 3 Ca 9 P 3.8 iPTH 101  Assessment/Plan:  1. Pneumonia - work up per primary ; on Vanc, cefepime and Levaquin; some/all of hypoxia could be volume related 2. Chronic inflammatory state - suggested by chronic leukocytosis and elevated ferritin 3544 10/24 (though some of that may be iron load. 3. Severe AS - fairly stable with this for now; non surgical candidate; gentle lowering of volume. 4. ESRD - TTS per routine - used 4 K bath Tuesday; may need to change to 3 K bath at discharge;  AVF maturation procedure cancelled.  I spoke with her son, Merlyn Albert yesterday and he wishes to reschedule as he is the one who takes her to these procedures.Hypertension/volume - volume control only; no meds; UF Tuesday  2.3 with post wt 57.1 and Bp stable except drop into 120s.  Check CXR today to help dscern volume stats vs PNA 5. Anemia - Hgb trending down; dose Ararnesp 100 x 1; ferritin when from 1807 in July to 3500 after a full course of Venofer 100 x 10; tsat increased from 19 to 25% Fe increased 30 to 35.  Recheck with Wed HD 6. Metabolic bone disease - Continue binders and zemplar - well controlled. 7. Nutrition - renal diet + supplements to augment protein and kcal; I suspect she is losing wt.    Sheffield Slider, PA-C [name] [phone]  06/26/2012,9:36 AM  LOS: 2 days    Additional Objective Labs: Basic Metabolic Panel:  Lab 06/25/12 4540 06/24/12 1534  NA 135 133*  K 3.5 3.4*  CL 94* 94*  CO2 27 29  GLUCOSE 65* 102*  BUN 28* 23  CREATININE 3.39* 2.92*  CALCIUM 8.8 9.1  ALB -- --  PHOS -- --  CBC:  Lab 06/25/12 0500 06/24/12 1534  WBC 14.2* 16.5*  NEUTROABS -- 14.3*  HGB 9.9* 10.6*  HCT 32.1* 33.7*  MCV 90.2 90.1  PLT 332 367   Blood Culture    Component Value Date/Time   SDES BLOOD HAND LEFT 06/24/2012 1648   SPECREQUEST BOTTLES DRAWN AEROBIC AND ANAEROBIC  10CC 06/24/2012 1648   CULT  Value: GRAM POSITIVE COCCI IN CLUSTERS Note: Gram Stain Report Called to,Read Back By and Verified With:  SYBIL [facility]  RN AT 1946 ON 06/25/2012 BY RHYNA 06/24/2012 1648   REPTSTATUS PENDING 06/24/2012 1648  Studies/Results: Dg Chest Port 1 View  06/24/2012  *RADIOLOGY REPORT*  Clinical Data: Weakness, shortness of breath, cough.  PORTABLE CHEST - 1 VIEW  Comparison: 03/18/2012  Findings: Right dialysis catheter remains in place, unchanged. Bilateral lower lobe airspace opacities with small effusions. Heart is mildly enlarged.  No acute bony abnormality.  IMPRESSION: Bilateral lower lobe airspace opacities, atelectasis or consolidation.  Small effusions.   Original Report Authenticated By: Cyndie Chime, M.D.    Medications:      . allopurinol  100 mg Oral  BID  . antiseptic oral rinse  15 mL Mouth Rinse BID  . aspirin EC  81 mg Oral Daily  . atorvastatin  40 mg Oral Daily  . calcium carbonate  1,250 mg Oral BID WC  . ceFEPime (MAXIPIME) IV  1 g Intravenous Q24H  . cefUROXime (ZINACEF)  IV  1.5 g Intravenous 60 min Pre-Op  . darbepoetin (ARANESP) injection - DIALYSIS  100 mcg Intravenous Q Tue-HD  . enoxaparin (LOVENOX) injection  30 mg Subcutaneous Q24H  . feeding supplement (NEPRO CARB STEADY)  237 mL Oral BID BM  . pantoprazole  40 mg Oral Daily  . paricalcitol  1 mcg Intravenous 3 times weekly  . predniSONE  5 mg Oral Daily  . sertraline  50 mg Oral Daily  . sodium chloride  3 mL Intravenous Q12H  . sodium chloride  3 mL Intravenous Q12H  . vancomycin  500 mg Intravenous Q T,Th,Sa-HD

## 2012-06-26 NOTE — Progress Notes (Signed)
Occupational Therapy Treatment Patient Details Name: Miranda [name] MRN: [id number] DOB: Feb 05, 1922 Today's Date: 06/26/2012 Time: 1200-1229 OT Time Calculation (min): 29 min  OT Assessment / Plan / Recommendation Comments on Treatment Session Pt making progress. Desats with activity on 2L to low 80s. O2 increased to 3L. Assistance required for mobility. Discussed D/C options with pt, including CIR. Pt not interested in CIR because she does not want to be away from her husband and he does not drive. Pt will either need 24/7 hired assistance or SNF for rehab. I recommend SNF for rehab as hired care is not always reliable. Discussed these options with pt, who stated that she would prefer ALF. ALF is not an option as pt requires A for all mobility at this time.    Follow Up Recommendations  Home health OT;Skilled nursing facility;Other (comment)    Barriers to Discharge       Equipment Recommendations  3 in 1 bedside comode    Recommendations for Other Services    Frequency Min 2X/week   Plan Discharge plan remains appropriate    Precautions / Restrictions Precautions Precautions: Fall Restrictions Other Position/Activity Restrictions: Desats with acitivity   Pertinent Vitals/Pain C/o pain L knee and hand. nsg aware    ADL  Grooming: Supervision/safety Where Assessed - Grooming: Unsupported standing Upper Body Dressing: Minimal assistance Lower Body Dressing: Moderate assistance Toilet Transfer: Moderate assistance Toilet Transfer Method: Sit to stand Toilet Transfer Equipment: Comfort height toilet Toileting - Clothing Manipulation and Hygiene: Minimal assistance Transfers/Ambulation Related to ADLs: mod A from recliner due to low height. vc to improve positioning ADL Comments: Pt participates in all ADL. Desats with activity to 80 O2 2L. Increased to3L. O2 91-93 with activity.    OT Diagnosis:    OT Problem List:   OT Treatment Interventions:     OT Goals Acute Rehab OT  Goals OT Goal Formulation: With patient Time For Goal Achievement: 07/09/12 Potential to Achieve Goals: Good ADL Goals Pt Will Perform Upper Body Bathing: with set-up;Sitting, edge of bed ADL Goal: Upper Body Bathing - Progress: Progressing toward goals Pt Will Perform Lower Body Bathing: with min assist;Sit to stand from bed;Supported;with cueing (comment type and amount) ADL Goal: Lower Body Bathing - Progress: Progressing toward goals Pt Will Perform Upper Body Dressing: with set-up;Sitting, bed;Unsupported ADL Goal: Upper Body Dressing - Progress: Progressing toward goals Pt Will Perform Lower Body Dressing: with min assist;Supported;with cueing (comment type and amount);Sit to stand from bed ADL Goal: Lower Body Dressing - Progress: Progressing toward goals Pt Will Transfer to Toilet: with supervision;with DME;3-in-1 ADL Goal: Toilet Transfer - Progress: Progressing toward goals Pt Will Perform Toileting - Clothing Manipulation: with supervision;Sitting on 3-in-1 or toilet;Standing ADL Goal: Toileting - Clothing Manipulation - Progress: Progressing toward goals Pt Will Perform Toileting - Hygiene: with modified independence;Standing at 3-in-1/toilet;Sit to stand from 3-in-1/toilet ADL Goal: Toileting - Hygiene - Progress: Progressing toward goals  Visit Information  Last OT Received On: 06/26/12 Assistance Needed: +1    Subjective Data      Prior Functioning  Home Living Lives With: Spouse Available Help at Discharge: Available 24 hours/day Type of Home: Apartment Home Access: Level entry Home Layout: One level Bathroom Shower/Tub: Engineer, manufacturing systems: Handicapped height Home Adaptive Equipment: Walker - rolling;Straight cane;Wheelchair - manual Prior Function Level of Independence:  (Husband pushes her to dining hall in wheelchair) Able to Take Stairs?: No Driving: No Vocation: Retired Musician: No difficulties Dominant Hand: Right  Cognition  Overall Cognitive Status: Appears within functional limits for tasks assessed/performed Area of Impairment: Memory Arousal/Alertness: Awake/alert Orientation Level: Appears intact for tasks assessed Behavior During Session: [facility] for tasks performed Memory: Decreased recall of precautions Memory Deficits: decreased STM    Mobility  Shoulder Instructions Bed Mobility Bed Mobility: Supine to Sit Supine to Sit: 4: Min assist;With rails Details for Bed Mobility Assistance: Cues for technique, and min physical assist to lift shoulders from bed Transfers Transfers: Sit to Stand;Stand to Sit Sit to Stand: 3: Mod assist;From chair/3-in-1;With upper extremity assist Stand to Sit: 3: Mod assist;To chair/3-in-1;With upper extremity assist Details for Transfer Assistance: uncontrolled descent       Exercises      Balance  Min a   End of Session OT - End of Session Equipment Utilized During Treatment: Gait belt Activity Tolerance: Patient limited by fatigue Patient left: in chair;with call bell/phone within reach Nurse Communication: Mobility status  GO     Hansika Leaming,HILLARY 06/26/2012, 12:52 PM [facility], OTR/L  [phone] 06/26/2012

## 2012-06-27 ENCOUNTER — Inpatient Hospital Stay (HOSPITAL_COMMUNITY): Payer: Medicare Other

## 2012-06-27 DIAGNOSIS — R0902 Hypoxemia: Secondary | ICD-10-CM

## 2012-06-27 DIAGNOSIS — J189 Pneumonia, unspecified organism: Principal | ICD-10-CM

## 2012-06-27 DIAGNOSIS — J811 Chronic pulmonary edema: Secondary | ICD-10-CM

## 2012-06-27 DIAGNOSIS — J90 Pleural effusion, not elsewhere classified: Secondary | ICD-10-CM

## 2012-06-27 LAB — RENAL FUNCTION PANEL
Albumin: 1.9 g/dL — ABNORMAL LOW (ref 3.5–5.2)
BUN: 29 mg/dL — ABNORMAL HIGH (ref 6–23)
CO2: 27 mEq/L (ref 19–32)
Calcium: 8.9 mg/dL (ref 8.4–10.5)
Chloride: 94 mEq/L — ABNORMAL LOW (ref 96–112)
Creatinine, Ser: 3.09 mg/dL — ABNORMAL HIGH (ref 0.50–1.10)
GFR calc Af Amer: 14 mL/min — ABNORMAL LOW (ref >90)
GFR calc non Af Amer: 12 mL/min — ABNORMAL LOW (ref >90)
Glucose, Bld: 75 mg/dL (ref 70–99)
Phosphorus: 3.6 mg/dL (ref 2.3–4.6)
Potassium: 3.5 mEq/L (ref 3.5–5.1)
Sodium: 132 mEq/L — ABNORMAL LOW (ref 135–145)

## 2012-06-27 LAB — CULTURE, BLOOD (ROUTINE X 2)

## 2012-06-27 LAB — CBC
HCT: 31.6 % — ABNORMAL LOW (ref 36.0–46.0)
Hemoglobin: 9.8 g/dL — ABNORMAL LOW (ref 12.0–15.0)
MCH: 27.7 pg (ref 26.0–34.0)
MCHC: 31 g/dL (ref 30.0–36.0)
MCV: 89.3 fL (ref 78.0–100.0)
Platelets: 355 10*3/uL (ref 150–400)
RBC: 3.54 MIL/uL — ABNORMAL LOW (ref 3.87–5.11)
RDW: 20.1 % — ABNORMAL HIGH (ref 11.5–15.5)
WBC: 14.7 10*3/uL — ABNORMAL HIGH (ref 4.0–10.5)

## 2012-06-27 MED ORDER — LEVOFLOXACIN 750 MG PO TABS
750.0000 mg | ORAL_TABLET | Freq: Once | ORAL | Status: AC
  Administered 2012-06-27: 750 mg via ORAL
  Filled 2012-06-27: qty 1

## 2012-06-27 MED ORDER — LEVOFLOXACIN 500 MG PO TABS
500.0000 mg | ORAL_TABLET | ORAL | Status: DC
  Administered 2012-06-29 – 2012-07-01 (×2): 500 mg via ORAL
  Filled 2012-06-27 (×3): qty 1

## 2012-06-27 MED ORDER — HEPARIN SODIUM (PORCINE) 1000 UNIT/ML DIALYSIS
20.0000 [IU]/kg | INTRAMUSCULAR | Status: DC | PRN
  Filled 2012-06-27: qty 2

## 2012-06-27 MED ORDER — VANCOMYCIN HCL 500 MG IV SOLR: 250.0000 mg | INTRAVENOUS | Status: DC

## 2012-06-27 MED ORDER — ACETAMINOPHEN 325 MG PO TABS
ORAL_TABLET | ORAL | Status: AC
  Filled 2012-06-27: qty 2

## 2012-06-27 MED ORDER — PARICALCITOL 5 MCG/ML IV SOLN
INTRAVENOUS | Status: AC
  Administered 2012-06-27: 1 ug
  Filled 2012-06-27: qty 1

## 2012-06-27 NOTE — Consult Note (Signed)
Patient: Miranda [name] DOB: September 03, 1921 Date of Admission: 06/24/2012            Pulmonary consult  Date of Consult: 06/27/2012 MD requesting consult:  Polite  Reason for consult:  PNA, Pleural effusion   HPI -  78yo female with hx ESRD, HTN, severe aortic stenosis (not surgical candidate) presented 10/28 with SOB, cough, fever and BLE edema.  She was found to have RLL infiltrate and was admitted with HCAP.   She was treated with abx but cont to have difficulty with volume overload in the setting of ESRD and severe aortic stenosis.  CXR on 10/31 showed mod pleural effusion and PCCM consulted for further recs.    Allergies:  Allergies  Allergen Reactions  . Ace Inhibitors     Pt is not sure where the allergy came from   . Sulfa Antibiotics Other (See Comments)    Unknown.  "Long time ago"     PMH: Past Medical History  Diagnosis Date  . Chronic kidney disease   . Hypertension   . Heart murmur     Dr Particia Lather follows. Not followed by a Cardiologist  . Anemia   . Gout   . Cancer     Breast- Lumpectomy.  RadiationTx  . GERD (gastroesophageal reflux disease)     Home meds: Medications Prior to Admission  Medication Sig Dispense Refill  . acetaminophen (TYLENOL) 500 MG tablet Take 500 mg by mouth every 6 (six) hours as needed. For pain      . allopurinol (ZYLOPRIM) 100 MG tablet Take 100 mg by mouth 2 (two) times daily.      Marland Kitchen aspirin EC 81 MG tablet Take 81 mg by mouth daily.      Marland Kitchen atorvastatin (LIPITOR) 40 MG tablet Take 40 mg by mouth daily.       . calcium carbonate (OS-CAL) 600 MG TABS Take 600 mg by mouth 2 (two) times daily with a meal.       . HYDROcodone-acetaminophen (VICODIN) 5-500 MG per tablet Take 0.5 tablets by mouth every 6 (six) hours as needed. For pain      . Multiple Vitamins-Minerals (CERTAVITE SENIOR/ANTIOXIDANT PO) Take by mouth daily.      . pantoprazole (PROTONIX) 40 MG tablet Take 40 mg by mouth daily.       . predniSONE (DELTASONE) 5 MG tablet Take 5  mg by mouth daily.      . sertraline (ZOLOFT) 50 MG tablet Take 50 mg by mouth daily.          Social Hx: History   Social History  . Marital Status: Married    Spouse Name: N/A    Number of Children: N/A  . Years of Education: N/A   Occupational History  . Not on file.   Social History Main Topics  . Smoking status: Never Smoker   . Smokeless tobacco: Never Used  . Alcohol Use: No  . Drug Use: No  . Sexually Active: Not on file   Other Topics Concern  . Not on file   Social History Narrative  . No narrative on file     Family Hx: Family History  Problem Relation Age of Onset  . Hypertension Mother   . Stroke Mother      ROS: Review of Systems - No active c/o at this time.  Has intermittently had BLE edema, productive cough (but unable to expectorate sputum), mild SOB.  Denies chest pain, hemoptysis, n/v/d.   Filed Vitals:  06/27/12 1030 06/27/12 1059 06/27/12 1130 06/27/12 1218  BP: 129/54 131/53 123/50 119/47  Pulse: 74 73 78 78  Temp:   97.7 F (36.5 C) 98.3 F (36.8 C)  TempSrc:   Oral Oral  Resp: 22 18 24 19   Height:      Weight:   126 lb 15.8 oz (57.6 kg)   SpO2:  97% 99% 97%    chest X-ray Dg Chest 2 View  06/26/2012  *RADIOLOGY REPORT*  Clinical Data: Preop evaluation for pneumonia.  CHEST - 2 VIEW  Comparison: Chest x-ray 06/26/2012.  Findings: Right internal jugular PermCath in position with the tips terminating in the distal superior vena cava and superior aspect of right atrium.  Lung volumes are normal.  Bibasilar opacities may represent areas of atelectasis and/or consolidation, with superimposed bilateral pleural effusions (moderate on the right and small on the left).  Pulmonary vascular crowding, without frank pulmonary edema.  Heart size is mildly enlarged (unchanged). The patient is rotated to the left on today's exam, resulting in distortion of the mediastinal contours and reduced diagnostic sensitivity and specificity for mediastinal  pathology. Atherosclerosis in the thoracic aorta.  Extensive mitral annular calcifications.  IMPRESSION: 1.  Cardiomegaly with pulmonary venous congestion, but no frank pulmonary edema. 2.  Bibasilar atelectasis and/or consolidation with superimposed moderate right and small left-sided pleural effusions.   Original Report Authenticated By: Florencia Reasons, M.D.    Dg Chest Port 1 View  06/26/2012  *RADIOLOGY REPORT*  Clinical Data: 78 year old female shortness of breath.  PORTABLE CHEST - 1 VIEW  Comparison: 06/24/2012 and earlier.  Findings: Seated AP portable view at 1006 hours.  Stable right IJ approach dual lumen dialysis type catheter.  Increase veiling opacity in the lower lungs compatible with pleural effusions. Associated dense retrocardiac and lung base opacity.  Increased pulmonary vascular congestion.  No pneumothorax.  Stable cardiac size and mediastinal contours.  IMPRESSION: Increased pleural effusions and pulmonary vascularity most suggestive of volume overload/interstitial edema.  Associated bibasilar collapse or consolidation.   Original Report Authenticated By: Harley Hallmark, M.D.      CBC  Lab 06/27/12 0830 06/25/12 0500 06/24/12 1534  HGB 9.8* 9.9* 10.6*  HCT 31.6* 32.1* 33.7*  WBC 14.7* 14.2* 16.5*  PLT 355 332 367     BMET  Lab 06/27/12 0830 06/25/12 0500 06/24/12 1534  NA 132* 135 133*  K 3.5 3.5 --  CL 94* 94* 94*  CO2 27 27 29   GLUCOSE 75 65* 102*  BUN 29* 28* 23  CREATININE 3.09* 3.39* 2.92*  CALCIUM 8.9 8.8 9.1  MG -- -- --  PHOS 3.6 -- --     EXAM: General: pleasant, chronically ill appearing female, NAD in bed eating lunch  Neuro: awake, alert, appropriate, MAE  CV:  s1s2 rrr, sys murmur  PULM: resps even non labored on [place], slightly diminished R base, otherwise ess clear  GI: abd soft, +bs  Extremities:  Warm and dry, no edema    IMPRESSION/ PLAN:  HCAP - improving, narrowed to levaquin alone and I agree with that.   Pleural effusion - in  setting volume overload r/t ESRD and severe aortic stenosis for which she is not a surgical candidate.  Only reason to thora at this point is if we are suspicious of an empyema which is unlikely given how well the patient appears, that would be shocking.  REC -  Agree with attempts at additional HD tomorrow per renal  F/u CXR  Cont  abx per primary for HCAP - narrowed to Levaquin 10/31, would treat for total abx days of 8 days, may change to PO whenever appropriate as bioavailability is the same. Pulmonary hygiene with IS and flutter valve. If no improvement with additional HD or worsens clinically consider thoracentesis, but hold off for now. Ambulatory desaturation study for ?need for O2 at home.  Danford Bad, NP 06/27/2012  1:09 PM Pager: [phone]  *Care during the described time interval was provided by me and/or other providers on the critical care team. I have reviewed this patient's available data, including medical history, events of note, physical examination and test results as part of my evaluation.  Patient seen and examined, agree with above note.  I dictated the care and orders written for this patient under my direction.  Koren Bound, M.D. [phone]

## 2012-06-27 NOTE — Progress Notes (Signed)
PT Cancellation Note  Patient Details Name: Miranda [name] MRN: [id number] DOB: [birth year]   Cancelled Treatment:    Reason Eval/Treat Not Completed: Patient at procedure or test/unavailable,  Pt in HD.  Will f/u tomorrow.     Sunny Schlein, [place] [phone] 06/27/2012, 10:34 AM

## 2012-06-27 NOTE — Progress Notes (Signed)
With 1 hr into hd tx, M. Bergman, PA ordered to challenge uf goal by 500 ml D/T x-ray showing some fluid. Will continue to closely and diligently monitor pt for any complications and s/s of hypotension.

## 2012-06-27 NOTE — Progress Notes (Signed)
I have seen and examined this patient and agree with plan as outlined by M. Doran Durand, PA-C.  Agree with extra HD for UF tomorrow.  Difficult to UF due to Aortic stenosis.  Unclear if she really did gain 2.6 liters over the last 48hrs.  Will increase time on HD and increase UF today. Miranda An A,MD 06/27/2012 9:47 AM

## 2012-06-27 NOTE — Progress Notes (Signed)
[name] Progress Note  Subjective:  Still using O2. No cough.  Tells me "they" are talking about recommending a different level of care.  States she is no weaker than usual.    Objective Filed Vitals:   06/27/12 0754 06/27/12 0800 06/27/12 0807 06/27/12 0830  BP: 169/64 169/64 166/60 158/57  Pulse: 87 86 81 79  Temp: 97.6 F (36.4 C)     TempSrc: Oral     Resp: 21 25 22 21   Height:      Weight: 59.7 kg (131 lb 9.8 oz)     SpO2: 96% 98% 100% 96%   Physical Exam:  Goal 2500 increased to 3000 General: NAD on HD Heart: RRR 3/6 murmur Lungs: decreased bases Abdomen: soft nontender Extremities: no significant edema, but supine. Dialysis Access: Right upper AVF maturing and right I-J [facility]   Dialysis Orders: Center: [facility] on TTS Optiflux 180 (kt/v's >2 could use 160)  EDW 58.5 HD Bath 2K 2.25 Ca Time 4 Heparin 4000 with 1000 mid treatment. Access right I-J and maturing right upper AVF BFR 400 DFR A 1.5 Zemplar 1 mcg IV/HD Epogen 6000 Units IV/HD Venofer None - s/p full course Venofer with last dose 10/24. Other Last tmt 10/26 had minimal wt gain and got to EDW bp 160- 170 sitting. Labs 10/24 Hgb 10.4 K 3.7 Fe 35 25 % sat Alb 3 Ca 9 P 3.8 iPTH 101   Assessment/Plan:  1. Pneumonia/Hypoxia - work up per primary ; on Vanc, cefepime zinacef; some/all of hypoxia could be volume related. She was NOT on O2 prior to admission.  Need to try to get her back to that level if at all possible. 2. 1 of 2 BC Gram + cocci bacteremia 10/28; BC x 2 10/23 negative- on empiric antibiotics; continue broad spectrum antibiotics for now 3. Severe AS - fairly stable with this for now; non surgical candidate; gentle lowering of volume. 4. ESRD - TTS per routine -Labs pending; AVF maturation procedure cancelled. I spoke with her son, Merlyn Albert yesterday and he wishes to reschedule as he is the one who takes her to these procedures. Labs pending 5. .Hypertension/volume - volume control only; no meds; UF  Tuesday 2.3 with post wt 57.1 and Bp stable except drop into 120s. CXR yesterday bilateral pleural effusions and increased vasc congestion.  Continue to challenge. Weight up 2.6 kg since last treatment; may need extra treatment for ultrafiltration Friday to lower EDW - cannon challenge large volume due to AS 6. Anemia - Hgb trending down; dose Ararnesp 100 x 1; ferritin when from 1807 in July to 3500 after a full course of Venofer 100 x 10; tsat increased from 19 to 25% Fe increased 30 to 35. CBC pending 7. Metabolic bone disease - Continue binders and zemplar - well controlled. 8. Nutrition - renal diet + supplements to augment protein and kcal; I suspect she is losing wt.  Sheffield Slider, PA-C [facility] Beeper [phone]  06/27/2012,8:47 AM  LOS: 3 days    Additional Objective Labs: Basic Metabolic Panel:  Lab 06/25/12 3086 06/24/12 1534  NA 135 133*  K 3.5 3.4*  CL 94* 94*  CO2 27 29  GLUCOSE 65* 102*  BUN 28* 23  CREATININE 3.39* 2.92*  CALCIUM 8.8 9.1  ALB -- --  PHOS -- --    Lab 06/25/12 0500 06/24/12 1534  WBC 14.2* 16.5*  NEUTROABS -- 14.3*  HGB 9.9* 10.6*  HCT 32.1* 33.7*  MCV 90.2 90.1  PLT 332 367   Blood Culture    Component Value Date/Time   SDES BLOOD HAND LEFT 06/24/2012 1648   SPECREQUEST BOTTLES DRAWN AEROBIC AND ANAEROBIC 10CC 06/24/2012 1648   CULT  Value: GRAM POSITIVE COCCI IN CLUSTERS Note: Gram Stain Report Called to,Read Back By and Verified With:  SYBIL Kathlene November  RN AT 1946 ON 06/25/2012 BY RHYNA 06/24/2012 1648   REPTSTATUS PENDING 06/24/2012 1648   Studies/Results: Dg Chest 2 View  06/26/2012  *RADIOLOGY REPORT*  Clinical Data: Preop evaluation for pneumonia.  CHEST - 2 VIEW  Comparison: Chest x-ray 06/26/2012.  Findings: Right internal jugular PermCath in position with the tips terminating in the distal superior vena cava and superior aspect of right atrium.  Lung volumes are normal.  Bibasilar opacities may represent  areas of atelectasis and/or consolidation, with superimposed bilateral pleural effusions (moderate on the right and small on the left).  Pulmonary vascular crowding, without frank pulmonary edema.  Heart size is mildly enlarged (unchanged). The patient is rotated to the left on today's exam, resulting in distortion of the mediastinal contours and reduced diagnostic sensitivity and specificity for mediastinal pathology. Atherosclerosis in the thoracic aorta.  Extensive mitral annular calcifications.  IMPRESSION: 1.  Cardiomegaly with pulmonary venous congestion, but no frank pulmonary edema. 2.  Bibasilar atelectasis and/or consolidation with superimposed moderate right and small left-sided pleural effusions.   Original Report Authenticated By: Florencia Reasons, M.D.    Dg Chest Port 1 View  06/26/2012  *RADIOLOGY REPORT*  Clinical Data: 78 year old female shortness of breath.  PORTABLE CHEST - 1 VIEW  Comparison: 06/24/2012 and earlier.  Findings: Seated AP portable view at 1006 hours.  Stable right IJ approach dual lumen dialysis type catheter.  Increase veiling opacity in the lower lungs compatible with pleural effusions. Associated dense retrocardiac and lung base opacity.  Increased pulmonary vascular congestion.  No pneumothorax.  Stable cardiac size and mediastinal contours.  IMPRESSION: Increased pleural effusions and pulmonary vascularity most suggestive of volume overload/interstitial edema.  Associated bibasilar collapse or consolidation.   Original Report Authenticated By: Harley Hallmark, M.D.    Medications:      . allopurinol  100 mg Oral BID  . antiseptic oral rinse  15 mL Mouth Rinse BID  . aspirin EC  81 mg Oral Daily  . atorvastatin  40 mg Oral Daily  . calcium carbonate  1,250 mg Oral BID WC  . ceFEPime (MAXIPIME) IV  1 g Intravenous Q24H  . cefUROXime (ZINACEF)  IV  1.5 g Intravenous 60 min Pre-Op  . darbepoetin (ARANESP) injection - DIALYSIS  100 mcg Intravenous Q Tue-HD  .  enoxaparin (LOVENOX) injection  30 mg Subcutaneous Q24H  . feeding supplement (NEPRO CARB STEADY)  237 mL Oral BID BM  . pantoprazole  40 mg Oral Daily  . paricalcitol      . paricalcitol  1 mcg Intravenous 3 times weekly  . predniSONE  5 mg Oral Daily  . sertraline  50 mg Oral Daily  . sodium chloride  3 mL Intravenous Q12H  . sodium chloride  3 mL Intravenous Q12H  . vancomycin  500 mg Intravenous Q T,Th,Sa-HD

## 2012-06-27 NOTE — Progress Notes (Signed)
Pt had short 20-30 second run of SVT, hx of same. MD aware. Pt asymptomatic, resting in bed at the time, pts only complaint is chronic knee pain bilaterally. Will continue to monitor.

## 2012-06-27 NOTE — Evaluation (Signed)
Pt O2 sats checked on RA. Sats = 87% on RA at rest. Pt reinitiated on o2 at 2l/m via n/c and sats return to 94% at rest with no signs and symptoms of distress. Dondra Spry

## 2012-06-27 NOTE — Progress Notes (Addendum)
Subjective: Patient pleasant, seen on dialysis, no apparent distress. Physical therapy and occupational therapy note reviewed in regards to recommending skilled nursing facility. Patient's son has been updated in detail this morning (Dr Lovie Macadamia). Case discussed with nephrology as well in regards to pleural effusion, plans are for  extra dialysis to attempt to  remove fluid. In regards to  Patient's blood culture of coag-negative staph, one of 2, probable contaminant, antibiotics will be streamline.  Objective: Vital signs in last 24 hours: Temp:  [97.6 F (36.4 C)-98.4 F (36.9 C)] 97.7 F (36.5 C) (10/31 1130) Pulse Rate:  [68-87] 78  (10/31 1130) Resp:  [16-27] 24  (10/31 1130) BP: (123-169)/(50-70) 123/50 mmHg (10/31 1130) SpO2:  [93 %-100 %] 99 % (10/31 1130) Weight:  [57.6 kg (126 lb 15.8 oz)-59.7 kg (131 lb 9.8 oz)] 57.6 kg (126 lb 15.8 oz) (10/31 1130) Weight change: -0.4 kg (-14.1 oz) Last BM Date: 06/24/12  Intake/Output from previous day: 10/30 0701 - 10/31 0700 In: 530 [P.O.:480; IV Piggyback:50] Out: 0  Intake/Output this shift: Total I/O In: 0  Out: 2400 [Other:2400]  General appearance: alert and cooperative Resp: clear to auscultation anteriorly, patient at dialysis unable to examine posteriorly Cardio: regular rate and rhythm, systolic murmur Extremities: extremities normal, atraumatic, no cyanosis or edema  Lab Results:  Results for orders placed during the hospital encounter of 06/24/12 (from the past 24 hour(s))  CBC     Status: Abnormal   Collection Time   06/27/12  8:30 AM      Component Value Range   WBC 14.7 (*) 4.0 - 10.5 K/uL   RBC 3.54 (*) 3.87 - 5.11 MIL/uL   Hemoglobin 9.8 (*) 12.0 - 15.0 g/dL   HCT 40.9 (*) 81.1 - 91.4 %   MCV 89.3  78.0 - 100.0 fL   MCH 27.7  26.0 - 34.0 pg   MCHC 31.0  30.0 - 36.0 g/dL   RDW 78.2 (*) 95.6 - 21.3 %   Platelets 355  150 - 400 K/uL  RENAL FUNCTION PANEL     Status: Abnormal   Collection Time   06/27/12  8:30  AM      Component Value Range   Sodium 132 (*) 135 - 145 mEq/L   Potassium 3.5  3.5 - 5.1 mEq/L   Chloride 94 (*) 96 - 112 mEq/L   CO2 27  19 - 32 mEq/L   Glucose, Bld 75  70 - 99 mg/dL   BUN 29 (*) 6 - 23 mg/dL   Creatinine, Ser 0.86 (*) 0.50 - 1.10 mg/dL   Calcium 8.9  8.4 - 57.8 mg/dL   Phosphorus 3.6  2.3 - 4.6 mg/dL   Albumin 1.9 (*) 3.5 - 5.2 g/dL   GFR calc non Af Amer 12 (*) >90 mL/min   GFR calc Af Amer 14 (*) >90 mL/min      Studies/Results: Dg Chest 2 View  06/26/2012  *RADIOLOGY REPORT*  Clinical Data: Preop evaluation for pneumonia.  CHEST - 2 VIEW  Comparison: Chest x-ray 06/26/2012.  Findings: Right internal jugular PermCath in position with the tips terminating in the distal superior vena cava and superior aspect of right atrium.  Lung volumes are normal.  Bibasilar opacities may represent areas of atelectasis and/or consolidation, with superimposed bilateral pleural effusions (moderate on the right and small on the left).  Pulmonary vascular crowding, without frank pulmonary edema.  Heart size is mildly enlarged (unchanged). The patient is rotated to the left on today's exam,  resulting in distortion of the mediastinal contours and reduced diagnostic sensitivity and specificity for mediastinal pathology. Atherosclerosis in the thoracic aorta.  Extensive mitral annular calcifications.  IMPRESSION: 1.  Cardiomegaly with pulmonary venous congestion, but no frank pulmonary edema. 2.  Bibasilar atelectasis and/or consolidation with superimposed moderate right and small left-sided pleural effusions.   Original Report Authenticated By: Florencia Reasons, M.D.    Dg Chest Port 1 View  06/26/2012  *RADIOLOGY REPORT*  Clinical Data: 78 year old female shortness of breath.  PORTABLE CHEST - 1 VIEW  Comparison: 06/24/2012 and earlier.  Findings: Seated AP portable view at 1006 hours.  Stable right IJ approach dual lumen dialysis type catheter.  Increase veiling opacity in the lower  lungs compatible with pleural effusions. Associated dense retrocardiac and lung base opacity.  Increased pulmonary vascular congestion.  No pneumothorax.  Stable cardiac size and mediastinal contours.  IMPRESSION: Increased pleural effusions and pulmonary vascularity most suggestive of volume overload/interstitial edema.  Associated bibasilar collapse or consolidation.   Original Report Authenticated By: Harley Hallmark, M.D.     Medications:  Prior to Admission:  Prescriptions prior to admission  Medication Sig Dispense Refill  . acetaminophen (TYLENOL) 500 MG tablet Take 500 mg by mouth every 6 (six) hours as needed. For pain      . allopurinol (ZYLOPRIM) 100 MG tablet Take 100 mg by mouth 2 (two) times daily.      Marland Kitchen aspirin EC 81 MG tablet Take 81 mg by mouth daily.      Marland Kitchen atorvastatin (LIPITOR) 40 MG tablet Take 40 mg by mouth daily.       . calcium carbonate (OS-CAL) 600 MG TABS Take 600 mg by mouth 2 (two) times daily with a meal.       . HYDROcodone-acetaminophen (VICODIN) 5-500 MG per tablet Take 0.5 tablets by mouth every 6 (six) hours as needed. For pain      . Multiple Vitamins-Minerals (CERTAVITE SENIOR/ANTIOXIDANT PO) Take by mouth daily.      . pantoprazole (PROTONIX) 40 MG tablet Take 40 mg by mouth daily.       . predniSONE (DELTASONE) 5 MG tablet Take 5 mg by mouth daily.      . sertraline (ZOLOFT) 50 MG tablet Take 50 mg by mouth daily.        Scheduled:   . acetaminophen      . allopurinol  100 mg Oral BID  . antiseptic oral rinse  15 mL Mouth Rinse BID  . aspirin EC  81 mg Oral Daily  . atorvastatin  40 mg Oral Daily  . calcium carbonate  1,250 mg Oral BID WC  . ceFEPime (MAXIPIME) IV  1 g Intravenous Q24H  . cefUROXime (ZINACEF)  IV  1.5 g Intravenous 60 min Pre-Op  . darbepoetin (ARANESP) injection - DIALYSIS  100 mcg Intravenous Q Tue-HD  . enoxaparin (LOVENOX) injection  30 mg Subcutaneous Q24H  . feeding supplement (NEPRO CARB STEADY)  237 mL Oral BID BM  .  pantoprazole  40 mg Oral Daily  . paricalcitol      . paricalcitol  1 mcg Intravenous 3 times weekly  . predniSONE  5 mg Oral Daily  . sertraline  50 mg Oral Daily  . sodium chloride  3 mL Intravenous Q12H  . sodium chloride  3 mL Intravenous Q12H  . vancomycin  250 mg Intravenous Q Fri-HD  . vancomycin  500 mg Intravenous Q T,Th,Sa-HD   Continuous:   Assessment/Plan: Pneumonia, healthcare associated  pneumonia. Patient clinically stable, Antibiotics will be streamlined, in regards to her pleural effusion she is scheduled for additional dialysis. We'll obtain followup x-ray. Hopefully thoracentesis not indicated. Positive blood culture one of 2, coag negative staph probable contaminant End-stage renal disease Leukocytosis, Apparently has been chronic, patient steroid could be contributing to this as well Gout. Patient without any complaints of knee pain today Aortic stenosis severe, may limit ability to pull fluid. Last echo 02/2012 showed severe as and preserved ef Disposition, plans for skilled nursing facility  LOS: 3 days   Kabrina Christiano D 06/27/2012, 12:12 PM

## 2012-06-28 ENCOUNTER — Inpatient Hospital Stay (HOSPITAL_COMMUNITY): Payer: Medicare Other

## 2012-06-28 LAB — STREP PNEUMONIAE URINARY ANTIGEN: Strep Pneumo Urinary Antigen: NEGATIVE

## 2012-06-28 MED ORDER — RENA-VITE PO TABS
1.0000 | ORAL_TABLET | Freq: Every day | ORAL | Status: DC
  Administered 2012-06-28 – 2012-07-03 (×6): 1 via ORAL
  Filled 2012-06-28 (×7): qty 1

## 2012-06-28 MED ORDER — HEPARIN SODIUM (PORCINE) 1000 UNIT/ML DIALYSIS
20.0000 [IU]/kg | INTRAMUSCULAR | Status: DC | PRN
  Administered 2012-06-28: 1200 [IU] via INTRAVENOUS_CENTRAL
  Filled 2012-06-28: qty 2

## 2012-06-28 NOTE — Progress Notes (Signed)
[facility] Progress Note  Subjective:  No new issues. Dr. Pollyann Kennedy visiting.  Objective Filed Vitals:   06/27/12 1416 06/27/12 1756 06/27/12 2242 06/28/12 0541  BP: 92/37 123/52 135/58 135/64  Pulse: 84 84 87 79  Temp: 98.2 F (36.8 C) 97.4 F (36.3 C) 98.8 F (37.1 C) 97.5 F (36.4 C)  TempSrc: Oral Oral Oral Oral  Resp: 19 19 17 16   Height:      Weight:   59.24 kg (130 lb 9.6 oz)   SpO2: 96% 96% 95% 98%   Physical Exam General: sitting in chair NAD wearing O2.  Heart: RRR 3/6 murmur Lungs: decreased base 1/3 with few crackles Abdomen: soft Extremities: no signific edema Dialysis Access: Right upper AVF maturing -patent and right I-J [facility]   Dialysis Orders: Center: [facility] on TTS Optiflux 180 (kt/v's >2 could use 160)  EDW 58.5 HD Bath 2K 2.25 Ca Time 4 Heparin 4000 with 1000 mid treatment. Access right I-J and maturing right upper AVF BFR 400 DFR A 1.5 Zemplar 1 mcg IV/HD Epogen 6000 Units IV/HD Venofer None - s/p full course Venofer with last dose 10/24. Labs 10/24 Hgb 10.4 K 3.7 Fe 35 25 % sat Alb 3 Ca 9 P 3.8 iPTH 101   Assessment/Plan:  1. Pneumonia/Hypoxia - changed to po levaquin - pul rec 8 day total;; some/all of hypoxia could be volume related. She was NOT on O2 prior to admission.  2. 1 of 2 BC Gram coag neg staph 10/28; BC x 2 10/23 negative-possible contaminant; changed to levaquin 3. Severe AS - fairly stable with this for now; non surgical candidate; gentle lowering of volume. 4. ESRD - TTS per routine - AVF maturation procedure cancelled. I spoke with her son, Merlyn Albert yesterday and he wishes to reschedule as he is the one who takes her to these procedures. Extra tmt today for HD/UF goal 3 liters; Back on routine schedule Saturday. 5. .Hypertension/volume - volume control only; no meds; UF Tuesday 2.3 with post wt 57.1 Tursday UF 2.4 with post wt 57.6 Extra treatment for ultrafiltration Friday to lower EDW - cannon challenge large volume due to AS 6. Anemia  - Hgb trending down; dose Ararnesp 100 x 1; ferritin when from 1807 in July to 3500 after a full course of Venofer 100 x 10; tsat increased from 19 to 25% Fe increased 30 to 35. Hgb stable high 9s. 7. Metabolic bone disease - Continue binders and zemplar - well controlled. 8. Nutrition - renal diet + supplements to augment protein and kcal; I suspect she is losing wt. Alb low at 1.9  Sheffield Slider, PA-C [facility] Beeper [phone]  06/28/2012,9:20 AM  LOS: 4 days   Additional Objective Labs: Basic Metabolic Panel:  Lab 06/27/12 9147 06/25/12 0500 06/24/12 1534  NA 132* 135 133*  K 3.5 3.5 3.4*  CL 94* 94* 94*  CO2 27 27 29   GLUCOSE 75 65* 102*  BUN 29* 28* 23  CREATININE 3.09* 3.39* 2.92*  CALCIUM 8.9 8.8 9.1  ALB -- -- --  PHOS 3.6 -- --   Liver Function Tests:  Lab 06/27/12 0830  AST --  ALT --  ALKPHOS --  BILITOT --  PROT --  ALBUMIN 1.9*   CBC:  Lab 06/27/12 0830 06/25/12 0500 06/24/12 1534  WBC 14.7* 14.2* 16.5*  NEUTROABS -- -- 14.3*  HGB 9.8* 9.9* 10.6*  HCT 31.6* 32.1* 33.7*  MCV 89.3 90.2 90.1  PLT 355 332 367  Blood Culture    Component Value Date/Time   SDES BLOOD HAND LEFT 06/24/2012 1648   SPECREQUEST BOTTLES DRAWN AEROBIC AND ANAEROBIC 10CC 06/24/2012 1648   CULT  Value: STAPHYLOCOCCUS SPECIES (COAGULASE NEGATIVE) Note: THE SIGNIFICANCE OF ISOLATING THIS ORGANISM FROM A SINGLE SET OF BLOOD CULTURES WHEN MULTIPLE SETS ARE DRAWN IS UNCERTAIN. PLEASE NOTIFY THE MICROBIOLOGY DEPARTMENT WITHIN ONE WEEK IF SPECIATION AND SENSITIVITIES ARE REQUIRED. Note: Gram Stain Report Called to,Read Back By and Verified With:  SYBIL Kathlene November  RN AT 1946 ON 06/25/2012 BY RHYNA 06/24/2012 1648   REPTSTATUS 06/27/2012 FINAL 06/24/2012 1648    Cardiac Enzymes: No results found for this basename: CKTOTAL:5,CKMB:5,CKMBINDEX:5,TROPONINI:5 in the last 168 hours CBG: No results found for this basename: GLUCAP:5 in the last 168 hours Iron Studies: No  results found for this basename: IRON,TIBC,TRANSFERRIN,FERRITIN in the last 72 hours @lablastinr3 @ Studies/Results: Dg Chest 2 View  06/26/2012  *RADIOLOGY REPORT*  Clinical Data: Preop evaluation for pneumonia.  CHEST - 2 VIEW  Comparison: Chest x-ray 06/26/2012.  Findings: Right internal jugular PermCath in position with the tips terminating in the distal superior vena cava and superior aspect of right atrium.  Lung volumes are normal.  Bibasilar opacities may represent areas of atelectasis and/or consolidation, with superimposed bilateral pleural effusions (moderate on the right and small on the left).  Pulmonary vascular crowding, without frank pulmonary edema.  Heart size is mildly enlarged (unchanged). The patient is rotated to the left on today's exam, resulting in distortion of the mediastinal contours and reduced diagnostic sensitivity and specificity for mediastinal pathology. Atherosclerosis in the thoracic aorta.  Extensive mitral annular calcifications.  IMPRESSION: 1.  Cardiomegaly with pulmonary venous congestion, but no frank pulmonary edema. 2.  Bibasilar atelectasis and/or consolidation with superimposed moderate right and small left-sided pleural effusions.   Original Report Authenticated By: Florencia Reasons, M.D.    Dg Chest Port 1 View  06/26/2012  *RADIOLOGY REPORT*  Clinical Data: 78 year old female shortness of breath.  PORTABLE CHEST - 1 VIEW  Comparison: 06/24/2012 and earlier.  Findings: Seated AP portable view at 1006 hours.  Stable right IJ approach dual lumen dialysis type catheter.  Increase veiling opacity in the lower lungs compatible with pleural effusions. Associated dense retrocardiac and lung base opacity.  Increased pulmonary vascular congestion.  No pneumothorax.  Stable cardiac size and mediastinal contours.  IMPRESSION: Increased pleural effusions and pulmonary vascularity most suggestive of volume overload/interstitial edema.  Associated bibasilar collapse or  consolidation.   Original Report Authenticated By: Harley Hallmark, M.D.    Medications:      . acetaminophen      . allopurinol  100 mg Oral BID  . antiseptic oral rinse  15 mL Mouth Rinse BID  . aspirin EC  81 mg Oral Daily  . atorvastatin  40 mg Oral Daily  . calcium carbonate  1,250 mg Oral BID WC  . darbepoetin (ARANESP) injection - DIALYSIS  100 mcg Intravenous Q Tue-HD  . enoxaparin (LOVENOX) injection  30 mg Subcutaneous Q24H  . feeding supplement (NEPRO CARB STEADY)  237 mL Oral BID BM  . levofloxacin  500 mg Oral QODAY  . levofloxacin  750 mg Oral Once  . pantoprazole  40 mg Oral Daily  . paricalcitol  1 mcg Intravenous 3 times weekly  . predniSONE  5 mg Oral Daily  . sertraline  50 mg Oral Daily  . sodium chloride  3 mL Intravenous Q12H  . sodium chloride  3 mL Intravenous Q12H

## 2012-06-28 NOTE — Consult Note (Signed)
Patient: Miranda [name] DOB: 26-Sep-1921 Date of Admission: 06/24/2012            Pulmonary consult  Date of Consult: 06/28/2012 MD requesting consult:  Polite  Reason for consult:  PNA, Pleural effusion   HPI -  78yo female with hx ESRD, HTN, severe aortic stenosis (not surgical candidate) presented 10/28 with SOB, cough, fever and BLE edema.  She was found to have RLL infiltrate and was admitted with HCAP.   She was treated with abx but cont to have difficulty with volume overload in the setting of ESRD and severe aortic stenosis.  CXR on 10/31 showed mod pleural effusion and PCCM consulted for further recs.    Filed Vitals:   06/27/12 1416 06/27/12 1756 06/27/12 2242 06/28/12 0541  BP: 92/37 123/52 135/58 135/64  Pulse: 84 84 87 79  Temp: 98.2 F (36.8 C) 97.4 F (36.3 C) 98.8 F (37.1 C) 97.5 F (36.4 C)  TempSrc: Oral Oral Oral Oral  Resp: 19 19 17 16   Height:      Weight:   59.24 kg (130 lb 9.6 oz)   SpO2: 96% 96% 95% 98%   chest X-ray Dg Chest 2 View  06/26/2012  *RADIOLOGY REPORT*  Clinical Data: Preop evaluation for pneumonia.  CHEST - 2 VIEW  Comparison: Chest x-ray 06/26/2012.  Findings: Right internal jugular PermCath in position with the tips terminating in the distal superior vena cava and superior aspect of right atrium.  Lung volumes are normal.  Bibasilar opacities may represent areas of atelectasis and/or consolidation, with superimposed bilateral pleural effusions (moderate on the right and small on the left).  Pulmonary vascular crowding, without frank pulmonary edema.  Heart size is mildly enlarged (unchanged). The patient is rotated to the left on today's exam, resulting in distortion of the mediastinal contours and reduced diagnostic sensitivity and specificity for mediastinal pathology. Atherosclerosis in the thoracic aorta.  Extensive mitral annular calcifications.  IMPRESSION: 1.  Cardiomegaly with pulmonary venous congestion, but no frank pulmonary edema. 2.   Bibasilar atelectasis and/or consolidation with superimposed moderate right and small left-sided pleural effusions.   Original Report Authenticated By: Florencia Reasons, M.D.    CBC  Lab 06/27/12 0830 06/25/12 0500 06/24/12 1534  HGB 9.8* 9.9* 10.6*  HCT 31.6* 32.1* 33.7*  WBC 14.7* 14.2* 16.5*  PLT 355 332 367   BMET  Lab 06/27/12 0830 06/25/12 0500 06/24/12 1534  NA 132* 135 133*  K 3.5 3.5 --  CL 94* 94* 94*  CO2 27 27 29   GLUCOSE 75 65* 102*  BUN 29* 28* 23  CREATININE 3.09* 3.39* 2.92*  CALCIUM 8.9 8.8 9.1  MG -- -- --  PHOS 3.6 -- --   EXAM: General: pleasant, chronically ill appearing female, NAD in bed eating lunch  Neuro: awake, alert, appropriate, MAE  CV:  s1s2 rrr, sys murmur  PULM: resps even non labored on , slightly diminished R base, otherwise ess clear  GI: abd soft, +bs  Extremities:  Warm and dry, no edema   IMPRESSION/ PLAN:  HCAP - improving, narrowed to levaquin alone and I agree with that.   Pleural effusion - in setting volume overload r/t ESRD and severe aortic stenosis for which she is not a surgical candidate.  Only reason to thora at this point is if we are suspicious of an empyema which is unlikely given how well the patient appears, that would be shocking.  REC -  - Continue HD with fluid negative to  address the pleural effusion.  - CXR ordered for AM and will f/u on Monday. - Cont abx per primary for HCAP - narrowed to Levaquin 10/31, would treat for total abx days of 8 days, may change to PO whenever appropriate as bioavailability is the same. - Pulmonary hygiene with IS and flutter valve. - If no improvement with additional HD or worsens clinically consider thoracentesis, but hold off for now. - Ambulatory desaturation study not necessary, will need home O2, sat of 87% on RA, recommend 2L [place] upon discharge.  PCCM will see again on Monday if patient is still here, if not please make F/U appointment with [facility].  Alyson Reedy,  M.D. [facility] Pulmonary/Critical Care Medicine. Pager: [phone]. After hours pager: [phone].

## 2012-06-28 NOTE — Progress Notes (Signed)
I have seen and examined this patient and agree with plan as outlined by M. Doran Durand, PA-C.  For extra tx to help with fluid although with tight AS, will be difficult to remove.  Agree with primary svc that thoracentesis would be risky and would not pursue as she is stable from respiratory standpoint.  Patient was seen on dialysis and the procedure was supervised. BFR 300 Via AVF BP is 128/60.  Patient appears to be tolerating treatment well.   Jarod Bozzo A,MD 06/28/2012 2:29 PM

## 2012-06-28 NOTE — Progress Notes (Signed)
Physical Therapy Treatment Patient Details Name: Miranda [name] MRN: [id number] DOB: 30-Apr-1922 Today's Date: 06/28/2012 Time: 4098-1191 PT Time Calculation (min): 24 min  PT Assessment / Plan / Recommendation Comments on Treatment Session  Pt. continues with generalized weakness from acute illness and hospitalization, though has great motivation to do all she can.    Follow Up Recommendations  Post acute inpatient     Does the patient have the potential to tolerate intense rehabilitation  No, Recommend SNF  Barriers to Discharge        Equipment Recommendations  3 in 1 bedside comode    Recommendations for Other Services    Frequency Min 3X/week   Plan Discharge plan remains appropriate    Precautions / Restrictions Precautions Precautions: Fall Restrictions Weight Bearing Restrictions: No Other Position/Activity Restrictions: Desats with acitivity   Pertinent Vitals/Pain No pain, no distress    Mobility  Bed Mobility Bed Mobility: Not assessed (pt. already seated in recliner chair) Transfers Transfers: Sit to Stand;Stand to Sit Sit to Stand: 3: Mod assist;From chair/3-in-1;With upper extremity assist Stand to Sit: 3: Mod assist;To chair/3-in-1;With upper extremity assist Details for Transfer Assistance: poorly controlled descent, able to turn briefly to look out window to see fall colors Ambulation/Gait Ambulation/Gait Assistance: 4: Min assist Ambulation Distance (Feet): 10 Feet Assistive device: Rolling walker Ambulation/Gait Assistance Details: Pt. took about 20 marching steps in place, equating to nearly 10 feet in distance.  Limited by overall weakness    Exercises General Exercises - Lower Extremity Ankle Circles/Pumps: AROM;Both;10 reps;Seated Long Arc Quad: AROM;Both;15 reps;Seated Hip ABduction/ADduction: Strengthening;Both;10 reps;Seated Hip Flexion/Marching: AROM;Both;20 reps;Seated   PT Diagnosis:    PT Problem List:   PT Treatment Interventions:      PT Goals Acute Rehab PT Goals PT Goal: Sit to Stand - Progress: Progressing toward goal PT Goal: Stand to Sit - Progress: Progressing toward goal PT Goal: Ambulate - Progress: Progressing toward goal PT Goal: Perform Home Exercise Program - Progress: Progressing toward goal  Visit Information  Last PT Received On: 06/28/12 Assistance Needed: +1    Subjective Data  Subjective: I guess IO feel OK, a little better   Cognition  Overall Cognitive Status: Appears within functional limits for tasks assessed/performed Area of Impairment: Memory Arousal/Alertness: Awake/alert Orientation Level: Appears intact for tasks assessed Behavior During Session: [facility] for tasks performed Memory Deficits: decreased STM    Balance     End of Session PT - End of Session Equipment Utilized During Treatment: Oxygen Activity Tolerance: Patient limited by fatigue Patient left: in chair;with call bell/phone within reach Nurse Communication: Mobility status   GP     Ferman Hamming 06/28/2012, 10:10 AM Weldon Picking PT Acute Rehab Services [phone] Beeper [phone]

## 2012-06-28 NOTE — Clinical Social Work Psychosocial (Signed)
Clinical Social Work Department BRIEF PSYCHOSOCIAL ASSESSMENT 06/28/2012  Patient:  Miranda [name], Miranda [name]     Account Number:  [id number]     Admit date:  06/24/2012  Clinical Social Worker:  Delmer Islam  Date/Time:  06/28/2012 08:27 AM  Referred by:  Physician  Date Referred:  06/27/2012 Referred for  SNF Placement   Other Referral:   Interview type:  Other - See comment Other interview type:   CSW talked with SW staff at [facility]    PSYCHOSOCIAL DATA Living Status:  ALONE Admitted from facility:   Level of care:  Independent Living Primary support name:   Primary support relationship to patient:  SPOUSE Degree of support available:   Patient is a resident at [facility] in Independent Living. She lives with her spouse    CURRENT CONCERNS Current Concerns  Post-Acute Placement   Other Concerns:    SOCIAL WORK ASSESSMENT / PLAN CSW talked with admissions staff at [facility] on 10/31 and 11/1 regarding a bed for patient in their healthcare facility as PT recommending SNF prior to return home. CSW advised that they do have a bed for patient.   Assessment/plan status:  Psychosocial Support/Ongoing Assessment of Needs Other assessment/ plan:   Information/referral to community resources:    PATIENT'S/FAMILY'S RESPONSE TO PLAN OF CARE: CSW will follow-up with patient/family prior to discharge

## 2012-06-28 NOTE — Progress Notes (Signed)
Utilization review completed.  

## 2012-06-28 NOTE — Progress Notes (Signed)
Subjective: Patient doing well without complaints without fever chills chest pain, she still is on oxygen.  Objective: Vital signs in last 24 hours: Temp:  [97.4 F (36.3 C)-98.8 F (37.1 C)] 97.5 F (36.4 C) (11/01 0541) Pulse Rate:  [79-87] 79  (11/01 0541) Resp:  [16-19] 16  (11/01 0541) BP: (92-135)/(37-64) 135/64 mmHg (11/01 0541) SpO2:  [95 %-98 %] 98 % (11/01 0541) Weight:  [59.24 kg (130 lb 9.6 oz)] 59.24 kg (130 lb 9.6 oz) (10/31 2242) Weight change: 0.6 kg (1 lb 5.2 oz) Last BM Date: 06/27/12  Intake/Output from previous day: 10/31 0701 - 11/01 0700 In: 603 [P.O.:600; I.V.:3] Out: 2404 [Urine:2; Stool:2] Intake/Output this shift: Total I/O In: 243 [P.O.:240; I.V.:3] Out: -   General appearance: alert and cooperative Resp: decreased breath sounds bibasilar Cardio: regular rate, rhythm systolic murmur second right intercostal space Extremities: extremities normal, atraumatic, no cyanosis or edema  Lab Results:  No results found for this or any previous visit (from the past 24 hour(s)).    Studies/Results: Dg Chest 2 View  06/26/2012  *RADIOLOGY REPORT*  Clinical Data: Preop evaluation for pneumonia.  CHEST - 2 VIEW  Comparison: Chest x-ray 06/26/2012.  Findings: Right internal jugular PermCath in position with the tips terminating in the distal superior vena cava and superior aspect of right atrium.  Lung volumes are normal.  Bibasilar opacities may represent areas of atelectasis and/or consolidation, with superimposed bilateral pleural effusions (moderate on the right and small on the left).  Pulmonary vascular crowding, without frank pulmonary edema.  Heart size is mildly enlarged (unchanged). The patient is rotated to the left on today's exam, resulting in distortion of the mediastinal contours and reduced diagnostic sensitivity and specificity for mediastinal pathology. Atherosclerosis in the thoracic aorta.  Extensive mitral annular calcifications.  IMPRESSION: 1.   Cardiomegaly with pulmonary venous congestion, but no frank pulmonary edema. 2.  Bibasilar atelectasis and/or consolidation with superimposed moderate right and small left-sided pleural effusions.   Original Report Authenticated By: Florencia Reasons, M.D.     Medications:  Prior to Admission:  Prescriptions prior to admission  Medication Sig Dispense Refill  . acetaminophen (TYLENOL) 500 MG tablet Take 500 mg by mouth every 6 (six) hours as needed. For pain      . allopurinol (ZYLOPRIM) 100 MG tablet Take 100 mg by mouth 2 (two) times daily.      Marland Kitchen aspirin EC 81 MG tablet Take 81 mg by mouth daily.      Marland Kitchen atorvastatin (LIPITOR) 40 MG tablet Take 40 mg by mouth daily.       . calcium carbonate (OS-CAL) 600 MG TABS Take 600 mg by mouth 2 (two) times daily with a meal.       . HYDROcodone-acetaminophen (VICODIN) 5-500 MG per tablet Take 0.5 tablets by mouth every 6 (six) hours as needed. For pain      . Multiple Vitamins-Minerals (CERTAVITE SENIOR/ANTIOXIDANT PO) Take by mouth daily.      . pantoprazole (PROTONIX) 40 MG tablet Take 40 mg by mouth daily.       . predniSONE (DELTASONE) 5 MG tablet Take 5 mg by mouth daily.      . sertraline (ZOLOFT) 50 MG tablet Take 50 mg by mouth daily.        Scheduled:   . acetaminophen      . allopurinol  100 mg Oral BID  . antiseptic oral rinse  15 mL Mouth Rinse BID  . aspirin EC  81  mg Oral Daily  . atorvastatin  40 mg Oral Daily  . calcium carbonate  1,250 mg Oral BID WC  . darbepoetin (ARANESP) injection - DIALYSIS  100 mcg Intravenous Q Tue-HD  . enoxaparin (LOVENOX) injection  30 mg Subcutaneous Q24H  . feeding supplement (NEPRO CARB STEADY)  237 mL Oral BID BM  . levofloxacin  500 mg Oral QODAY  . levofloxacin  750 mg Oral Once  . multivitamin  1 tablet Oral QHS  . pantoprazole  40 mg Oral Daily  . paricalcitol  1 mcg Intravenous 3 times weekly  . predniSONE  5 mg Oral Daily  . sertraline  50 mg Oral Daily  . sodium chloride  3 mL  Intravenous Q12H  . sodium chloride  3 mL Intravenous Q12H   Continuous:   Assessment/Plan: Pneumonia, healthcare associated pneumonia. Patient clinically stable,  Dyspnea, hypoxia multifactorial pneumonia, rule out contribution from pleural effusion, hopefully able to diurese and fusion if not thoracentesis may be indicated for symptom relief. Obtain followup x-ray after dialysis  Positive blood culture one of 2, coag negative staph probable contaminant  End-stage renal disease  Leukocytosis, Apparently has been chronic, patient steroid could be contributing to this as well  Gout. Aortic stenosis severe, may limit ability to pull fluid. Last echo 02/2012 showed severe as and preserved ef  Disposition, plans for skilled nursing facility   LOS: 4 days   Lenon Kuennen D 06/28/2012, 12:27 PM

## 2012-06-29 ENCOUNTER — Inpatient Hospital Stay (HOSPITAL_COMMUNITY): Payer: Medicare Other

## 2012-06-29 LAB — RENAL FUNCTION PANEL
Albumin: 2 g/dL — ABNORMAL LOW (ref 3.5–5.2)
BUN: 25 mg/dL — ABNORMAL HIGH (ref 6–23)
CO2: 28 mEq/L (ref 19–32)
Calcium: 9.3 mg/dL (ref 8.4–10.5)
Chloride: 98 mEq/L (ref 96–112)
Creatinine, Ser: 2.45 mg/dL — ABNORMAL HIGH (ref 0.50–1.10)
GFR calc Af Amer: 19 mL/min — ABNORMAL LOW (ref >90)
GFR calc non Af Amer: 17 mL/min — ABNORMAL LOW (ref >90)
Glucose, Bld: 112 mg/dL — ABNORMAL HIGH (ref 70–99)
Phosphorus: 2.6 mg/dL (ref 2.3–4.6)
Potassium: 4.8 mEq/L (ref 3.5–5.1)
Sodium: 134 mEq/L — ABNORMAL LOW (ref 135–145)

## 2012-06-29 LAB — CBC
HCT: 35.2 % — ABNORMAL LOW (ref 36.0–46.0)
Hemoglobin: 11 g/dL — ABNORMAL LOW (ref 12.0–15.0)
MCH: 28.3 pg (ref 26.0–34.0)
MCHC: 31.3 g/dL (ref 30.0–36.0)
MCV: 90.5 fL (ref 78.0–100.0)
Platelets: 385 10*3/uL (ref 150–400)
RBC: 3.89 MIL/uL (ref 3.87–5.11)
RDW: 20.3 % — ABNORMAL HIGH (ref 11.5–15.5)
WBC: 13.1 10*3/uL — ABNORMAL HIGH (ref 4.0–10.5)

## 2012-06-29 MED ORDER — PARICALCITOL 5 MCG/ML IV SOLN
INTRAVENOUS | Status: AC
  Administered 2012-06-29: 1 ug via INTRAVENOUS
  Filled 2012-06-29: qty 1

## 2012-06-29 MED ORDER — ACETAMINOPHEN 325 MG PO TABS
ORAL_TABLET | ORAL | Status: AC
  Administered 2012-06-29: 650 mg via ORAL
  Filled 2012-06-29: qty 2

## 2012-06-29 NOTE — Progress Notes (Signed)
I have seen and examined this patient and agree with plan as outlined above.  May want to do HD again on Monday, although pt is asymptomatic.  Will continue to challenge EDW as BP tolerates. Lourie Retz A,MD 06/29/2012 11:07 AM

## 2012-06-29 NOTE — Progress Notes (Signed)
Subjective: She states she had a relatively good night, no dyspnea or cough  Objective: Vital signs in last 24 hours: Temp:  [97.8 F (36.6 C)-98.5 F (36.9 C)] 97.8 F (36.6 C) (11/01 2113) Pulse Rate:  [71-80] 80  (11/01 2113) Resp:  [15-26] 21  (11/01 2113) BP: (107-137)/(49-71) 116/49 mmHg (11/01 2113) SpO2:  [96 %-99 %] 96 % (11/01 2113) Weight:  [54.3 kg (119 lb 11.4 oz)-57.2 kg (126 lb 1.7 oz)] 54.477 kg (120 lb 1.6 oz) (11/01 2113) Weight change: -2.5 kg (-5 lb 8.2 oz) Last BM Date: 06/27/12  Intake/Output from previous day: 11/01 0701 - 11/02 0700 In: 483 [P.O.:480; I.V.:3] Out: 2900  Intake/Output this shift:    Resp: clear to auscultation bilaterally Cardio: systolic murmur: systolic ejection 3/6, crescendo at 2nd left intercostal space GI: soft, non-tender; bowel sounds normal; no masses,  no organomegaly  Lab Results:  Basename 06/27/12 0830  WBC 14.7*  HGB 9.8*  HCT 31.6*  PLT 355   BMET  Basename 06/27/12 0830  NA 132*  K 3.5  CL 94*  CO2 27  GLUCOSE 75  BUN 29*  CREATININE 3.09*  CALCIUM 8.9    Studies/Results: Dg Chest 2 View  06/28/2012  *RADIOLOGY REPORT*  Clinical Data: Follow-up pleural effusion.  CHEST - 2 VIEW  Comparison: 06/26/2012  Findings: Right dialysis catheter remains in place, unchanged. Improving cardiomegaly.  Continued venous congestion, moderate pleural effusions and bibasilar opacities, not significantly changed.  IMPRESSION: Improving cardiomegaly.  Bilateral effusions and lower lobe opacities are unchanged.   Original Report Authenticated By: Charlett Nose, M.D.    Dg Chest Port 1 View  06/29/2012  *RADIOLOGY REPORT*  Clinical Data: Pleural effusions, end-stage renal disease  PORTABLE CHEST - 1 VIEW  Comparison: 06/28/2012  Findings: Stable moderate bilateral pleural effusions with basilar atelectasis / consolidation as before.  Stable heart size and vascular congestion.  Right IJ dialysis catheter tips are in the SVC.   Atherosclerosis of the aorta.  IMPRESSION: Stable bilateral pleural effusions with basilar atelectasis / consolidation.  No interval change.   Original Report Authenticated By: Judie Petit. Miles Costain, M.D.     Medications:  Scheduled:   . allopurinol  100 mg Oral BID  . antiseptic oral rinse  15 mL Mouth Rinse BID  . aspirin EC  81 mg Oral Daily  . atorvastatin  40 mg Oral Daily  . calcium carbonate  1,250 mg Oral BID WC  . darbepoetin (ARANESP) injection - DIALYSIS  100 mcg Intravenous Q Tue-HD  . enoxaparin (LOVENOX) injection  30 mg Subcutaneous Q24H  . feeding supplement (NEPRO CARB STEADY)  237 mL Oral BID BM  . levofloxacin  500 mg Oral QODAY  . multivitamin  1 tablet Oral QHS  . pantoprazole  40 mg Oral Daily  . paricalcitol  1 mcg Intravenous 3 times weekly  . predniSONE  5 mg Oral Daily  . sertraline  50 mg Oral Daily  . sodium chloride  3 mL Intravenous Q12H  . sodium chloride  3 mL Intravenous Q12H    Assessment/Plan: 1. Pneumonia, now on po levaquin and clinically improving 2. Pleural effusion no change on port film will repeat pa/lat cxr Monday 3.ESRD doing well with dialysis  LOS: 5 days   [facility] 06/29/2012, 8:05 AM

## 2012-06-29 NOTE — Progress Notes (Signed)
[facility] Progress Note  Subjective:  Hopes she's making progress.  Objective Filed Vitals:   06/28/12 2113 06/29/12 0825 06/29/12 0839 06/29/12 0900  BP: 116/49 128/68 141/70 118/59  Pulse: 80 80 78 78  Temp: 97.8 F (36.6 C) 98.2 F (36.8 C)    TempSrc: Oral Oral    Resp: 21 22 19 20   Height: 5' (1.524 m)     Weight: 54.477 kg (120 lb 1.6 oz) 55.1 kg (121 lb 7.6 oz)    SpO2: 96% 100% 100% 97%   Physical Exam General: NAD Heart: RRR 3/6 murmur Lungs: decreased bases Abdomen: soft Extremities: no edema Dialysis Access: Right upper AVF maturing -patent and right I-J TDC Qb 400  Dialysis Orders: Center: [facility] on TTS Optiflux 180 (kt/v's >2 could use 160)  EDW 58.5 HD Bath 2K 2.25 Ca Time 4 Heparin 4000 with 1000 mid treatment. Access right I-J and maturing right upper AVF BFR 400 DFR A 1.5 Zemplar 1 mcg IV/HD Epogen 6000 Units IV/HD Venofer None - s/p full course Venofer with last dose 10/24. Labs 10/24 Hgb 10.4 K 3.7 Fe 35 25 % sat Alb 3 Ca 9 P 3.8 iPTH 101   Assessment/Plan:  1. Pneumonia/Hypoxia - changed to po levaquin - pul rec 8 day total;; some/all of hypoxia could be volume related. She was NOT on O2 prior to admission. Sats good now.  Check on room air post HD. 2. 1 of 2 BC Gram coag neg staph 10/28; BC x 2 10/23 negative-possible contaminant; changed to levaquin 3. Severe AS - fairly stable with this for now; non surgical candidate; gentle lowering of volume. 4. ESRD - TTS per routine - AVF maturation procedure cancelled. I spoke with her son, Merlyn Albert yesterday and he wishes to reschedule as he is the one who takes her to these procedures. Extra tmt today for HD/UF goal 3 liters; Back on routine schedule Saturday. 5. .Hypertension/volume - volume control only; no meds; UF Tuesday 2.3 with post wt 57.1 Tursday UF 2.4 with post wt 57.6, 2.9 with post wt 54.3. Continue to challenge.  No significant changed noted on cxr done this am. 6. Anemia - Hgb up to 11 -  inpart to to volume concentration I suspect.; dose Ararnesp 100 x 1; ferritin when from 1807 in July to 3500 after a full course of Venofer 100 x 10; tsat increased from 19 to 25% Fe increased 30 to 35. Hgb stable high 9s. 7. Metabolic bone disease - Continue binders and zemplar - well controlled. 8. Nutrition - renal diet + supplements to augment protein and kcal; I suspect she is losing wt. Alb low at 1.9   Sheffield Slider, PA-C [facility]per [phone]  06/29/2012,9:41 AM  LOS: 5 days    Additional Objective Labs: Basic Metabolic Panel:  Lab 06/27/12 4540 06/25/12 0500 06/24/12 1534  NA 132* 135 133*  K 3.5 3.5 3.4*  CL 94* 94* 94*  CO2 27 27 29   GLUCOSE 75 65* 102*  BUN 29* 28* 23  CREATININE 3.09* 3.39* 2.92*  CALCIUM 8.9 8.8 9.1  ALB -- -- --  PHOS 3.6 -- --   Liver Function Tests:  Lab 06/27/12 0830  AST --  ALT --  ALKPHOS --  BILITOT --  PROT --  ALBUMIN 1.9*   CBC:  Lab 06/29/12 0900 06/27/12 0830 06/25/12 0500 06/24/12 1534  WBC 13.1* 14.7* 14.2* --  NEUTROABS -- -- -- 14.3*  HGB 11.0* 9.8* 9.9* --  HCT 35.2* 31.6* 32.1* --  MCV 90.5 89.3 90.2 90.1  PLT 385 355 332 --   Blood Culture    Component Value Date/Time   SDES BLOOD HAND LEFT 06/24/2012 1648   SPECREQUEST BOTTLES DRAWN AEROBIC AND ANAEROBIC 10CC 06/24/2012 1648   CULT  Value: STAPHYLOCOCCUS SPECIES (COAGULASE NEGATIVE) Note: THE SIGNIFICANCE OF ISOLATING THIS ORGANISM FROM A SINGLE SET OF BLOOD CULTURES WHEN MULTIPLE SETS ARE DRAWN IS UNCERTAIN. PLEASE NOTIFY THE MICROBIOLOGY DEPARTMENT WITHIN ONE WEEK IF SPECIATION AND SENSITIVITIES ARE REQUIRED. Note: Gram Stain Report Called to,Read Back By and Verified With:  SYBIL [facility]  RN AT 1946 ON 06/25/2012 BY RHYNA 06/24/2012 1648   REPTSTATUS 06/27/2012 FINAL 06/24/2012 1648   Studies/Results: Dg Chest 2 View  06/28/2012  *RADIOLOGY REPORT*  Clinical Data: Follow-up pleural effusion.  CHEST - 2 VIEW  Comparison: 06/26/2012   Findings: Right dialysis catheter remains in place, unchanged. Improving cardiomegaly.  Continued venous congestion, moderate pleural effusions and bibasilar opacities, not significantly changed.  IMPRESSION: Improving cardiomegaly.  Bilateral effusions and lower lobe opacities are unchanged.   Original Report Authenticated By: Charlett Nose, M.D.    Dg Chest Port 1 View  06/29/2012  *RADIOLOGY REPORT*  Clinical Data: Pleural effusions, end-stage renal disease  PORTABLE CHEST - 1 VIEW  Comparison: 06/28/2012  Findings: Stable moderate bilateral pleural effusions with basilar atelectasis / consolidation as before.  Stable heart size and vascular congestion.  Right IJ dialysis catheter tips are in the SVC.  Atherosclerosis of the aorta.  IMPRESSION: Stable bilateral pleural effusions with basilar atelectasis / consolidation.  No interval change.   Original Report Authenticated By: Judie Petit. Miles Costain, M.D.    Medications:      . allopurinol  100 mg Oral BID  . antiseptic oral rinse  15 mL Mouth Rinse BID  . aspirin EC  81 mg Oral Daily  . atorvastatin  40 mg Oral Daily  . calcium carbonate  1,250 mg Oral BID WC  . darbepoetin (ARANESP) injection - DIALYSIS  100 mcg Intravenous Q Tue-HD  . enoxaparin (LOVENOX) injection  30 mg Subcutaneous Q24H  . feeding supplement (NEPRO CARB STEADY)  237 mL Oral BID BM  . levofloxacin  500 mg Oral QODAY  . multivitamin  1 tablet Oral QHS  . pantoprazole  40 mg Oral Daily  . paricalcitol  1 mcg Intravenous 3 times weekly  . predniSONE  5 mg Oral Daily  . sertraline  50 mg Oral Daily  . sodium chloride  3 mL Intravenous Q12H  . sodium chloride  3 mL Intravenous Q12H

## 2012-06-30 LAB — LEGIONELLA ANTIGEN, URINE: Legionella Antigen, Urine: NEGATIVE

## 2012-06-30 MED ORDER — HEPARIN SODIUM (PORCINE) 1000 UNIT/ML DIALYSIS
20.0000 [IU]/kg | INTRAMUSCULAR | Status: DC | PRN
  Administered 2012-07-01: 4200 [IU] via INTRAVENOUS_CENTRAL
  Filled 2012-06-30: qty 2

## 2012-06-30 NOTE — Progress Notes (Signed)
Subjective: Good night no dyspnea or cough  Objective: Vital signs in last 24 hours: Temp:  [97.3 F (36.3 C)-98.8 F (37.1 C)] 97.3 F (36.3 C) (11/03 0607) Pulse Rate:  [74-83] 74  (11/03 0607) Resp:  [15-24] 18  (11/03 0607) BP: (102-151)/(46-70) 151/55 mmHg (11/03 0607) SpO2:  [96 %-100 %] 99 % (11/03 0607) Weight:  [53.3 kg (117 lb 8.1 oz)-55.1 kg (121 lb 7.6 oz)] 54.6 kg (120 lb 5.9 oz) (11/02 2125) Weight change: -2.1 kg (-4 lb 10.1 oz) Last BM Date: 06/27/12  Intake/Output from previous day: 11/02 0701 - 11/03 0700 In: 360 [P.O.:360] Out: 1736  Intake/Output this shift:    Resp: decreased breath sounds left base no wheezes or rhonchi Cardio: regular rate and rhythm and systolic murmur: systolic ejection 3/6, crescendo at 2nd left intercostal space Extremities: no edema, redness or tenderness in the calves or thighs  Lab Results:  Basename 06/29/12 0900 06/27/12 0830  WBC 13.1* 14.7*  HGB 11.0* 9.8*  HCT 35.2* 31.6*  PLT 385 355   BMET  Basename 06/29/12 0900 06/27/12 0830  NA 134* 132*  K 4.8 3.5  CL 98 94*  CO2 28 27  GLUCOSE 112* 75  BUN 25* 29*  CREATININE 2.45* 3.09*  CALCIUM 9.3 8.9    Studies/Results: Dg Chest 2 View  06/28/2012  *RADIOLOGY REPORT*  Clinical Data: Follow-up pleural effusion.  CHEST - 2 VIEW  Comparison: 06/26/2012  Findings: Right dialysis catheter remains in place, unchanged. Improving cardiomegaly.  Continued venous congestion, moderate pleural effusions and bibasilar opacities, not significantly changed.  IMPRESSION: Improving cardiomegaly.  Bilateral effusions and lower lobe opacities are unchanged.   Original Report Authenticated By: Charlett Nose, M.D.    Dg Chest Port 1 View  06/29/2012  *RADIOLOGY REPORT*  Clinical Data: Pleural effusions, end-stage renal disease  PORTABLE CHEST - 1 VIEW  Comparison: 06/28/2012  Findings: Stable moderate bilateral pleural effusions with basilar atelectasis / consolidation as before.  Stable  heart size and vascular congestion.  Right IJ dialysis catheter tips are in the SVC.  Atherosclerosis of the aorta.  IMPRESSION: Stable bilateral pleural effusions with basilar atelectasis / consolidation.  No interval change.   Original Report Authenticated By: Judie Petit. Miles Costain, M.D.     Medications:  Scheduled:   . allopurinol  100 mg Oral BID  . antiseptic oral rinse  15 mL Mouth Rinse BID  . aspirin EC  81 mg Oral Daily  . atorvastatin  40 mg Oral Daily  . calcium carbonate  1,250 mg Oral BID WC  . darbepoetin (ARANESP) injection - DIALYSIS  100 mcg Intravenous Q Tue-HD  . enoxaparin (LOVENOX) injection  30 mg Subcutaneous Q24H  . feeding supplement (NEPRO CARB STEADY)  237 mL Oral BID BM  . levofloxacin  500 mg Oral QODAY  . multivitamin  1 tablet Oral QHS  . pantoprazole  40 mg Oral Daily  . paricalcitol  1 mcg Intravenous 3 times weekly  . predniSONE  5 mg Oral Daily  . sertraline  50 mg Oral Daily  . sodium chloride  3 mL Intravenous Q12H  . sodium chloride  3 mL Intravenous Q12H    Assessment/Plan: 1.Pneumonia, doing well 2. Pleural effusion- will check cxr tomorrow 3. ESRD - dialysis per renal 4. Aortic stenosis no change  LOS: 6 days   [facility] 06/30/2012, 6:46 AM

## 2012-06-30 NOTE — Progress Notes (Signed)
I have seen and examined this patient and agree with plan as outlined above.  Will plan for isolated UF tomorrow to help manage fluid/pleural effusion.  Otherwise maintain on TTS schedule.  Dispo per primary svc. Lauralynn Loeb A,MD 06/30/2012 11:02 AM

## 2012-06-30 NOTE — Progress Notes (Signed)
Subjective::" Feeling better but a little weak  Hemodialyses 3 days in a row" Objective Vital signs in last 24 hours: Filed Vitals:   06/29/12 1258 06/29/12 1802 06/29/12 2125 06/30/12 0607  BP: 106/49 122/46 126/52 151/55  Pulse: 83 74 77 74  Temp: 98.8 F (37.1 C) 98 F (36.7 C) 97.6 F (36.4 C) 97.3 F (36.3 C)  TempSrc: Oral Oral Oral Oral  Resp: 20 18 18 18   Height:      Weight:   54.6 kg (120 lb 5.9 oz)   SpO2: 99% 99% 96% 99%   Weight change: -2.1 kg (-4 lb 10.1 oz)  Intake/Output Summary (Last 24 hours) at 06/30/12 0932 Last data filed at 06/29/12 1803  Gross per 24 hour  Intake    360 ml  Output   1736 ml  Net  -1376 ml   Labs: Basic Metabolic Panel:  Lab 06/29/12 1610 06/27/12 0830 06/25/12 0500  NA 134* 132* 135  K 4.8 3.5 3.5  CL 98 94* 94*  CO2 28 27 27   GLUCOSE 112* 75 65*  BUN 25* 29* 28*  CREATININE 2.45* 3.09* 3.39*  CALCIUM 9.3 8.9 8.8  ALB -- -- --  PHOS 2.6 3.6 --   Liver Function Tests:  Lab 06/29/12 0900 06/27/12 0830  AST -- --  ALT -- --  ALKPHOS -- --  BILITOT -- --  PROT -- --  ALBUMIN 2.0* 1.9*   No results found for this basename: LIPASE:3,AMYLASE:3 in the last 168 hours No results found for this basename: AMMONIA:3 in the last 168 hours CBC:  Lab 06/29/12 0900 06/27/12 0830 06/25/12 0500 06/24/12 1534  WBC 13.1* 14.7* 14.2* --  NEUTROABS -- -- -- 14.3*  HGB 11.0* 9.8* 9.9* --  HCT 35.2* 31.6* 32.1* --  MCV 90.5 89.3 90.2 90.1  PLT 385 355 332 --   Cardiac Enzymes: No results found for this basename: CKTOTAL:5,CKMB:5,CKMBINDEX:5,TROPONINI:5 in the last 168 hours CBG: No results found for this basename: GLUCAP:5 in the last 168 hours  Iron Studies: No results found for this basename: IRON,TIBC,TRANSFERRIN,FERRITIN in the last 72 hours Studies/Results: Dg Chest 2 View  06/28/2012  *RADIOLOGY REPORT*  Clinical Data: Follow-up pleural effusion.  CHEST - 2 VIEW  Comparison: 06/26/2012  Findings: Right dialysis catheter  remains in place, unchanged. Improving cardiomegaly.  Continued venous congestion, moderate pleural effusions and bibasilar opacities, not significantly changed.  IMPRESSION: Improving cardiomegaly.  Bilateral effusions and lower lobe opacities are unchanged.   Original Report Authenticated By: Charlett Nose, M.D.    Dg Chest Port 1 View  06/29/2012  *RADIOLOGY REPORT*  Clinical Data: Pleural effusions, end-stage renal disease  PORTABLE CHEST - 1 VIEW  Comparison: 06/28/2012  Findings: Stable moderate bilateral pleural effusions with basilar atelectasis / consolidation as before.  Stable heart size and vascular congestion.  Right IJ dialysis catheter tips are in the SVC.  Atherosclerosis of the aorta.  IMPRESSION: Stable bilateral pleural effusions with basilar atelectasis / consolidation.  No interval change.   Original Report Authenticated By: Judie Petit. Miles Costain, M.D.    Medications:      . allopurinol  100 mg Oral BID  . antiseptic oral rinse  15 mL Mouth Rinse BID  . aspirin EC  81 mg Oral Daily  . atorvastatin  40 mg Oral Daily  . calcium carbonate  1,250 mg Oral BID WC  . darbepoetin (ARANESP) injection - DIALYSIS  100 mcg Intravenous Q Tue-HD  . enoxaparin (LOVENOX) injection  30 mg Subcutaneous Q24H  .  feeding supplement (NEPRO CARB STEADY)  237 mL Oral BID BM  . levofloxacin  500 mg Oral QODAY  . multivitamin  1 tablet Oral QHS  . pantoprazole  40 mg Oral Daily  . paricalcitol  1 mcg Intravenous 3 times weekly  . predniSONE  5 mg Oral Daily  . sertraline  50 mg Oral Daily  . sodium chloride  3 mL Intravenous Q12H  . sodium chloride  3 mL Intravenous Q12H   I  have reviewed scheduled and prn medications.  Physical Exam: General: NAD,just finished chair Bath Heart: RRR 3/6 murmur  Lungs:BS decreased at  bases  Abdomen: soft, nontender Extremities: no pedal edema   Dialysis Access: Right upper AVF pos bruit and maturing - right I-J perm cath  Dialysis Orders: Center: NW on TTS Optiflux  180 (kt/v's >2 could use 160)  EDW 58.5 HD Bath 2K 2.25 Ca Time 4 Heparin 4000 with 1000 mid treatment. Access right I-J and maturing right upper AVF BFR 400 DFR A 1.5 Zemplar 1 mcg IV/HD Epogen 6000 Units IV/HD Venofer None - s/p full course Venofer with last dose 10/24. Labs 10/24 Hgb 10.4 K 3.7 Fe 35 25 % sat Alb 3 Ca 9 P 3.8 iPTH 101  Assessment/Plan:   1. Pneumonia/Hypoxia -Now on po levaquin - pul rec 8 day total;; some hypoxia could be volume related. She was NOT on O2 prior to admission. Sats good now. Yesterday to 93 % pre hd / room air post HD 99%/ needs check on room air 2. 1 of 2 BC Gram coag neg staph 10/28; BC x 2 10/23 negative-possible contaminant; changed to levaquin Perm cath nontender 3. Severe AS - fairly stable  for now; non surgical candidate; gentle lowering of volume with hd/ 99% o2 sat on 2 l [place] O2 4. ESRD - TTS per routine - AVF maturation procedure cancelled. Her son, Merlyn Albert  wishes to reschedule as he is the one who takes her to these procedures. Extra tmt yeterday for HD/UF goal 3 liters;Had net [id number] on routine schedule Saturday. 5. .Hypertension/volume - volume control only; no meds; UF Tuesday 2.3 with post wt 57.1 Thursday UF 2.4 with post wt 57.6, 2.9 with post wt 54.3. yesterday post wt 54,6 kg/ with 1736 uf For Am CXR/ fu  results . Maty need am hd for volume issue/ No significant changed noted on cxr done yesterday pre hd 6. Anemia - Hgb up to 11 - probably due to volume concentration ; dose Ararnesp 100 x 1; ferritin  from 1807 in July to 3500 after a full course of Venofer 100 x 10; tsat increased from 19 to 25% Fe increased 30 to 35. Hgb stable  7. Metabolic bone disease - Phos down to 2.6 on [place]  and on zemplar  8. Nutrition - renal diet + supplements to augment protein and kcal; Alb 1.9  Lenny Pastel, PA-C [facility] Beeper [phone] 06/30/2012,9:32 AM  LOS: 6 days

## 2012-07-01 ENCOUNTER — Ambulatory Visit (HOSPITAL_COMMUNITY): Admission: RE | Admit: 2012-07-01 | Payer: Medicare Other | Source: Ambulatory Visit | Admitting: Vascular Surgery

## 2012-07-01 ENCOUNTER — Inpatient Hospital Stay (HOSPITAL_COMMUNITY): Payer: Medicare Other

## 2012-07-01 LAB — CULTURE, BLOOD (ROUTINE X 2): Culture: NO GROWTH

## 2012-07-01 MED ORDER — ACETAMINOPHEN 325 MG PO TABS
ORAL_TABLET | ORAL | Status: AC
  Administered 2012-07-01: 650 mg via ORAL
  Filled 2012-07-01: qty 2

## 2012-07-01 MED ORDER — HEPARIN SOD (PORK) LOCK FLUSH 100 UNIT/ML IV SOLN: 4200.0000 [IU] | Freq: Once | INTRAVENOUS | Status: DC

## 2012-07-01 MED ORDER — HEPARIN SODIUM (PORCINE) 1000 UNIT/ML DIALYSIS
2000.0000 [IU] | INTRAMUSCULAR | Status: DC | PRN
  Administered 2012-07-02: 2000 [IU] via INTRAVENOUS_CENTRAL
  Filled 2012-07-01: qty 2

## 2012-07-01 NOTE — Progress Notes (Signed)
Physical Therapy Treatment Patient Details Name: Miranda [name] MRN: [id number] DOB: 01-Jan-1922 Today's Date: 07/01/2012 Time: 1545-1600 PT Time Calculation (min): 15 min  PT Assessment / Plan / Recommendation Comments on Treatment Session  Today's session was to assess response to activity, and attempt to wean supplemental O2; Unable to wean O2 with activity as pt desatted to 85% with activity on Room Air    Follow Up Recommendations  Post acute inpatient     Does the patient have the potential to tolerate intense rehabilitation  No, Recommend SNF  Barriers to Discharge        Equipment Recommendations  3 in 1 bedside comode    Recommendations for Other Services    Frequency Min 3X/week   Plan Discharge plan remains appropriate    Precautions / Restrictions Precautions Precautions: Fall Restrictions Other Position/Activity Restrictions: Desats with acitivity   Pertinent Vitals/Pain Desatted to 85% with activity on Room Air; Restarted supplemental O2 to 2 Liters    Mobility  Bed Mobility Bed Mobility: Supine to Sit;Sitting - Scoot to Edge of Bed Supine to Sit: 4: Min assist;With rails Sitting - Scoot to [place] of Bed: 4: Min assist;With rail Details for Bed Mobility Assistance: Cues for technique, and min physical assist to lift shoulders from bed Transfers Transfers: Sit to Stand;Stand to Sit Sit to Stand: 4: Min assist;From bed;With upper extremity assist Stand to Sit: 4: Min assist;To toilet;With upper extremity assist Details for Transfer Assistance: Cues for safety, technique, and for self-monitor for acitivity tol; seemed like poorly controlled descent to commode Ambulation/Gait Ambulation/Gait Assistance: 4: Min assist Ambulation Distance (Feet): 12 Feet (bed to bathroom) Assistive device: Rolling walker Ambulation/Gait Assistance Details: Close guard for safety and cues for breathing, self-monitor and RW management Gait Pattern: Trunk flexed    Exercises     PT  Diagnosis:    PT Problem List:   PT Treatment Interventions:     PT Goals Acute Rehab PT Goals Time For Goal Achievement: 07/10/12 Potential to Achieve Goals: Good Pt will go Supine/Side to Sit: with supervision PT Goal: Supine/Side to Sit - Progress: Progressing toward goal Pt will go Sit to Supine/Side: with supervision PT Goal: Sit to Supine/Side - Progress: Progressing toward goal Pt will go Sit to Stand: with supervision PT Goal: Sit to Stand - Progress: Progressing toward goal Pt will go Stand to Sit: with supervision PT Goal: Stand to Sit - Progress: Progressing toward goal Pt will Ambulate: 51 - 150 feet;with supervision;with rolling walker PT Goal: Ambulate - Progress: Progressing toward goal  Visit Information  Last PT Received On: 07/01/12 Assistance Needed: +1 PT/OT Co-Evaluation/Treatment: Yes    Subjective Data  Subjective: Tired post HD, agreeable to amb to bathroom Patient Stated Goal: feel better   Cognition  Overall Cognitive Status: Appears within functional limits for tasks assessed/performed Area of Impairment: Memory Arousal/Alertness: Awake/alert Orientation Level: Appears intact for tasks assessed Behavior During Session: [facility] for tasks performed Memory: Decreased recall of precautions    Balance     End of Session PT - End of Session Equipment Utilized During Treatment: Oxygen Activity Tolerance: Patient limited by fatigue Patient left: Other (comment) (with OT in bathroom) Nurse Communication: Mobility status   GP     Van Clines [facility] [place], [place] [phone]  07/01/2012, 4:54 PM

## 2012-07-01 NOTE — Progress Notes (Signed)
Occupational Therapy Treatment Patient Details Name: Miranda [name] MRN: [id number] DOB: [birth year] Today's Date: 07/01/2012 Time: 4098-1191 OT Time Calculation (min): 43 min  OT Assessment / Plan / Recommendation Comments on Treatment Session Pt making good progress with mobility and ADL. Desating on RA to low 80s. Requires 2L during functional activity. discussed with nsg need to spot check O2 sats. Left on 1 L while sitting. O2 92. Pt will need 2L during any functional activity at this time. will continue to follow.    Follow Up Recommendations  Skilled nursing facility    Barriers to Discharge       Equipment Recommendations  3 in 1 bedside comode    Recommendations for Other Services    Frequency Min 2X/week   Plan Discharge plan remains appropriate    Precautions / Restrictions Precautions Precautions: Fall Restrictions Weight Bearing Restrictions: No Other Position/Activity Restrictions: Desats with acitivity   Pertinent Vitals/Pain See above for O2    ADL  Grooming: Supervision/safety;Wash/dry hands;Wash/dry face;Brushing hair Where Assessed - Grooming: Supported standing Toilet Transfer: Performed;Minimal Dentist Method: Sit to Barista: Materials engineer and Hygiene: Performed;Minimal assistance Where Assessed - Engineer, mining and Hygiene: Sit to stand from 3-in-1 or toilet;Standing Equipment Used: Gait belt;Rolling walker Transfers/Ambulation Related to ADLs: min A. ADL Comments: Focus on assessing 2 during functional activity. Pt desats during adtivity and requires 2L    OT Diagnosis:    OT Problem List:   OT Treatment Interventions:     OT Goals Acute Rehab OT Goals OT Goal Formulation: With patient Time For Goal Achievement: 07/09/12 Potential to Achieve Goals: Good ADL Goals Pt Will Perform Upper Body Bathing: with set-up;Sitting, edge of bed ADL Goal: Upper Body  Bathing - Progress: Progressing toward goals Pt Will Perform Lower Body Bathing: with min assist;Sit to stand from bed;Supported;with cueing (comment type and amount) ADL Goal: Lower Body Bathing - Progress: Progressing toward goals Pt Will Perform Upper Body Dressing: with set-up;Sitting, bed;Unsupported ADL Goal: Upper Body Dressing - Progress: Progressing toward goals Pt Will Perform Lower Body Dressing: with min assist;Supported;with cueing (comment type and amount);Sit to stand from bed ADL Goal: Lower Body Dressing - Progress: Progressing toward goals Pt Will Transfer to Toilet: with supervision;with DME;3-in-1 ADL Goal: Toilet Transfer - Progress: Progressing toward goals Pt Will Perform Toileting - Clothing Manipulation: with supervision;Sitting on 3-in-1 or toilet;Standing ADL Goal: Toileting - Clothing Manipulation - Progress: Progressing toward goals Pt Will Perform Toileting - Hygiene: with modified independence;Standing at 3-in-1/toilet;Sit to stand from 3-in-1/toilet ADL Goal: Toileting - Hygiene - Progress: Progressing toward goals  Visit Information  Last OT Received On: 07/01/12 Assistance Needed: +1 PT/OT Co-Evaluation/Treatment: Yes    Subjective Data      Prior Functioning       Cognition  Overall Cognitive Status: Appears within functional limits for tasks assessed/performed Area of Impairment: Memory Arousal/Alertness: Awake/alert Orientation Level: Appears intact for tasks assessed Behavior During Session: Miranda [name] for tasks performed Memory: Decreased recall of precautions Memory Deficits: decreased STM    Mobility  Shoulder Instructions Bed Mobility Bed Mobility: Supine to Sit;Sitting - Scoot to Edge of Bed Supine to Sit: 4: Min assist;With rails Sitting - Scoot to [place] of Bed: 5: Supervision Details for Bed Mobility Assistance: Cues for technique, and min physical assist to lift shoulders from bed Transfers Transfers: Sit to Stand;Stand to Sit Sit to  Stand: 4: Min assist;With upper extremity assist;From chair/3-in-1;From toilet Stand to Sit:  4: Min assist;With upper extremity assist;To chair/3-in-1 Details for Transfer Assistance: vc for safety and controlled descent       Exercises      Balance     End of Session OT - End of Session Equipment Utilized During Treatment: Gait belt Activity Tolerance: Patient tolerated treatment well Patient left: in chair;with call bell/phone within reach;with family/visitor present Nurse Communication: Other (comment) (spot check O2 sats)  GO     [name],Miranda [name] 07/01/2012, 4:56 PM [facility] [name], OTR/L  [phone] 07/01/2012

## 2012-07-01 NOTE — Progress Notes (Signed)
Assessment/Plan: Principal Problem:  *Pneumonia - today's dose (active through tomorrow) will be 8 days as recommended by pulm. Will d/c after today's dose.  Active Problems:  Anemia associated with chronic renal failure  Pulmonary edema - note plan for UF today. She has dialysis on TTF. Plan d/c to SN bed at [facility] on 11/6 unless something else occurs. Will try to stop O2 today.   End stage renal disease  Hypoxemia  Gout  Severe aortic stenosis  Pleural effusion - CXR pending   Subjective: Feeling pretty good. No dyspnea. Is pretty weak. Tells me her son, Miranda [name], has noted that she will be going to SN bed at [facility] (CSW note confirms this).   Objective:  Vital Signs: Filed Vitals:   06/30/12 1000 06/30/12 1400 06/30/12 2042 07/01/12 0500  BP: 122/48 136/45 139/48 143/48  Pulse: 73 71 73 73  Temp: 97.6 F (36.4 C) 97.2 F (36.2 C) 97.7 F (36.5 C) 97.8 F (36.6 C)  TempSrc: Oral Oral Oral Oral  Resp: 20 20 18 18   Height:      Weight:   55.5 kg (122 lb 5.7 oz)   SpO2: 99% 100% 100% 98%     EXAM: Comfortable. Talking in complete sentences   Intake/Output Summary (Last 24 hours) at 07/01/12 0726 Last data filed at 06/30/12 1200  Gross per 24 hour  Intake    470 ml  Output      0 ml  Net    470 ml    Lab Results:  Basename 06/29/12 0900  NA 134*  K 4.8  CL 98  CO2 28  GLUCOSE 112*  BUN 25*  CREATININE 2.45*  CALCIUM 9.3  MG --  PHOS 2.6    Basename 06/29/12 0900  AST --  ALT --  ALKPHOS --  BILITOT --  PROT --  ALBUMIN 2.0*   No results found for this basename: LIPASE:2,AMYLASE:2 in the last 72 hours  Basename 06/29/12 0900  WBC 13.1*  NEUTROABS --  HGB 11.0*  HCT 35.2*  MCV 90.5  PLT 385   No results found for this basename: CKTOTAL:3,CKMB:3,CKMBINDEX:3,TROPONINI:3 in the last 72 hours No components found with this basename: POCBNP:3 No results found for this basename: DDIMER:2 in the last 72 hours No results found for this  basename: HGBA1C:2 in the last 72 hours No results found for this basename: CHOL:2,HDL:2,LDLCALC:2,TRIG:2,CHOLHDL:2,LDLDIRECT:2 in the last 72 hours No results found for this basename: TSH,T4TOTAL,FREET3,T3FREE,THYROIDAB in the last 72 hours No results found for this basename: VITAMINB12:2,FOLATE:2,FERRITIN:2,TIBC:2,IRON:2,RETICCTPCT:2 in the last 72 hours  Studies/Results: No results found. Medications: Medications administered in the last 24 hours reviewed.  Current Medication List reviewed.    LOS: 7 days   [facility] @ [facility] [phone]) 07/01/2012, 7:26 AM

## 2012-07-01 NOTE — Progress Notes (Signed)
Off unit.

## 2012-07-01 NOTE — Progress Notes (Signed)
Subjective:  Seen on HD, no current complaints.  Objective: Vital signs in last 24 hours: Temp:  [97.2 F (36.2 C)-97.8 F (36.6 C)] 97.6 F (36.4 C) (11/04 0750) Pulse Rate:  [68-82] 82  (11/04 0858) Resp:  [16-25] 16  (11/04 0858) BP: (112-152)/(45-69) 119/69 mmHg (11/04 0858) SpO2:  [98 %-100 %] 100 % (11/04 0750) Weight:  [55.5 kg (122 lb 5.7 oz)] 55.5 kg (122 lb 5.7 oz) (11/03 2042) Weight change: 0.4 kg (14.1 oz)  Intake/Output from previous day: 11/03 0701 - 11/04 0700 In: 470 [P.O.:240] Out: -    EXAM: General appearance:  Alert, in no apparent distress Resp:  Decreased breath sounds, but clear Cardio: RRR with Gr III/VI systolic murmur GI: + BS, soft and nontender Extremities:  No edema  Access:  Right IJ catheter with BFR 400 cc/min, also maturing RUA AVF with + bruit  Lab Results:  Basename 06/29/12 0900  WBC 13.1*  HGB 11.0*  HCT 35.2*  PLT 385   BMET:  Basename 06/29/12 0900  NA 134*  K 4.8  CL 98  CO2 28  GLUCOSE 112*  BUN 25*  CREATININE 2.45*  CALCIUM 9.3  ALBUMIN 2.0*   No results found for this basename: PTH:2 in the last 72 hours Iron Studies: No results found for this basename: IRON,TIBC,TRANSFERRIN,FERRITIN in the last 72 hours  Dialysis Orders: [facility]: [facility] on TTS Optiflux 180 (kt/v's >2 could use 160)  EDW 58.5 HD Bath 2K 2.25 Ca Time 4 Heparin 4000 with 1000 mid treatment. Access right I-J and maturing right upper AVF BFR 400 DFR A 1.5 Zemplar 1 mcg IV/HD Epogen 6000 Units IV/HD Venofer None - s/p full course Venofer with last dose 10/24.  Assessment/Plan: 1. Pneumonia/Hypoxia - Now on Levaquin 500 mg PO qod, chest x-ray today showed moderate effusions with new Kerley B lines, suggesting interstitial edema.  Lower EDW. Still with extra volume/effusions on xray today. Plan daily HD for 2-2.5kg until volume issues resolved (is possible with severe AS).  2. 1 of 2 BCs with Gr + staph on 10/28 - likely contaminant, antibiotics changed to  Levaquin. 3. Severe AS - currently stable, non-surgical candidate; decreasing volume with HD. 4. ESRD - HD on T[facility]; s/p HD on 10/31, 11/1, & 11/2; AVF maturation procedure postponed so that her son may take her.  5. HTN/Volume - BP currently 100/52, multiple Txs to remove fluid, lowest wt 54.3 kg.  UF goal of 2 L.  6. Anemia - Hgb up to 11 (previously < 10) on Aranesp 100 mcg on Tues.  Hold tomorrow. 7. Metabolic bone disease - Ca 9.3 (10.9 corrected), P 2.6, on Zemplar 1 mcg. 8. Nutrition - Alb 2, renal diet.   LOS: 7 days   LYLES,CHARLES 07/01/2012,9:36 AM  Patient seen and examined and agree with assessment and plan as above with additions as indicated.  Vinson Moselle  MD [facility] [phone] pgr    [phone] cell 07/01/2012, 3:32 PM

## 2012-07-01 NOTE — Progress Notes (Signed)
SATURATION QUALIFICATIONS:  Patient Saturations on Room Air at Rest = 92%  Patient Saturations on [employer] while Ambulating/with Activity = 85%  Patient re initiated on 1 l/m via n/c, O2 saturation at 89%. Increased to 2 l/m via c/c, O2 saturation increased to 94%.

## 2012-07-02 ENCOUNTER — Inpatient Hospital Stay (HOSPITAL_COMMUNITY): Payer: Medicare Other

## 2012-07-02 LAB — RENAL FUNCTION PANEL
Albumin: 2.1 g/dL — ABNORMAL LOW (ref 3.5–5.2)
BUN: 34 mg/dL — ABNORMAL HIGH (ref 6–23)
CO2: 29 meq/L (ref 19–32)
Calcium: 9.2 mg/dL (ref 8.4–10.5)
Chloride: 93 meq/L — ABNORMAL LOW (ref 96–112)
Creatinine, Ser: 3.1 mg/dL — ABNORMAL HIGH (ref 0.50–1.10)
GFR calc Af Amer: 14 mL/min — ABNORMAL LOW
GFR calc non Af Amer: 12 mL/min — ABNORMAL LOW
Glucose, Bld: 72 mg/dL (ref 70–99)
Phosphorus: 3.4 mg/dL (ref 2.3–4.6)
Potassium: 4.3 meq/L (ref 3.5–5.1)
Sodium: 131 meq/L — ABNORMAL LOW (ref 135–145)

## 2012-07-02 LAB — CBC
HCT: 32.9 % — ABNORMAL LOW (ref 36.0–46.0)
Hemoglobin: 10.3 g/dL — ABNORMAL LOW (ref 12.0–15.0)
MCH: 28.2 pg (ref 26.0–34.0)
MCHC: 31.3 g/dL (ref 30.0–36.0)
MCV: 90.1 fL (ref 78.0–100.0)
Platelets: 327 10*3/uL (ref 150–400)
RBC: 3.65 MIL/uL — ABNORMAL LOW (ref 3.87–5.11)
RDW: 19.9 % — ABNORMAL HIGH (ref 11.5–15.5)
WBC: 14.1 10*3/uL — ABNORMAL HIGH (ref 4.0–10.5)

## 2012-07-02 MED ORDER — DARBEPOETIN ALFA-POLYSORBATE 100 MCG/0.5ML IJ SOLN
INTRAMUSCULAR | Status: AC
  Administered 2012-07-02: 100 ug via INTRAVENOUS
  Filled 2012-07-02: qty 0.5

## 2012-07-02 MED ORDER — PARICALCITOL 5 MCG/ML IV SOLN
INTRAVENOUS | Status: AC
  Administered 2012-07-02: 1 ug via INTRAVENOUS
  Filled 2012-07-02: qty 1

## 2012-07-02 MED ORDER — HEPARIN SODIUM (PORCINE) 1000 UNIT/ML DIALYSIS
3000.0000 [IU] | INTRAMUSCULAR | Status: DC | PRN
  Administered 2012-07-03: 4200 [IU] via INTRAVENOUS_CENTRAL
  Filled 2012-07-02: qty 3

## 2012-07-02 NOTE — Clinical Social Work Note (Signed)
CSW is continuing to monitor patient's progress and she is not yet ready to discharge to the Healthcare facility at [facility].  CSW made contact with Elsie Lincoln at [facility] and updated on patient's progress.  CSW will continue to follow and facilitate discharge when patient medically ready.  Genelle Bal, MSW, LCSW [phone]

## 2012-07-02 NOTE — Progress Notes (Signed)
Assessment/Plan: Principal Problem:  *Pneumonia - today is day 8 of abx. Per pulm will d/c today.  Active Problems:  Anemia associated with chronic renal failure  Pulmonary edema - worse on CXR yesterday. Note renal plan to do daily HD until (if) getting fluid off. I am not sure how often a CXR is needed and would ask them to order that at interval they deem appropriate. How long she needs daily HD will determine LOS. From my point of view she is ready to go to [facility] bed at [facility]. However, it would be unreasonable for her to do daily HD from there. Will plan on d/c when she can return to tiw HD. Again, that is up to renal. She does have continuing oxygen need at this time. If she stays for several more days will need to check O2 on RA once again.   End stage renal disease  Hypoxemia  Gout  Severe aortic stenosis  Pleural effusion   Subjective: Feels okay. Breathing is fine. No chest pain  Objective:  Vital Signs: Filed Vitals:   07/01/12 2007 07/02/12 0452 07/02/12 0620 07/02/12 0631  BP: 119/46 152/66 143/66 149/68  Pulse: 80 74 74 74  Temp: 98 F (36.7 C) 97.2 F (36.2 C) 96.7 F (35.9 C)   TempSrc: Oral Oral Oral   Resp: 16 16 26    Height:      Weight: 54.2 kg (119 lb 7.8 oz)  55.1 kg (121 lb 7.6 oz)   SpO2: 97% 100% 97%      EXAM: Seen in HD. Did not listen to lungs   Intake/Output Summary (Last 24 hours) at 07/02/12 0651 Last data filed at 07/01/12 1809  Gross per 24 hour  Intake    440 ml  Output   1412 ml  Net   -972 ml    Lab Results:  Basename 06/29/12 0900  NA 134*  K 4.8  CL 98  CO2 28  GLUCOSE 112*  BUN 25*  CREATININE 2.45*  CALCIUM 9.3  MG --  PHOS 2.6    Basename 06/29/12 0900  AST --  ALT --  ALKPHOS --  BILITOT --  PROT --  ALBUMIN 2.0*   No results found for this basename: LIPASE:2,AMYLASE:2 in the last 72 hours  Basename 07/02/12 0635 06/29/12 0900  WBC 14.1* 13.1*  NEUTROABS -- --  HGB 10.3* 11.0*  HCT 32.9* 35.2*    MCV 90.1 90.5  PLT 327 385   No results found for this basename: CKTOTAL:3,CKMB:3,CKMBINDEX:3,TROPONINI:3 in the last 72 hours No components found with this basename: POCBNP:3 No results found for this basename: DDIMER:2 in the last 72 hours No results found for this basename: HGBA1C:2 in the last 72 hours No results found for this basename: CHOL:2,HDL:2,LDLCALC:2,TRIG:2,CHOLHDL:2,LDLDIRECT:2 in the last 72 hours No results found for this basename: TSH,T4TOTAL,FREET3,T3FREE,THYROIDAB in the last 72 hours No results found for this basename: VITAMINB12:2,FOLATE:2,FERRITIN:2,TIBC:2,IRON:2,RETICCTPCT:2 in the last 72 hours  Studies/Results: Dg Chest 2 View  07/01/2012  *RADIOLOGY REPORT*  Clinical Data: Weakness, shortness of breath  CHEST - 2 VIEW  Comparison: 06/29/2012  Findings: Dual lead right-sided hemodialysis catheter tips terminate over the distal SVC and high right atrium, respectively. Stable bilateral moderate posteriorly tracking pleural effusions obscuring the lung bases.  Stable moderate enlargement of the cardiomediastinal silhouette.  Central vascular congestion is noted with a few basilar Kerley B lines.  These are new.  IMPRESSION: Moderate pleural effusions with new interstitial Kerley B lines could suggest developing interstitial edema.  Original Report Authenticated By: Christiana Pellant, M.D.    Medications: Medications administered in the last 24 hours reviewed.  Current Medication List reviewed.    LOS: 8 days   [facility] @ [facility] [phone]) 07/02/2012, 6:51 AM

## 2012-07-02 NOTE — Progress Notes (Signed)
Subjective:   Seen on HD, no complaints.  Objective: Vital signs in last 24 hours: Temp:  [97.2 F (36.2 C)-98.7 F (37.1 C)] 97.2 F (36.2 C) (11/05 0452) Pulse Rate:  [68-82] 74  (11/05 0452) Resp:  [13-25] 16  (11/05 0452) BP: (88-152)/(41-69) 152/66 mmHg (11/05 0452) SpO2:  [96 %-100 %] 100 % (11/05 0452) Weight:  [54.2 kg (119 lb 7.8 oz)-54.3 kg (119 lb 11.4 oz)] 54.2 kg (119 lb 7.8 oz) (11/04 2007) Weight change: -1.2 kg (-2 lb 10.3 oz)  Intake/Output from previous day: 11/04 0701 - 11/05 0700 In: 440 [P.O.:440] Out: 1412    EXAM: General appearance:  Alert, in no apparent distress Resp:  CTA without rales, rhonchi, or wheezes Cardio:  RRR with Gr III/VI systolic murmur GI:  + BS, soft and nontender Extremities:  No edema Access:  Right IJ catheter with BFR 400 cc/min, also maturing RUA AVF with + bruit  Lab Results:  Basename 06/29/12 0900  WBC 13.1*  HGB 11.0*  HCT 35.2*  PLT 385   BMET:  Basename 06/29/12 0900  NA 134*  K 4.8  CL 98  CO2 28  GLUCOSE 112*  BUN 25*  CREATININE 2.45*  CALCIUM 9.3  ALBUMIN 2.0*   No results found for this basename: PTH:2 in the last 72 hours Iron Studies: No results found for this basename: IRON,TIBC,TRANSFERRIN,FERRITIN in the last 72 hours  Dialysis Orders: [facility]: [facility] on TTS Optiflux 180 (kt/v's >2 could use 160)  EDW 58.5 HD Bath 2K 2.25 Ca Time 4 Heparin 4000 with 1000 mid treatment. Access right I-J and maturing right upper AVF BFR 400 DFR A 1.5 Zemplar 1 mcg IV/HD Epogen 6000 Units IV/HD Venofer None - s/p full course Venofer with last dose 10/24.  Assessment/Plan: 1. Pneumonia/Hypoxia - Now on Levaquin 500 mg PO qod, chest x-ray yesterday showed moderate effusions with new Kerley B lines, suggesting interstitial edema; daily HD for 2-2.5kg until volume issues resolved (is possible with severe AS).  2. 1 of 2 BCs with Gr + staph on 10/28 - likely contaminant, antibiotics changed to Levaquin. 3. Severe AS - currently  stable, non-surgical candidate; decreasing volume with HD. 4. ESRD - HD on T[facility]; s/p HD on 10/31, 11/1, 11/2, & 11/4; AVF maturation procedure postponed so that her son may take her.   5. HTN/Volume - BP currently 143/66, multiple Txs to remove fluid, lowest wt 54.3 kg. UF goal of 2.5 L today.  6. Anemia - Hgb up to 11 (previously < 10) on Aranesp 100 mcg on Tues. 7. Metabolic bone disease - Ca 9.3 (10.9 corrected), P 2.6, on Zemplar 1 mcg. 8. Nutrition - Alb 2, renal diet.   LOS: 8 days   LYLES,CHARLES 07/02/2012,6:30 AM  Patient seen and examined and agree with assessment and plan as above. HD today and again tomorrow, pull weight down- trying to dry out pleural effusions.  Vinson Moselle  MD [employer] [employer] [phone] pgr    [phone] cell 07/02/2012, 2:15 PM

## 2012-07-03 ENCOUNTER — Inpatient Hospital Stay (HOSPITAL_COMMUNITY): Payer: Medicare Other

## 2012-07-03 LAB — RENAL FUNCTION PANEL
BUN: 29 mg/dL — ABNORMAL HIGH (ref 6–23)
CO2: 29 mEq/L (ref 19–32)
GFR calc Af Amer: 19 mL/min — ABNORMAL LOW (ref >90)
Glucose, Bld: 71 mg/dL (ref 70–99)
Potassium: 3.8 mEq/L (ref 3.5–5.1)
Sodium: 130 mEq/L — ABNORMAL LOW (ref 135–145)

## 2012-07-03 LAB — CBC
HCT: 33.5 % — ABNORMAL LOW (ref 36.0–46.0)
Hemoglobin: 10.4 g/dL — ABNORMAL LOW (ref 12.0–15.0)
MCH: 28.1 pg (ref 26.0–34.0)
RBC: 3.7 MIL/uL — ABNORMAL LOW (ref 3.87–5.11)

## 2012-07-03 MED ORDER — HEPARIN SOD (PORK) LOCK FLUSH 100 UNIT/ML IV SOLN
4200.0000 [IU] | Freq: Once | INTRAVENOUS | Status: DC
  Filled 2012-07-03: qty 45

## 2012-07-03 MED ORDER — HEPARIN SODIUM (PORCINE) 1000 UNIT/ML DIALYSIS
4000.0000 [IU] | INTRAMUSCULAR | Status: DC | PRN
  Administered 2012-07-04: 4000 [IU] via INTRAVENOUS_CENTRAL
  Filled 2012-07-03: qty 4

## 2012-07-03 NOTE — Progress Notes (Signed)
Assessment/Plan: Principal Problem:  *Pneumonia - treatment course completed Active Problems:  Anemia associated with chronic renal failure  Pulmonary edema - doing daily HD. Ready for SN bed at [facility] when okay with renal.   End stage renal disease  Hypoxemia  Gout  Severe aortic stenosis  Pleural effusion   Subjective: Feels weak which she attributes to the increased dialysis. Breathing is fine. No chest pain or cough  Objective:  Vital Signs: Filed Vitals:   07/02/12 1400 07/02/12 1725 07/02/12 2050 07/03/12 0405  BP: 135/69 140/65 110/46 138/61  Pulse: 79 78 68 77  Temp: 98.4 F (36.9 C) 98.2 F (36.8 C) 98.1 F (36.7 C) 97.8 F (36.6 C)  TempSrc: Oral Oral Oral Oral  Resp:  20 18 20   Height:      Weight:   54.3 kg (119 lb 11.4 oz)   SpO2: 97% 98% 97% 99%     EXAM: seen in HD   Intake/Output Summary (Last 24 hours) at 07/03/12 0643 Last data filed at 07/02/12 1800  Gross per 24 hour  Intake    827 ml  Output   2327 ml  Net  -1500 ml    Lab Results:  Basename 07/02/12 0635  NA 131*  K 4.3  CL 93*  CO2 29  GLUCOSE 72  BUN 34*  CREATININE 3.10*  CALCIUM 9.2  MG --  PHOS 3.4    Basename 07/02/12 0635  AST --  ALT --  ALKPHOS --  BILITOT --  PROT --  ALBUMIN 2.1*   No results found for this basename: LIPASE:2,AMYLASE:2 in the last 72 hours  Basename 07/02/12 0635  WBC 14.1*  NEUTROABS --  HGB 10.3*  HCT 32.9*  MCV 90.1  PLT 327   No results found for this basename: CKTOTAL:3,CKMB:3,CKMBINDEX:3,TROPONINI:3 in the last 72 hours No components found with this basename: POCBNP:3 No results found for this basename: DDIMER:2 in the last 72 hours No results found for this basename: HGBA1C:2 in the last 72 hours No results found for this basename: CHOL:2,HDL:2,LDLCALC:2,TRIG:2,CHOLHDL:2,LDLDIRECT:2 in the last 72 hours No results found for this basename: TSH,T4TOTAL,FREET3,T3FREE,THYROIDAB in the last 72 hours No results found for this  basename: VITAMINB12:2,FOLATE:2,FERRITIN:2,TIBC:2,IRON:2,RETICCTPCT:2 in the last 72 hours  Studies/Results: Dg Chest 2 View  07/01/2012  *RADIOLOGY REPORT*  Clinical Data: Weakness, shortness of breath  CHEST - 2 VIEW  Comparison: 06/29/2012  Findings: Dual lead right-sided hemodialysis catheter tips terminate over the distal SVC and high right atrium, respectively. Stable bilateral moderate posteriorly tracking pleural effusions obscuring the lung bases.  Stable moderate enlargement of the cardiomediastinal silhouette.  Central vascular congestion is noted with a few basilar Kerley B lines.  These are new.  IMPRESSION: Moderate pleural effusions with new interstitial Kerley B lines could suggest developing interstitial edema.   Original Report Authenticated By: Christiana Pellant, M.D.    Medications: Medications administered in the last 24 hours reviewed.  Current Medication List reviewed.    LOS: 9 days   [facility] @ [facility] [phone]) 07/03/2012, 6:43 AM

## 2012-07-03 NOTE — Progress Notes (Signed)
[facility]  Daily Progress Note  Assessment/Planning: Pneumonia, Fluid overload, Severe AS   It appears the pt's severe AS is having a bigger impact on HD that anticipated  Theoretically the R BC AVF once mature should be able to support >1000 flow rates but I'm not certain the pt's heart will be up to it.  I would let her recover completely from a cardiopulmonary stand point before addressing the R [facility] AVF with a Balloon assisted maturation procedure and side branch ligation..  Subjective    Breathing a bit better today  Objective Filed Vitals:   07/03/12 0959 07/03/12 1012 07/03/12 1030 07/03/12 1100  BP: 87/49 101/46 92/43 98/43   Pulse: 65 69 76 81  Temp:  97.5 F (36.4 C)  98.1 F (36.7 C)  TempSrc:  Axillary  Oral  Resp: 17 13 18 18   Height:      Weight:  117 lb 15.1 oz (53.5 kg)    SpO2:  93%  95%    Intake/Output Summary (Last 24 hours) at 07/03/12 1405 Last data filed at 07/03/12 1130  Gross per 24 hour  Intake    717 ml  Output   1718 ml  Net  -1001 ml    PULM  CTAB CV  Faint rales B GI  soft, NTND VASC  Palpable R BC AVF thrill, +bruit  Laboratory CBC    Component Value Date/Time   WBC 16.0* 07/03/2012 0713   WBC 12.3* 03/07/2012 1100   HGB 10.4* 07/03/2012 0713   HGB 8.4* 03/07/2012 1100   HCT 33.5* 07/03/2012 0713   HCT 26.8* 03/07/2012 1100   PLT 352 07/03/2012 0713   PLT 356 03/07/2012 1100    BMET    Component Value Date/Time   NA 130* 07/03/2012 0713   K 3.8 07/03/2012 0713   CL 91* 07/03/2012 0713   CO2 29 07/03/2012 0713   GLUCOSE 71 07/03/2012 0713   BUN 29* 07/03/2012 0713   CREATININE 2.50* 07/03/2012 0713   CALCIUM 9.4 07/03/2012 0713   GFRNONAA 16* 07/03/2012 0713   GFRAA 19* 07/03/2012 0713    Leonides Sake, MD [facility] [place] Office: [phone] Pager: [phone]  07/03/2012, 2:05 PM

## 2012-07-03 NOTE — Progress Notes (Signed)
Subjective:   Seen on HD, no complaints.  Objective: Vital signs in last 24 hours: Temp:  [97 F (36.1 C)-98.7 F (37.1 C)] 98.7 F (37.1 C) (11/06 0635) Pulse Rate:  [65-84] 75  (11/06 0830) Resp:  [12-20] 16  (11/06 0830) BP: (91-146)/(46-77) 116/73 mmHg (11/06 0830) SpO2:  [97 %-99 %] 97 % (11/06 0635) Weight:  [52.6 kg (115 lb 15.4 oz)-54.4 kg (119 lb 14.9 oz)] 54.4 kg (119 lb 14.9 oz) (11/06 0635) Weight change: -1.7 kg (-3 lb 12 oz)  Intake/Output from previous day: 11/05 0701 - 11/06 0700 In: 827 [P.O.:480] Out: 2327    EXAM: General appearance:  Alert, in no apparent distress, cachectic and frail, pleasant Resp:  CTA without rales, rhonchi, or wheezes Cardio:  RRR with Gr III/VI systolic murmur GI:  + BS, soft and nontender Extremities:  No edema Access:  Right IJ catheter with BFR 400 cc/min, also maturing RUA AVF with + bruit  Lab Results:  [facility] 07/03/12 0713 07/02/12 0635  WBC 16.0* 14.1*  HGB 10.4* 10.3*  HCT 33.5* 32.9*  PLT 352 327   BMET:   Basename 07/03/12 0713 07/02/12 0635  NA 130* 131*  K 3.8 4.3  CL 91* 93*  CO2 29 29  GLUCOSE 71 72  BUN 29* 34*  CREATININE 2.50* 3.10*  CALCIUM 9.4 9.2  ALBUMIN 2.2* 2.1*   No results found for this basename: PTH:2 in the last 72 hours Iron Studies: No results found for this basename: IRON,TIBC,TRANSFERRIN,FERRITIN in the last 72 hours  Dialysis Orders: [facility]: [facility] on TTS Optiflux 180 (kt/v's >2 could use 160)  EDW 58.5 HD Bath 2K 2.25 Ca Time 4 Heparin 4000 with 1000 mid treatment. Access right I-J and maturing right upper AVF BFR 400 DFR A 1.5 Zemplar 1 mcg IV/HD Epogen 6000 Units IV/HD Venofer None - s/p full course Venofer with last dose 10/24.  Assessment/Plan: 1. Pneumonia - Now on Levaquin 500 mg PO qod 2. Severe AS/vol excess/pleural effusions- getting significant vol off with daily HD. HD again today and tomorrow and should be ready for d/c then after HD on Thursday. 3. ESRD - [facility] on T[facility];  s/p HD on 10/31, 11/1, 11/2, & 11/4; AVF maturation procedure postponed so that her son may take her.   4. HTN/Volume - BP currently 143/66, multiple Txs to remove fluid, lowest wt 54.3 kg. UF goal of 2.5 L today.  5. Anemia - Hgb up to 11 (previously < 10) on Aranesp 100 mcg on Tues. 6. Metabolic bone disease - Ca 9.3 (10.9 corrected), P 2.6, on Zemplar 1 mcg. 7. Nutrition - Alb 2, renal diet. 8. EOL- discussed code status with patient. She requests no heroic measures, CPR, life support, etc. Will write DNR order. We discussed the fact that her health is failing and that the fluid will likely reaccumalate with her heart disease and she is aware that at any time she chooses she may withdraw from dialysis and we will provide comfort care.   Vinson Moselle  MD [facility] [phone] pgr    [phone] cell 07/03/2012, 9:02 AM

## 2012-07-03 NOTE — Procedures (Signed)
I was present at this dialysis session. I have reviewed the session itself and made appropriate changes.   Vinson Moselle, MD [employer] [employer] 07/03/2012, 9:03 AM

## 2012-07-03 NOTE — Progress Notes (Signed)
Physical Therapy Treatment Patient Details Name: Miranda [name] MRN: [id number] DOB: March 21, 1922 Today's Date: 07/03/2012 Time: 0865-7846 PT Time Calculation (min): 22 min  PT Assessment / Plan / Recommendation Comments on Treatment Session  Good progress today, though amb distance still limited; Session conducted on Room Air, and O2 sats remained greater than or equal to 93%    Follow Up Recommendations  SNF     Does the patient have the potential to tolerate intense rehabilitation  No, Recommend SNF  Barriers to Discharge        Equipment Recommendations  3 in 1 bedside comode    Recommendations for Other Services    Frequency Min 3X/week   Plan Discharge plan remains appropriate    Precautions / Restrictions Precautions Precautions: Fall   Pertinent Vitals/Pain See Comments    Mobility  Bed Mobility Bed Mobility: Supine to Sit;Sitting - Scoot to Edge of Bed Supine to Sit: 5: Supervision;HOB elevated;With rails Sitting - Scoot to Edge of Bed: 5: Supervision;With rail Transfers Transfers: Sit to Stand;Stand to Sit Sit to Stand: 4: Min assist;With upper extremity assist;From chair/3-in-1 Stand to Sit: 4: Min guard;With upper extremity assist;To chair/3-in-1 Details for Transfer Assistance: vc for Pt to control safe descent; Min phayical assist with rise from chair Ambulation/Gait Ambulation/Gait Assistance: 4: Min guard Ambulation Distance (Feet): 22 Feet Assistive device: Rolling walker Ambulation/Gait Assistance Details: Cues for self-monitor for activity tol and posture Gait Pattern: Trunk flexed    Exercises     PT Diagnosis:    PT Problem List:   PT Treatment Interventions:     PT Goals Acute Rehab PT Goals Time For Goal Achievement: 07/10/12 Potential to Achieve Goals: Good Pt will go Sit to Stand: with supervision PT Goal: Sit to Stand - Progress: Progressing toward goal Pt will go Stand to Sit: with supervision PT Goal: Stand to Sit - Progress:  Progressing toward goal Pt will Ambulate: 51 - 150 feet;with supervision;with rolling walker PT Goal: Ambulate - Progress: Progressing toward goal (slowly)  Visit Information  Last PT Received On: 07/03/12 Assistance Needed: +1    Subjective Data  Subjective: Tired post OT, agreeable to amb; "I always want to say no, but then feel better after doing it" Patient Stated Goal: feel better   Cognition  Overall Cognitive Status: Appears within functional limits for tasks assessed/performed Area of Impairment: Memory Arousal/Alertness: Awake/alert Orientation Level: Appears intact for tasks assessed Behavior During Session: [facility] for tasks performed Memory:  (Pt. recalled safety precautions for transfers.)    Balance     End of Session PT - End of Session Equipment Utilized During Treatment:  (On Room Air!) Activity Tolerance: Patient limited by pain;Patient tolerated treatment well (amb distance limited by back pain today) Patient left: in chair;with call bell/phone within reach Nurse Communication: Mobility status   GP     Van Clines [facility] [place], [place] [phone]  07/03/2012, 4:19 PM

## 2012-07-03 NOTE — Progress Notes (Signed)
Occupational Therapy Treatment Patient Details Name: Miranda [name] MRN: [id number] DOB: 08-11-1922 Today's Date: 07/03/2012 Time: 2841-3244 OT Time Calculation (min): 44 min  OT Assessment / Plan / Recommendation Comments on Treatment Session Pt making good progress with mobility and ADL.  Pt was able to perform ADL on room air with O2 levels >90. BP was 111/48 at the end of the treatment. Pt. was reminded of safe transfer precautions and theraband exercises for UE. Pt left on room air with O2 sats at 97. Discussed with RN to check O2 sats intermittenly.    Follow Up Recommendations  SNF    Barriers to Discharge       Equipment Recommendations  3 in 1 bedside comode    Recommendations for Other Services    Frequency Min 2X/week   Plan Discharge plan remains appropriate    Precautions / Restrictions Precautions Precautions: Fall   Pertinent Vitals/Pain  End of session BP 111/48 and O2 97 RA HR 79    ADL  Grooming: Wash/dry face;Wash/dry hands;Supervision/safety Where Assessed - Grooming: Unsupported sitting Upper Body Bathing: Performed;Right arm;Left arm;Chest;Abdomen;Supervision/safety;Set up Where Assessed - Upper Body Bathing: Unsupported sitting Lower Body Bathing: Performed;Supervision/safety;Set up Where Assessed - Lower Body Bathing: Unsupported sitting;Supported sit to stand Upper Body Dressing: Supervision/safety;Set up Where Assessed - Upper Body Dressing: Unsupported sitting Toilet Transfer: Minimal assistance Toilet Transfer Method: Sit to stand Toilet Transfer Equipment: Bedside commode Equipment Used: Gait belt;Rolling walker Transfers/Ambulation Related to ADLs: min A RW level ADL Comments: Focus on assessing O2 levels during ADL. Pt participated in ADLs for 15 min. on room air with O2 sats >90. Asymptomatic    OT Diagnosis:    OT Problem List:   OT Treatment Interventions:     OT Goals Acute Rehab OT Goals OT Goal Formulation: With patient Time For Goal  Achievement: 07/09/12 Potential to Achieve Goals: Good ADL Goals Pt Will Perform Upper Body Bathing: with set-up;Sitting, edge of bed ADL Goal: Upper Body Bathing - Progress: Met Pt Will Perform Lower Body Bathing: with min assist;Sit to stand from bed;Supported;with cueing (comment type and amount) ADL Goal: Lower Body Bathing - Progress: Met Pt Will Perform Upper Body Dressing: with set-up;Sitting, bed;Unsupported ADL Goal: Upper Body Dressing - Progress: Met Pt Will Perform Lower Body Dressing: with min assist;Supported;with cueing (comment type and amount);Sit to stand from bed ADL Goal: Lower Body Dressing - Progress: Progressing toward goals Pt Will Transfer to Toilet: with supervision;with DME;3-in-1 ADL Goal: Toilet Transfer - Progress: Progressing toward goals Pt Will Perform Toileting - Clothing Manipulation: with supervision;Sitting on 3-in-1 or toilet;Standing ADL Goal: Toileting - Clothing Manipulation - Progress: Progressing toward goals Pt Will Perform Toileting - Hygiene: with modified independence;Standing at 3-in-1/toilet;Sit to stand from 3-in-1/toilet ADL Goal: Toileting - Hygiene - Progress: Progressing toward goals  Visit Information  Last OT Received On: 07/03/12 Assistance Needed: +1    Subjective Data      Prior Functioning       Cognition  Overall Cognitive Status: Appears within functional limits for tasks assessed/performed Area of Impairment: Memory Arousal/Alertness: Awake/alert Orientation Level: Appears intact for tasks assessed Behavior During Session: [facility] for tasks performed Memory:  (Pt. recalled safety precautions for transfers.)    Mobility  Shoulder Instructions Bed Mobility Bed Mobility: Supine to Sit;Sitting - Scoot to Edge of Bed Supine to Sit: 5: Supervision;HOB elevated;With rails Sitting - Scoot to Edge of Bed: 5: Supervision;With rail Transfers Transfers: Sit to Stand;Stand to Sit Sit to Stand: 4: Min guard;With  upper extremity  assist;From bed;From chair/3-in-1 Stand to Sit: 4: Min guard;With upper extremity assist;To chair/3-in-1 Details for Transfer Assistance: vc for Pt to control safe descent        Exercises   General UB theraband   Balance  min guard   End of Session OT - End of Session Equipment Utilized During Treatment: Gait belt Activity Tolerance: Patient tolerated treatment well Patient left: in chair;with call bell/phone within reach Nurse Communication: Other (comment) (need to check O2 stats)  GO     Floye Fesler,HILLARY 07/03/2012, 2:26 PM [facility], OTR/L  [phone] 07/03/2012

## 2012-07-03 NOTE — Progress Notes (Addendum)
Chaplain had a delightful visit with patient and her family...wonderful people.  Prayed with patient and her family before I left...they requested prayer even though they are Jewish and knew I am a [place]. I felt so honored to spend time with this patient and her family and to pray with them.

## 2012-07-04 ENCOUNTER — Inpatient Hospital Stay (HOSPITAL_COMMUNITY): Payer: Medicare Other

## 2012-07-04 MED ORDER — DARBEPOETIN ALFA-POLYSORBATE 100 MCG/0.5ML IJ SOLN: 100.0000 ug | INTRAMUSCULAR | Status: DC

## 2012-07-04 MED ORDER — PARICALCITOL 5 MCG/ML IV SOLN
INTRAVENOUS | Status: AC
  Administered 2012-07-04: 1 ug via INTRAVENOUS
  Filled 2012-07-04: qty 1

## 2012-07-04 MED ORDER — PARICALCITOL 5 MCG/ML IV SOLN: 1.0000 ug | INTRAVENOUS | Status: DC

## 2012-07-04 NOTE — Clinical Social Work Note (Signed)
Patient medically stable for discharge to [facility] facility today, prior to returning to her independent living apartment. CSW facilitated transport via ambulance. Family aware of discharge.  Genelle Bal, MSW, LCSW [phone]

## 2012-07-04 NOTE — Progress Notes (Signed)
Report called to Duwayne Heck, LPN at [facility].

## 2012-07-04 NOTE — Progress Notes (Signed)
Subjective:   Seen on HD, no current complaints.  Objective: Vital signs in last 24 hours: Temp:  [96 F (35.6 C)-98.4 F (36.9 C)] 96 F (35.6 C) (11/07 0530) Pulse Rate:  [65-87] 78  (11/07 0731) Resp:  [13-19] 18  (11/07 0731) BP: (82-160)/(43-76) 121/64 mmHg (11/07 0731) SpO2:  [92 %-98 %] 97 % (11/07 0530) Weight:  [53.5 kg (117 lb 15.1 oz)-54.6 kg (120 lb 5.9 oz)] 54 kg (119 lb 0.8 oz) (11/07 0530) Weight change: 0.9 kg (1 lb 15.8 oz)  Intake/Output from previous day: 11/06 0701 - 11/07 0700 In: 420 [P.O.:420] Out: 1738 [Urine:20]   EXAM: General appearance:  Alert, in no apparent distress Resp:  CRA without rales, rhonchi, or wheezes Cardio:  RRR with Gr III/VI systolic murmur GI:  + BS, soft and nontender Extremities:  No edema Access:  Right IJ catheter with BFR 400 cc/min, also maturing RUA AVF with + bruit  Lab Results:  [facility] 07/03/12 0713 07/02/12 0635  WBC 16.0* 14.1*  HGB 10.4* 10.3*  HCT 33.5* 32.9*  PLT 352 327   BMET:  Basename 07/03/12 0713 07/02/12 0635  NA 130* 131*  K 3.8 4.3  CL 91* 93*  CO2 29 29  GLUCOSE 71 72  BUN 29* 34*  CREATININE 2.50* 3.10*  CALCIUM 9.4 9.2  ALBUMIN 2.2* 2.1*   No results found for this basename: PTH:2 in the last 72 hours Iron Studies: No results found for this basename: IRON,TIBC,TRANSFERRIN,FERRITIN in the last 72 hours  Dialysis Orders: Center: [facility] on TTS Optiflux 180 (kt/v's >2 could use 160)  EDW 58.5 HD Bath 2K 2.25 Ca Time 4 Heparin 4000 with 1000 mid treatment. Access right I-J and maturing right upper AVF BFR 400 DFR A 1.5 Zemplar 1 mcg IV/HD Epogen 6000 Units IV/HD Venofer None - s/p full course Venofer with last dose 10/24.  Assessment/Plan: 1. Pneumonia - Now on Levaquin 500 mg PO qod 2. Severe AS/vol excess/pleural effusions- weight down from 58 to around 52 kg. Significant BP drop during yesterday's treatment.  I think we will have reached the limit of ultrafiltration with today's dialysis. She  should be tested for RA hypoxemia in case home oxygen is needed. Would suggest OK for d/c after today's dialysis.  3. ESRD - HD on TTS @ [facility]; s/p HD on 10/31, 11/1, 11/2, 11/4, 11/5, & 11/6; AVF maturation procedure postponed so that her son may take her.  4. HTN/Volume - BP currently 121/64, multiple Txs to remove fluid, lowest wt 53.5 kg yesterday. UF goal of 2.5 L today.  5. Anemia - Hgb 10.4 on Aranesp 100 mcg on Tues. 6. Metabolic bone disease - Ca 9.4 (10.8 corrected), P 3.4, on Zemplar 1 mcg. 7. Nutrition - Alb 2.2, renal diet. 8. EOL- discussed code status with patient. She requests no heroic measures, CPR, life support, etc; now DNR. We discussed the fact that her health is failing and that the fluid will likely reaccumalate with her heart disease and she is aware that at any time she chooses she may withdraw from dialysis and we will provide comfort care.    LOS: 10 days   LYLES,CHARLES 07/04/2012,7:46 AM  Patient seen and examined and agree with assessment and plan as above with additions as indicated.  Vinson Moselle  MD [facility] [phone] pgr    [phone] cell 07/04/2012, 11:37 AM

## 2012-07-04 NOTE — Discharge Summary (Signed)
Physician Discharge Summary  NAME:Miranda [name]  ZOX:[id number]  DOB: 1922-08-10   Admit date: 06/24/2012 Discharge date: 07/04/2012  Admitting Diagnosis: shortness of breath and pneumonia  Discharge Diagnoses:  Principal Problem:  *Pneumonia - probable, treated with Levaquin x8days Active Problems:  Anemia associated with chronic renal failure  Pulmonary edema and volume overload related to ESRD and severe Aortic stenosis  End stage renal disease  Hypoxemia  Gout  Severe aortic stenosis  Pleural effusion - improved   Discharge Condition: improved  Hospital Course: Patient admitted with dyspnea. She was initially thought to have pneumonia and was started on abx. However, over time, it became more apparent that the bigger problem was volume overload related to ESRD. AoS limits ability to mobilize fluid by dialysis so daily dialysis done. This resulted in improvement in oxygenation and she was able to stop oxygen prior to d/c (O2 sats > 93% with ambulation with PT).   She completed her abx in hospital. She is ready to resume her tiw (TTS) dialysis treatments.    Dr. Seth Bake discussed code status and prognosis with patient. She expressed desires to be at DNR status at this time. Dialysis and other aggressive treatment is to continue.   Consults: Renal. Pulmonary  Disposition: SNF  Discharge Orders    Future Orders Please Complete By Expires   Diet - low sodium heart healthy      Increase activity slowly      Comments:   Work with OT and PT to improve functional status to get to independent living       Medication List     As of 07/04/2012  7:37 AM    TAKE these medications         acetaminophen 500 MG tablet   Commonly known as: TYLENOL   Take 500 mg by mouth every 6 (six) hours as needed. For pain      allopurinol 100 MG tablet   Commonly known as: ZYLOPRIM   Take 100 mg by mouth 2 (two) times daily.      aspirin EC 81 MG tablet   Take 81 mg by mouth daily.     atorvastatin 40 MG tablet   Commonly known as: LIPITOR   Take 40 mg by mouth daily.      calcium carbonate 600 MG Tabs   Commonly known as: OS-CAL   Take 600 mg by mouth 2 (two) times daily with a meal.      CERTAVITE SENIOR/ANTIOXIDANT PO   Take by mouth daily.      darbepoetin 100 MCG/0.5ML Soln   Commonly known as: ARANESP   Inject 0.5 mLs (100 mcg total) into the vein every Tuesday with hemodialysis.      HYDROcodone-acetaminophen 5-500 MG per tablet   Commonly known as: VICODIN   Take 0.5 tablets by mouth every 6 (six) hours as needed. For pain      pantoprazole 40 MG tablet   Commonly known as: PROTONIX   Take 40 mg by mouth daily.      paricalcitol 5 MCG/ML injection   Commonly known as: ZEMPLAR   Inject 0.2 mLs (1 mcg total) into the vein 3 (three) times a week.      predniSONE 5 MG tablet   Commonly known as: DELTASONE   Take 5 mg by mouth daily.      sertraline 50 MG tablet   Commonly known as: ZOLOFT   Take 50 mg by mouth daily.  Follow-up Information    Follow up with Darnelle Bos, MD. (contact us when you move back to independent living and we will make a followup appt for you)    Contact information:   [address], [facility], [place] [address] [phone]          Things to follow up in the outpatient setting: None  Time coordinating discharge: 20 minutes including medication reconciliation,  Preparation of discharge papers, and discussion with patient and family    Signed: Darnelle Bos 07/04/2012, 7:37 AM

## 2012-07-18 ENCOUNTER — Ambulatory Visit: Payer: Medicare Other | Admitting: Oncology

## 2012-07-18 ENCOUNTER — Ambulatory Visit: Payer: Medicare Other

## 2012-07-19 ENCOUNTER — Other Ambulatory Visit: Payer: Medicare Other | Admitting: Lab

## 2012-08-12 ENCOUNTER — Other Ambulatory Visit: Payer: Self-pay | Admitting: Dermatology

## 2012-08-15 ENCOUNTER — Ambulatory Visit: Payer: Medicare Other

## 2012-09-12 ENCOUNTER — Ambulatory Visit: Payer: Medicare Other

## 2012-10-09 ENCOUNTER — Encounter: Payer: Self-pay | Admitting: Vascular Surgery

## 2012-10-11 ENCOUNTER — Ambulatory Visit: Payer: Medicare Other | Admitting: Vascular Surgery

## 2012-10-17 ENCOUNTER — Ambulatory Visit: Payer: Medicare Other

## 2012-10-18 ENCOUNTER — Ambulatory Visit: Payer: Medicare Other | Admitting: Vascular Surgery

## 2012-11-14 ENCOUNTER — Encounter: Payer: Self-pay | Admitting: Vascular Surgery

## 2012-11-14 ENCOUNTER — Ambulatory Visit: Payer: Medicare Other

## 2012-11-15 ENCOUNTER — Encounter: Payer: Self-pay | Admitting: Vascular Surgery

## 2012-11-15 ENCOUNTER — Ambulatory Visit (INDEPENDENT_AMBULATORY_CARE_PROVIDER_SITE_OTHER): Payer: Medicare Other | Admitting: Vascular Surgery

## 2012-11-15 VITALS — BP 184/86 | HR 87 | Ht 60.0 in | Wt 117.0 lb

## 2012-11-15 DIAGNOSIS — N186 End stage renal disease: Secondary | ICD-10-CM

## 2012-11-15 NOTE — Progress Notes (Signed)
[facility] [place]  Established Dialysis Access  History of Present Illness  Miranda [name] is a 79 y.o. (25-Oct-1921) female who presents for re-evaluation for permanent access.  Her previous R BC AVF needs likely venoplasty to improve flow.  The issue is her aortic stenosis is so severe that it interferes with her HD.  I had expressed concerns that a fully functional fistula would be too much for her heart to manage.  After discussion with Dr. Eliott Nine, we agreed she would like be able to dialyze from a RUA AVG with a 4 mm to 7 mm tapered graft to limit the flow rate in the graft.  The patient denies any complications with her tunneled dialysis catheter.  At this point, she would prefer to continue dialyzing with her tunneled dialysis catheter.  Past Medical History, Past Surgical History, Social History, Family History, Medications, Allergies, and Review of Systems are unchanged from previous visit on 06/14/12.  Physical Examination  Filed Vitals:   11/15/12 0857  BP: 184/86  Pulse: 87  Height: 5' (1.524 m)  Weight: 117 lb (53.071 kg)  SpO2: 93%   Body mass index is 22.85 kg/(m^2).  General: A&O x 3, elderly, cachectic  Pulmonary: Sym exp, good air movt, CTAB, no rales, rhonchi, & wheezing  Cardiac: RRR, Nl S1, S2, no Murmurs, rubs or gallops  Gastrointestinal: soft, NTND, -G/R, - HSM, - masses, - CVAT B  Musculoskeletal: M/S 5/5 throughout , Extremities without  ischemic changes , patent R BC AVF  Neurologic: Pain and light touch intact in extremities , Motor exam as listed above  Medical Decision Making  Miranda [name] is a 79 y.o. female who presents with ESRD requiring hemodialysis.   Based on vein mapping and examination, this patient's permanent access options include: RUA AVG with tapered 4 mm to 7 mm graft  We discussed the risks and benefits of the AVG and also the TDC.  The patient prefers to continuing HD via her [facility] at this point.  We are  available to place the AVG if the patient changes her mind at some point.  Leonides Sake, MD [facility] [place] Office: [phone] Pager: [phone]  11/15/2012, 10:24 AM

## 2012-12-09 ENCOUNTER — Telehealth: Payer: Self-pay | Admitting: Cardiovascular Disease

## 2012-12-09 DIAGNOSIS — I350 Nonrheumatic aortic (valve) stenosis: Secondary | ICD-10-CM

## 2012-12-09 NOTE — Telephone Encounter (Signed)
Lengthy discussion with the patient's son who is a physician. The patient is 79 years old and has severe aortic stenosis. She has end-stage renal disease and her dialysis has been markedly limited, possibly because of aortic stenosis. I reviewed the patient's chart and discussed her case in depth. Plan to bring her in for an office evaluation with a repeat echocardiogram the day of her office visit. We discussed limitations of TAVR in the elderly end-stage renal disease population. Despite that, her quality of life is very poor at present and we will explore whether she may be a candidate for either palliative balloon valvuloplasty or TAVR.  Tonny Bollman 12/09/2012 11:01 AM

## 2012-12-10 NOTE — Telephone Encounter (Signed)
Appointments scheduled on 12/17/12. FYINeliah Cuyler- [phone] is his cell or [phone] is his home.

## 2012-12-17 ENCOUNTER — Ambulatory Visit: Payer: Medicare Other | Admitting: Cardiovascular Disease

## 2012-12-17 ENCOUNTER — Other Ambulatory Visit: Payer: Self-pay

## 2012-12-17 ENCOUNTER — Encounter: Payer: Self-pay | Admitting: Cardiovascular Disease

## 2012-12-17 ENCOUNTER — Ambulatory Visit (HOSPITAL_COMMUNITY): Payer: Medicare Other | Attending: Cardiovascular Disease | Admitting: Radiology

## 2012-12-17 ENCOUNTER — Ambulatory Visit (INDEPENDENT_AMBULATORY_CARE_PROVIDER_SITE_OTHER): Payer: Medicare Other | Admitting: Cardiovascular Disease

## 2012-12-17 VITALS — BP 114/78 | HR 74 | Ht 60.0 in | Wt 109.0 lb

## 2012-12-17 DIAGNOSIS — I080 Rheumatic disorders of both mitral and aortic valves: Secondary | ICD-10-CM | POA: Insufficient documentation

## 2012-12-17 DIAGNOSIS — C50919 Malignant neoplasm of unspecified site of unspecified female breast: Secondary | ICD-10-CM | POA: Insufficient documentation

## 2012-12-17 DIAGNOSIS — N186 End stage renal disease: Secondary | ICD-10-CM | POA: Insufficient documentation

## 2012-12-17 DIAGNOSIS — Z992 Dependence on renal dialysis: Secondary | ICD-10-CM | POA: Insufficient documentation

## 2012-12-17 DIAGNOSIS — I379 Nonrheumatic pulmonary valve disorder, unspecified: Secondary | ICD-10-CM | POA: Insufficient documentation

## 2012-12-17 DIAGNOSIS — I359 Nonrheumatic aortic valve disorder, unspecified: Secondary | ICD-10-CM

## 2012-12-17 DIAGNOSIS — I350 Nonrheumatic aortic (valve) stenosis: Secondary | ICD-10-CM

## 2012-12-17 DIAGNOSIS — I120 Hypertensive chronic kidney disease with stage 5 chronic kidney disease or end stage renal disease: Secondary | ICD-10-CM | POA: Insufficient documentation

## 2012-12-17 DIAGNOSIS — I079 Rheumatic tricuspid valve disease, unspecified: Secondary | ICD-10-CM | POA: Insufficient documentation

## 2012-12-17 NOTE — Progress Notes (Signed)
Echocardiogram performed.  

## 2012-12-17 NOTE — Patient Instructions (Addendum)
Please call if you decide to proceed with further evaluation.

## 2012-12-18 ENCOUNTER — Encounter: Payer: Self-pay | Admitting: Cardiovascular Disease

## 2012-12-18 NOTE — Progress Notes (Signed)
MULTIDISCIPLINARY HEART VALVE CLINIC NOTE  Patient ID: Miranda [name] MRN: [id number] DOB/AGE: May 09, 1922 79 y.o.  Primary Care Physician:OSBORNE,JAMES CHARLES, MD Primary Cardiologist: Ellis Parents Excell Seltzer)  HPI: 13 year-old woman presenting for initial cardiac evaluation. The patient has a long history of a heart murmur, and has now developed significant aortic stenosis. She presents today for evaluation of aortic stenosis.  She lives locally with her husband. 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 They come in with one of their sons today. Another son, Casimiro Needle, who is a physician, was conferenced into the appointment via cell phone.   The patient has mild shortness of breath with activity. She denies orthopnea, PND, or leg swelling. She denies chest pain or pressure. She has not had orthopnea, PND, lightheadedness, or syncope.   She is on home oxygen. She's become extremely weak and does very little walking. She walks with a walker for short distances in her home, but always uses a wheelchair when she's out. She is primarily limited by leg pain, back pain, and weakness. She denies limitation related to dyspnea.   She developed ESRD last year and has been on hemodialysis since that time. She dialyzes with a permacath because of problems with access.  Past Medical History  Diagnosis Date  . Chronic kidney disease   . Hypertension   . Heart murmur     Dr Particia Lather follows. Not followed by a Cardiologist  . Anemia   . Gout   . Cancer     Breast- Lumpectomy.  RadiationTx  . GERD (gastroesophageal reflux disease)     Past Surgical History  Procedure Laterality Date  . Parathyroidectomy    . Breast lumpectomy      left  . Eye surgery      Cataract eyes  . Appendectomy    . Av fistula placement  02/28/2012    Procedure: ARTERIOVENOUS (AV) FISTULA CREATION;  Surgeon: Fransisco Hertz, MD;  Location: [facility] OR;  Service: Vascular;  Laterality: Right;  Ultrasound guided.  . Insertion of dialysis catheter   03/12/2012    Procedure: INSERTION OF DIALYSIS CATHETER;  Surgeon: Chuck Hint, MD;  Location: [facility] OR;  Service: Vascular;  Laterality: N/A;  Right Internal Jugular Placement    Family History  Problem Relation Age of Onset  . Hypertension Mother   . Stroke Mother     History   Social History  . Marital Status: Married    Spouse Name: N/A    Number of Children: N/A  . Years of Education: N/A   Occupational History  . Not on file.   Social History Main Topics  . Smoking status: Never Smoker   . Smokeless tobacco: Never Used  . Alcohol Use: No  . Drug Use: No  . Sexually Active: Not on file   Other Topics Concern  . Not on file   Social History Narrative  . No narrative on file    Meds: Allopurinol 100 mg daily ASA 81 mg daily Atorvastatin 40 mg daily Vicodin as needed Multivitamin daily Protonix 40 mg daily Prednisone 5 mg daily Zoloft 50 mg daily   Allergies  Allergen Reactions  . Ace Inhibitors     Pt is not sure where the allergy came from   . Sulfa Antibiotics Other (See Comments)    Unknown.  "Long time ago"    ROS:  General: no fevers/chills/night sweats, positive for fatigue and weakness Eyes: no blurry vision, diplopia, or amaurosis ENT: no sore throat or hearing  loss Resp: no cough, wheezing, or hemoptysis CV: no edema or palpitations GI: no abdominal pain, nausea, vomiting, diarrhea, or constipation GU: no dysuria, frequency, or hematuria. Continues to make urine Skin: no rash, positive for easy bruising Neuro: no headache or history of stroke. Musculoskeletal: positive for joint/back pain Heme: no bleeding, DVT Endo: no polydipsia or polyuria  BP 114/78  Pulse 74  Ht 5' (1.524 m)  Wt 49.442 kg (109 lb)  BMI 21.29 kg/m2  SpO2 99%  PHYSICAL EXAM: Pt is alert and oriented, delightful, elderly woman, in no distress, frail-appearing HEENT: normal Neck: JVP normal. Carotid upstrokes normal with bilateral bruits. No  thyromegaly. Lungs: equal expansion, diminished breath sounds in bases CV: Apex is discrete and nondisplaced, RRR with grade 3/6 systolic murmur at the RUSB, LLSB Abd: soft, NT, +BS, no bruit, no hepatosplenomegaly Back: no CVA tenderness Ext: no C/C/E Skin: warm and dry without rash, diffuse ecchmoses Neuro: CNII-XII intact             Strength intact = bilaterally  2D ECHO:  Left ventricle: The cavity size was normal. Wall thickness was increased in a pattern of moderate LVH. Systolic function was moderately reduced. The estimated ejection fraction was in the range of 35% to 40%. Early diastolic septal annular tissue Doppler velocities Ea were abnormal.  ------------------------------------------------------------ Aortic valve: Moderately thickened, moderately calcified leaflets. Doppler: There was severe stenosis. Trivial regurgitation. VTI ratio of LVOT to aortic valve: 0.2. Valve area: 0.7cm^2(VTI). Indexed valve area: 0.47cm^2/m^2 (VTI). Peak velocity ratio of LVOT to aortic valve: 0.21. Valve area: 0.73cm^2 (Vmax). Indexed valve area: 0.49cm^2/m^2 (Vmax). Mean gradient: 36mm Hg (S). Peak gradient: 53mm Hg (S).  ------------------------------------------------------------ Aorta: Aortic root: The aortic root was normal in size. Ascending aorta: The ascending aorta was normal in size.  ------------------------------------------------------------ Mitral valve: Moderately calcified annulus. Doppler: Moderate regurgitation. Peak gradient: 5mm Hg (D).  ------------------------------------------------------------ Left atrium: The atrium was moderately dilated.  ------------------------------------------------------------ Right ventricle: The cavity size was normal. Systolic function was normal.  ------------------------------------------------------------ Pulmonic valve: Structurally normal valve. Cusp separation was normal. Doppler: Transvalvular velocity was within the  normal range. Trivial regurgitation.  ------------------------------------------------------------ Tricuspid valve: Structurally normal valve. Leaflet separation was normal. Doppler: Transvalvular velocity was within the normal range. Trivial regurgitation.  ------------------------------------------------------------ Right atrium: The atrium was at the upper limits of normal in size.  ------------------------------------------------------------ Pericardium: There was no pericardial effusion.  ------------------------------------------------------------  2D measurements Normal Doppler measurements Normal Left ventricle Main pulmonary LVID ED, 41.2 mm 43-52 artery chord, Pressure, 28 mm Hg =30 PLAX S LVID ES, 34.3 mm 23-38 Left ventricle chord, Ea, lat 6.25 cm/s ------ PLAX ann, tiss FS, chord, 17 % >29 DP PLAX E/Ea, lat 18.0 ------ LVPW, ED 14.8 mm ------ ann, tiss 8 IVS/LVPW 1.2 <1.3 DP ratio, ED Ea, med 4.72 cm/s ------ Ventricular septum ann, tiss IVS, ED 17.8 mm ------ DP LVOT E/Ea, med 23.9 ------ Diam, S 21 mm ------ ann, tiss 4 Area 3.46 cm^2 ------ DP Diam 21 mm ------ LVOT Aorta Peak vel, 76.7 cm/s ------ Root diam, 29 mm ------ S ED VTI, S 17 cm ------ Left atrium Stroke vol 58.9 ml ------ AP dim 49 mm ------ Stroke 39.5 ml/m^2 ------ AP dim 3.29 cm/m^2 <2.2 index index Aortic valve Peak vel, 364 cm/s ------ S Mean vel, 287 cm/s ------ S VTI, S 83.9 cm ------ Mean 36 mm Hg ------ gradient, S Peak 53 mm Hg ------ gradient, S VTI ratio 0.2 ------ LVOT/AV Area, VTI 0.7 cm^2 ------  Area index 0.47 cm^2/m ------ (VTI) ^2 Peak vel 0.21 ------ ratio, LVOT/AV Area, Vmax 0.73 cm^2 ------ Area index 0.49 cm^2/m ------ (Vmax) ^2 Mitral valve Peak E vel 113 cm/s ------ Peak A vel 77.5 cm/s ------ Decelerati 113 ms 150-23 on time 0 Peak 5 mm Hg ------ gradient, D Peak E/A 1.5 ------ ratio Tricuspid valve Regurg 239 cm/s ------ peak vel Peak  RV-RA 23 mm Hg ------ gradient, S Systemic veins Estimated 5 mm Hg ------ CVP Right ventricle Pressure, 28 mm Hg <30 S Sa vel, 9.21 cm/s ------ lat ann, tiss DP  STS RISK CALCULATOR FOR ISOLATED AVR: Procedure Name Isolated AVRepl Risk of Mortality 25.056% Morbidity or Mortality 52.200% Long Length of Stay 38.201% Short Length of Stay 4.919% Permanent Stroke 5.847% Prolonged Ventilation 42.922% DSW Infection 0.392% Renal Failure N/A Reoperation 18.354%  ASSESSMENT AND PLAN:  79 year-old woman with severe symptomatic aortic stenosis. Her primary symptom has become intolerance of hemodialysis. She otherwise is primarily limited by chronic joint pain and weakness. We spent over 60 minutes in discussion today. We reviewed the implications of severe aortic stenosis as this disease process may be limiting the patient's ability to tolerate hemodialysis. We also discussed the poor prognosis associated with symptomatic aortic stenosis, especially in the setting of LV dysfunction. I personally reviewed her echo images and compared them to her previous study from 2013. LV function has clearly deteriorated in the past year. We reviewed potential treatment options, including aortic valve replacement, TAVR, and palliative medical therapy. I think her risk of any intervention is exceedingly high based on major comorbidities of ESRD, O2 dependence, advanced age, chronic steroid dependence, and frailty. If she were to purue treatment with TAVR, next steps would include cardiac CT and heart catheterization. Her family would like to discuss matters further. Her son has requested more information to help with decision-making and I am going to email him further data from clinical registries and trials. They may be interested in referral to a tertiary center and we had specific discussion about evaluation at [facility] which I would be happy to facilitate. Finally, if the patient decides to pursue  aggressive treatment, I recommended outpatient cardiac surgical consultation with Dr Cornelius Moras before we move forward with further testing. They are in agreement with this plan and will contact me if they decide to pursue further evaluation and treatment.  Tonny Bollman 12/18/2012 10:37 PM

## 2013-01-07 ENCOUNTER — Ambulatory Visit: Payer: Medicare Other | Admitting: Cardiovascular Disease

## 2013-02-11 ENCOUNTER — Ambulatory Visit
Admission: RE | Admit: 2013-02-11 | Discharge: 2013-02-11 | Disposition: A | Discharge status: Home / Self Care | Payer: Medicare Other | Source: Ambulatory Visit | Attending: Nephrology | Admitting: Nephrology

## 2013-02-11 ENCOUNTER — Other Ambulatory Visit: Payer: Self-pay | Admitting: Nephrology

## 2013-02-11 DIAGNOSIS — J90 Pleural effusion, not elsewhere classified: Secondary | ICD-10-CM

## 2013-08-11 IMAGING — CR DG CHEST 2V
2 series · 2 of 2 positions shown · non-contrast
Comparison: Portable chest x-ray of 03/12/2012

CLINICAL DATA: Hypoxemia, dialysis patient, shortness of breath

CHEST - 2 VIEW

[w chest lat]
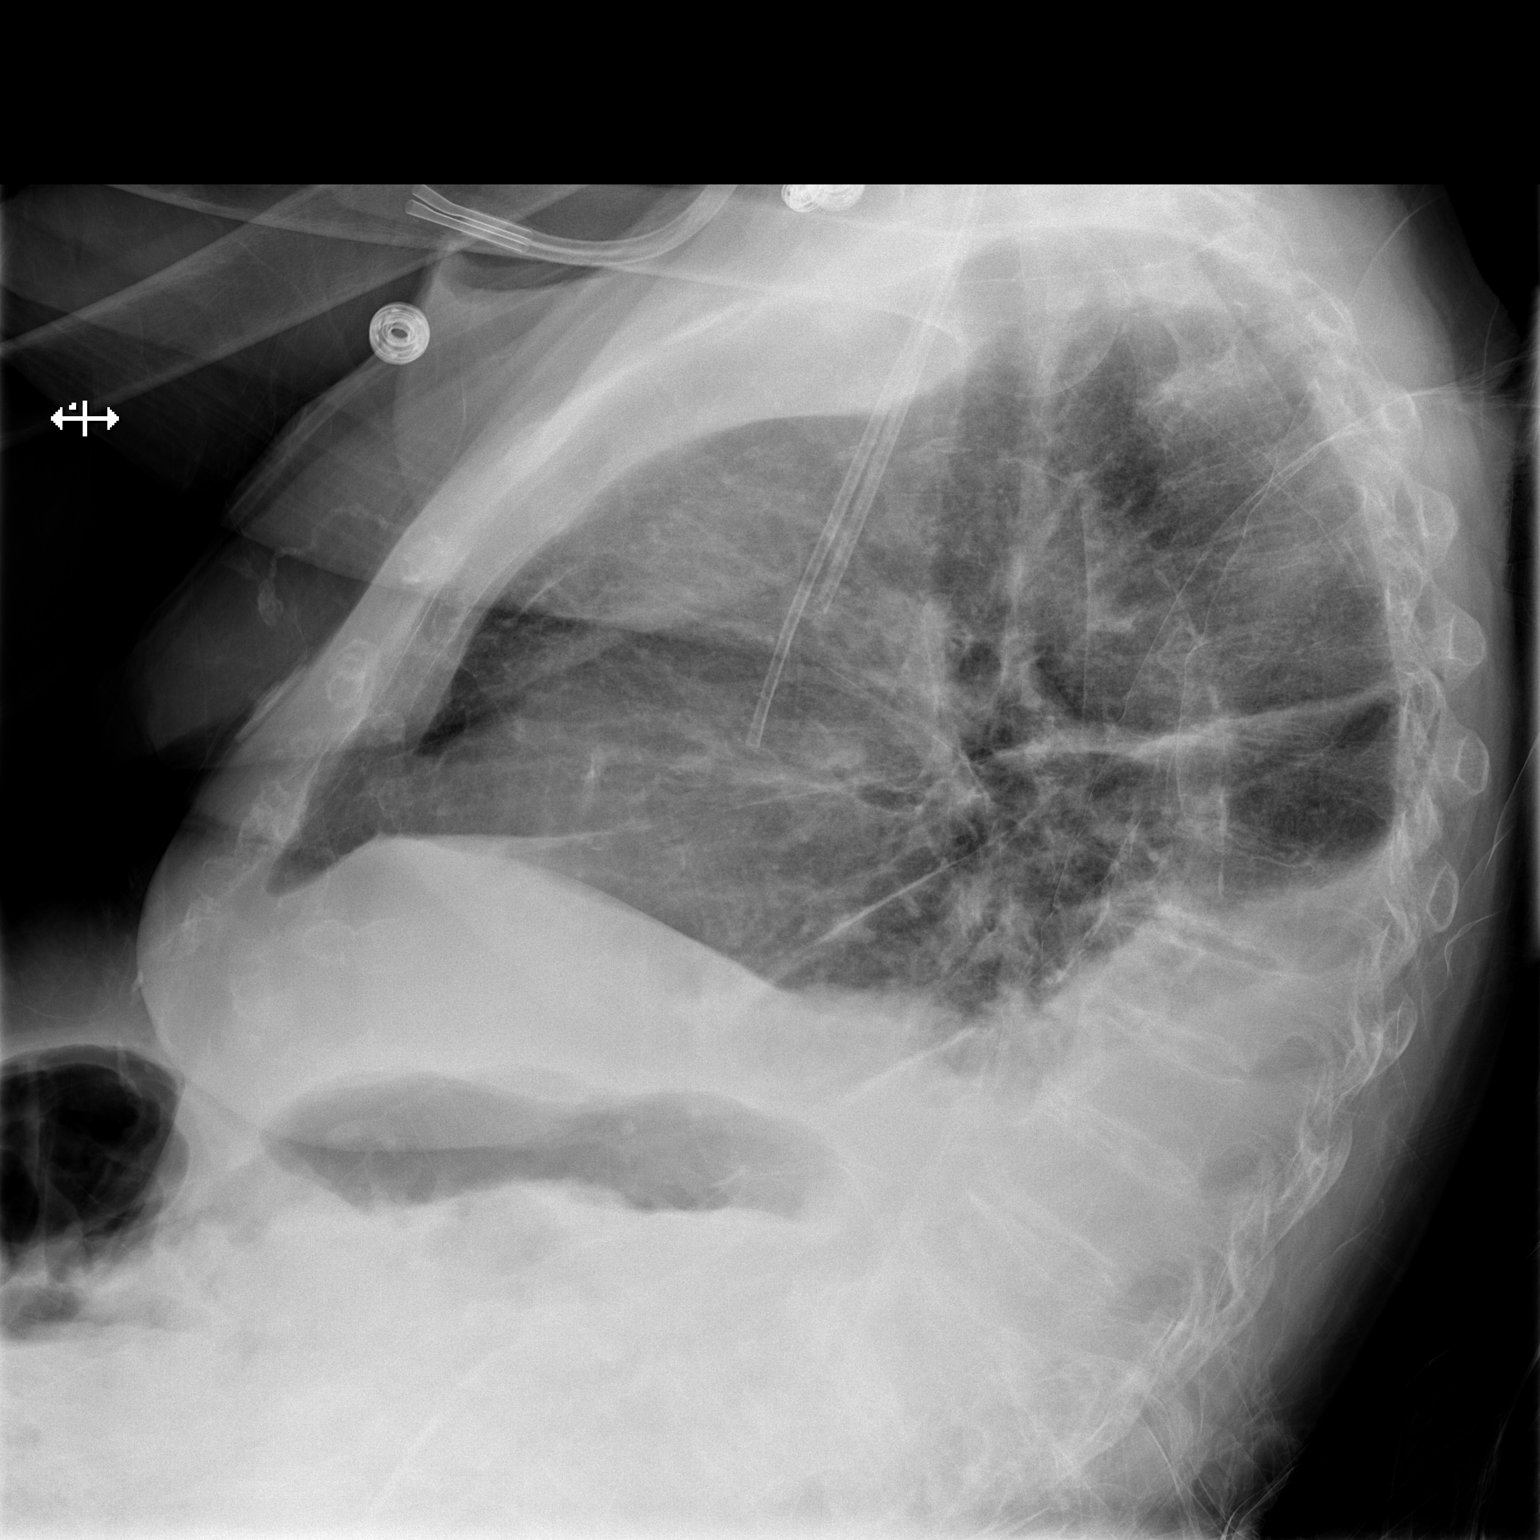

[x chest ap]
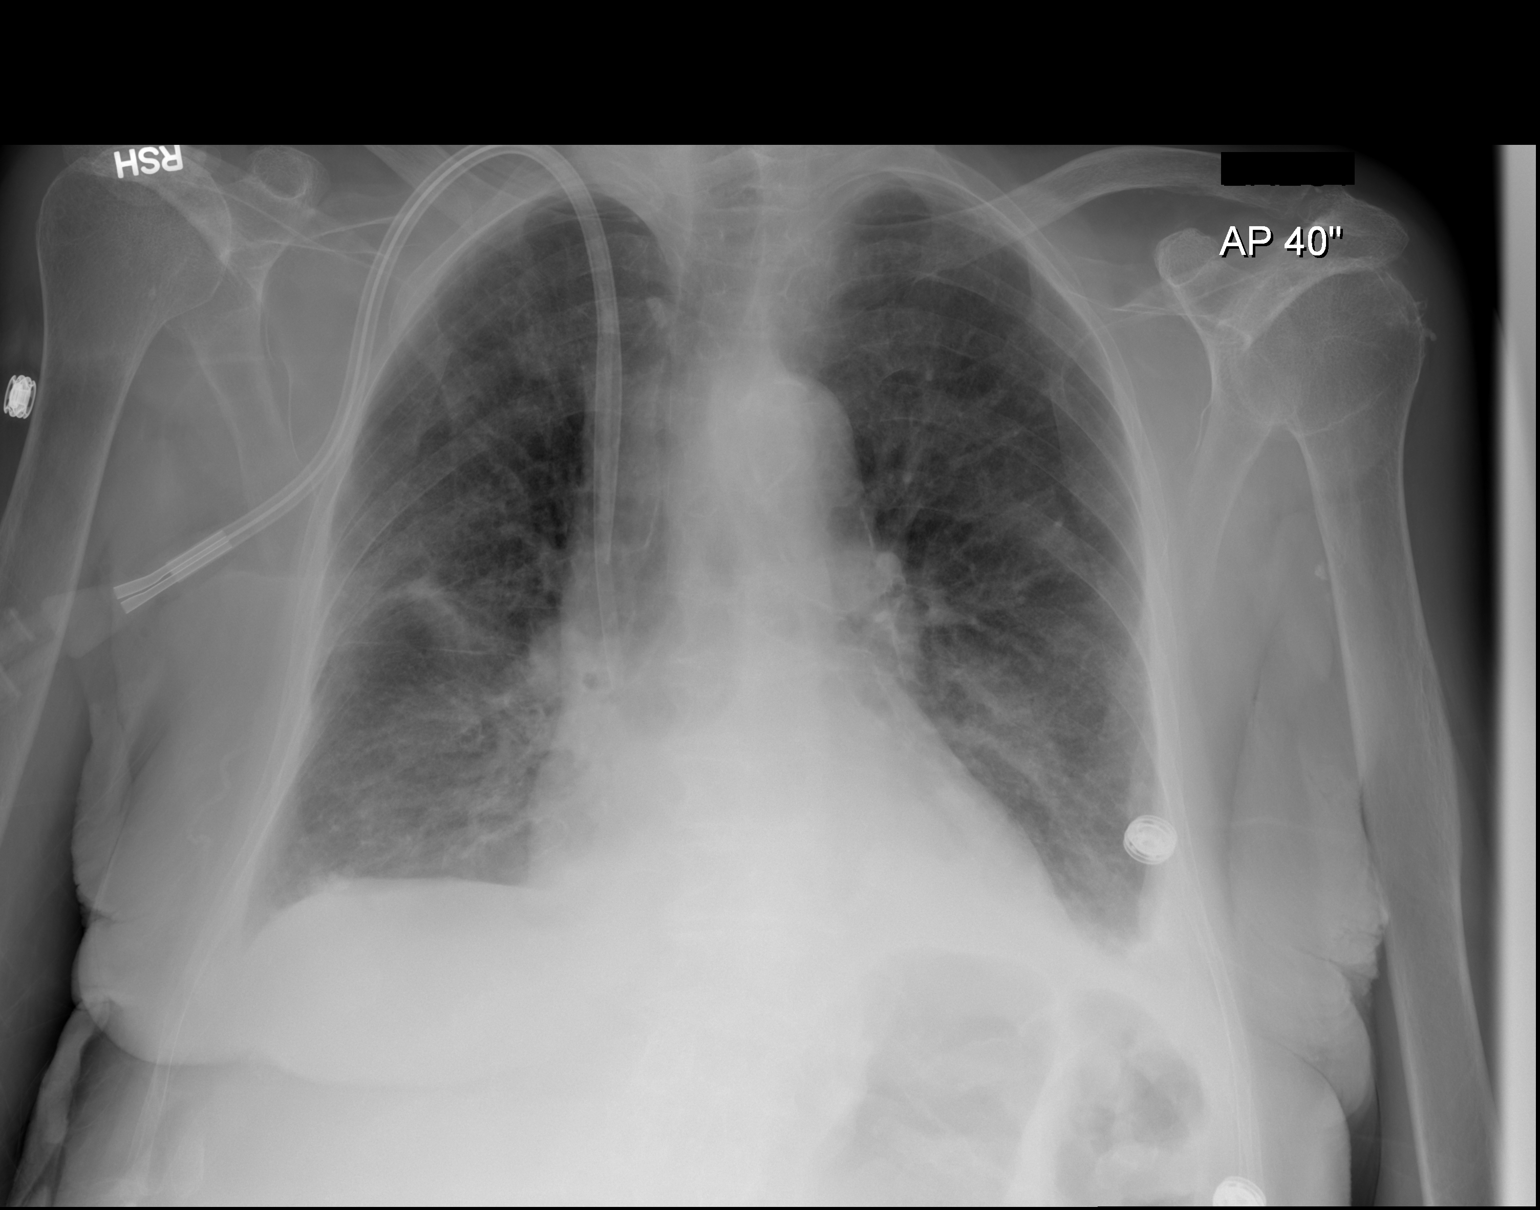

[2 of 2 positions shown; findings below may reference images not displayed]

FINDINGS: There has been some improvement in the interstitial edema
pattern.  There do appear to be a small pleural effusions present
left slightly larger than right.  Cardiomegaly is stable.  Central
venous line is unchanged.
IMPRESSION: Improvement in interstitial edema pattern.  Small effusions.

## 2013-08-13 ENCOUNTER — Emergency Department (HOSPITAL_COMMUNITY): Payer: Medicare Other

## 2013-08-13 ENCOUNTER — Encounter (HOSPITAL_COMMUNITY): Payer: Self-pay | Admitting: Emergency Medicine

## 2013-08-13 ENCOUNTER — Emergency Department (HOSPITAL_COMMUNITY)
Admission: EM | Admit: 2013-08-13 | Discharge: 2013-08-13 | Disposition: A | Discharge status: Home / Self Care | Payer: Medicare Other | Attending: Emergency Medicine | Admitting: Emergency Medicine

## 2013-08-13 DIAGNOSIS — IMO0002 Reserved for concepts with insufficient information to code with codable children: Secondary | ICD-10-CM | POA: Diagnosis not present

## 2013-08-13 DIAGNOSIS — Z7982 Long term (current) use of aspirin: Secondary | ICD-10-CM | POA: Insufficient documentation

## 2013-08-13 DIAGNOSIS — S0990XA Unspecified injury of head, initial encounter: Secondary | ICD-10-CM | POA: Insufficient documentation

## 2013-08-13 DIAGNOSIS — D649 Anemia, unspecified: Secondary | ICD-10-CM | POA: Insufficient documentation

## 2013-08-13 DIAGNOSIS — N189 Chronic kidney disease, unspecified: Secondary | ICD-10-CM | POA: Diagnosis not present

## 2013-08-13 DIAGNOSIS — K219 Gastro-esophageal reflux disease without esophagitis: Secondary | ICD-10-CM | POA: Insufficient documentation

## 2013-08-13 DIAGNOSIS — S01409A Unspecified open wound of unspecified cheek and temporomandibular area, initial encounter: Secondary | ICD-10-CM | POA: Diagnosis not present

## 2013-08-13 DIAGNOSIS — Y9389 Activity, other specified: Secondary | ICD-10-CM | POA: Diagnosis not present

## 2013-08-13 DIAGNOSIS — M109 Gout, unspecified: Secondary | ICD-10-CM | POA: Insufficient documentation

## 2013-08-13 DIAGNOSIS — R011 Cardiac murmur, unspecified: Secondary | ICD-10-CM | POA: Insufficient documentation

## 2013-08-13 DIAGNOSIS — Z79899 Other long term (current) drug therapy: Secondary | ICD-10-CM | POA: Diagnosis not present

## 2013-08-13 DIAGNOSIS — I129 Hypertensive chronic kidney disease with stage 1 through stage 4 chronic kidney disease, or unspecified chronic kidney disease: Secondary | ICD-10-CM | POA: Insufficient documentation

## 2013-08-13 DIAGNOSIS — Z992 Dependence on renal dialysis: Secondary | ICD-10-CM | POA: Insufficient documentation

## 2013-08-13 DIAGNOSIS — Y929 Unspecified place or not applicable: Secondary | ICD-10-CM | POA: Insufficient documentation

## 2013-08-13 MED ORDER — TETANUS-DIPHTH-ACELL PERTUSSIS 5-2.5-18.5 LF-MCG/0.5 IM SUSP
0.5000 mL | Freq: Once | INTRAMUSCULAR | Status: AC
  Administered 2013-08-13: 0.5 mL via INTRAMUSCULAR
  Filled 2013-08-13: qty 0.5

## 2013-08-13 NOTE — ED Notes (Signed)
Per [facility]- Pt resides at [facility] and nursing staff was placing pt onto [employer] [facility] lift in wheelchair and pt's face hit door post.  Resulting in 2-3 in laceration to rt side of face and 1 inch laceration to nose. NO LOC. No other complaints. Bleeding controlled. No pain

## 2013-08-13 NOTE — ED Notes (Addendum)
Attempted to leave message- got answering machine- did not leave message- could not verify pt as resident.  Family would give report.  Called [facility] at [place] [phone]

## 2013-08-13 NOTE — ED Notes (Signed)
Bed: WA24 Expected date:  Expected time:  Means of arrival:  Comments: EMS 

## 2013-08-13 NOTE — ED Notes (Signed)
MD at bedside. EDPA Robert at bs suturing

## 2013-08-13 NOTE — ED Notes (Signed)
Patient denies pain and is resting comfortably.  

## 2013-08-13 NOTE — ED Provider Notes (Signed)
CSN: [id number]     Arrival date & time 08/13/13  1200 History   First MD Initiated Contact with Patient 08/13/13 1229     Chief Complaint  Patient presents with  . Facial Laceration   (Consider location/radiation/quality/duration/timing/severity/associated sxs/prior Treatment) HPI Pt was going to dialysis today and while in her lift in her wheelchair her head got hit on the door frame. She denies LOC or any pain.    Review of Systems Physical Exam  Nursing note and vitals reviewed. Constitutional:  Non-toxic appearance. She does not appear ill. No distress.  Frail elderly femail  HENT:  Head: Normocephalic and atraumatic.  Right Ear: External ear normal.  Left Ear: External ear normal.  Nose: Nose normal. No mucosal edema or rhinorrhea.  Mouth/Throat: Oropharynx is clear and moist and mucous membranes are normal. No dental abscesses or uvula swelling.  Pt has laceration on her left cheek that appears to be a skin tear, but may be deeper underneath. Has a small laceration on the bridge of her nose and bruising around the right side of her mouth. She does not have pain when she bites down. See picture.   Eyes: Conjunctivae and EOM are normal. Pupils are equal, round, and reactive to light.  Musculoskeletal: Normal range of motion.        Procedures    Medical screening examination/treatment/procedure(s) were conducted as a shared visit with non-physician practitioner(s) and myself.  I personally evaluated the patient during the encounter.   Devoria Albe, MD, Armando Gang        Ward Givens, MD 08/13/13 [phone]

## 2013-08-13 NOTE — ED Notes (Signed)
MD at bedside. 

## 2013-08-13 NOTE — Discharge Instructions (Signed)
Laceration Care, Adult °A laceration is a cut or lesion that goes through all layers of the skin and into the tissue just beneath the skin. °TREATMENT  °Some lacerations may not require closure. Some lacerations may not be able to be closed due to an increased risk of infection. It is important to see your caregiver as soon as possible after an injury to minimize the risk of infection and maximize the opportunity for successful closure. °If closure is appropriate, pain medicines may be given, if needed. The wound will be cleaned to help prevent infection. Your caregiver will use stitches (sutures), staples, wound glue (adhesive), or skin adhesive strips to repair the laceration. These tools bring the skin edges together to allow for faster healing and a better cosmetic outcome. However, all wounds will heal with a scar. Once the wound has healed, scarring can be minimized by covering the wound with sunscreen during the day for 1 full year. °HOME CARE INSTRUCTIONS  °For sutures or staples: °· Keep the wound clean and dry. °· If you were given a bandage (dressing), you should change it at least once a day. Also, change the dressing if it becomes wet or dirty, or as directed by your caregiver. °· Wash the wound with soap and water 2 times a day. Rinse the wound off with water to remove all soap. Pat the wound dry with a clean towel. °· After cleaning, apply a thin layer of the antibiotic ointment as recommended by your caregiver. This will help prevent infection and keep the dressing from sticking. °· You may shower as usual after the first 24 hours. Do not soak the wound in water until the sutures are removed. °· Only take over-the-counter or prescription medicines for pain, discomfort, or fever as directed by your caregiver. °· Get your sutures or staples removed as directed by your caregiver. °For skin adhesive strips: °· Keep the wound clean and dry. °· Do not get the skin adhesive strips wet. You may bathe  carefully, using caution to keep the wound dry. °· If the wound gets wet, pat it dry with a clean towel. °· Skin adhesive strips will fall off on their own. You may trim the strips as the wound heals. Do not remove skin adhesive strips that are still stuck to the wound. They will fall off in time. °For wound adhesive: °· You may briefly wet your wound in the shower or bath. Do not soak or scrub the wound. Do not swim. Avoid periods of heavy perspiration until the skin adhesive has fallen off on its own. After showering or bathing, gently pat the wound dry with a clean towel. °· Do not apply liquid medicine, cream medicine, or ointment medicine to your wound while the skin adhesive is in place. This may loosen the film before your wound is healed. °· If a dressing is placed over the wound, be careful not to apply tape directly over the skin adhesive. This may cause the adhesive to be pulled off before the wound is healed. °· Avoid prolonged exposure to sunlight or tanning lamps while the skin adhesive is in place. Exposure to ultraviolet light in the first year will darken the scar. °· The skin adhesive will usually remain in place for 5 to 10 days, then naturally fall off the skin. Do not pick at the adhesive film. °You may need a tetanus shot if: °· You cannot remember when you had your last tetanus shot. °· You have never had a tetanus   shot. If you get a tetanus shot, your arm may swell, get red, and feel warm to the touch. This is common and not a problem. If you need a tetanus shot and you choose not to have one, there is a rare chance of getting tetanus. Sickness from tetanus can be serious. SEEK MEDICAL CARE IF:   You have redness, swelling, or increasing pain in the wound.  You see a red line that goes away from the wound.  You have yellowish-white fluid (pus) coming from the wound.  You have a fever.  You notice a bad smell coming from the wound or dressing.  Your wound breaks open before or  after sutures have been removed.  You notice something coming out of the wound such as wood or glass.  Your wound is on your hand or foot and you cannot move a finger or toe. SEEK IMMEDIATE MEDICAL CARE IF:   Your pain is not controlled with prescribed medicine.  You have severe swelling around the wound causing pain and numbness or a change in color in your arm, hand, leg, or foot.  Your wound splits open and starts bleeding.  You have worsening numbness, weakness, or loss of function of any joint around or beyond the wound.  You develop painful lumps near the wound or on the skin anywhere on your body. MAKE SURE YOU:   Understand these instructions.  Will watch your condition.  Will get help right away if you are not doing well or get worse. Document Released: 08/14/2005 Document Revised: 11/06/2011 Document Reviewed: 02/07/2011 [facility] Patient Information 2014 [place], [place]. Fall Prevention and Home Safety Falls cause injuries and can affect all age groups. It is possible to use preventive measures to significantly decrease the likelihood of falls. There are many simple measures which can make your home safer and prevent falls. OUTDOORS  Repair cracks and edges of walkways and driveways.  Remove high doorway thresholds.  Trim shrubbery on the main path into your home.  Have good outside lighting.  Clear walkways of tools, rocks, debris, and clutter.  Check that handrails are not broken and are securely fastened. Both sides of steps should have handrails.  Have leaves, snow, and ice cleared regularly.  Use sand or salt on walkways during winter months.  In the garage, clean up grease or oil spills. BATHROOM  Install night lights.  Install grab bars by the toilet and in the tub and shower.  Use non-skid mats or decals in the tub or shower.  Place a plastic non-slip stool in the shower to sit on, if needed.  Keep floors dry and clean up all water on the  floor immediately.  Remove soap buildup in the tub or shower on a regular basis.  Secure bath mats with non-slip, double-sided rug tape.  Remove throw rugs and tripping hazards from the floors. BEDROOMS  Install night lights.  Make sure a bedside light is easy to reach.  Do not use oversized bedding.  Keep a telephone by your bedside.  Have a firm chair with side arms to use for getting dressed.  Remove throw rugs and tripping hazards from the floor. KITCHEN  Keep handles on pots and pans turned toward the center of the stove. Use back burners when possible.  Clean up spills quickly and allow time for drying.  Avoid walking on wet floors.  Avoid hot utensils and knives.  Position shelves so they are not too high or low.  Place commonly used  objects within easy reach.  If necessary, use a sturdy step stool with a grab bar when reaching.  Keep electrical cables out of the way.  Do not use floor polish or wax that makes floors slippery. If you must use wax, use non-skid floor wax.  Remove throw rugs and tripping hazards from the floor. STAIRWAYS  Never leave objects on stairs.  Place handrails on both sides of stairways and use them. Fix any loose handrails. Make sure handrails on both sides of the stairways are as long as the stairs.  Check carpeting to make sure it is firmly attached along stairs. Make repairs to worn or loose carpet promptly.  Avoid placing throw rugs at the top or bottom of stairways, or properly secure the rug with carpet tape to prevent slippage. Get rid of throw rugs, if possible.  Have an electrician put in a light switch at the top and bottom of the stairs. OTHER FALL PREVENTION TIPS  Wear low-heel or rubber-soled shoes that are supportive and fit well. Wear closed toe shoes.  When using a stepladder, make sure it is fully opened and both spreaders are firmly locked. Do not climb a closed stepladder.  Add color or contrast paint or tape  to grab bars and handrails in your home. Place contrasting color strips on first and last steps.  Learn and use mobility aids as needed. Install an electrical emergency response system.  Turn on lights to avoid dark areas. Replace light bulbs that burn out immediately. Get light switches that glow.  Arrange furniture to create clear pathways. Keep furniture in the same place.  Firmly attach carpet with non-skid or double-sided tape.  Eliminate uneven floor surfaces.  Select a carpet pattern that does not visually hide the edge of steps.  Be aware of all pets. OTHER HOME SAFETY TIPS  Set the water temperature for 120 F (48.8 C).  Keep emergency numbers on or near the telephone.  Keep smoke detectors on every level of the home and near sleeping areas. Document Released: 08/04/2002 Document Revised: 02/13/2012 Document Reviewed: 11/03/2011 [facility] Patient Information 2014 [place], [place].

## 2013-08-13 NOTE — ED Notes (Addendum)
Pt reports missed her dialysis appt due to injury. Pt has moderate bruising to rt side of face and nose. Swelling also noted. EMS dressing removed and ice pack applied bleeding controlled.  Pt denies LOC. Family requesting neck evaluation. Pt neuro intact

## 2013-08-13 NOTE — ED Notes (Signed)
MD at bedside.  EDPA Robert in to see pt

## 2013-08-13 NOTE — ED Provider Notes (Signed)
CSN: [id number]     Arrival date & time 08/13/13  1200 History   First MD Initiated Contact with Patient 08/13/13 1229     Chief Complaint  Patient presents with  . Facial Laceration   (Consider location/radiation/quality/duration/timing/severity/associated sxs/prior Treatment) HPI Comments: Patient presents to the ED with a chief complaint of laceration.  Patient states that she stays at [facility] and was being transported to dialysis when a malfunction of the wheel chair lift occurred while getting into the transport vehicle.  The left kept rising, and pushed her face into the door frame.  She reports mild pain to the right cheek and to the bridge of the nose.  Bleeding is controlled.  Last tetanus shot is unknown.  She has tried using ice and gauze on the wound with some relief.  She does not take any blood thinners.  She is a dialysis patient, and is on a MWF schedule.  The history is provided by the patient. No language interpreter was used.    Past Medical History  Diagnosis Date  . Chronic kidney disease   . Hypertension   . Heart murmur     Dr Particia Lather follows. Not followed by a Cardiologist  . Anemia   . Gout   . Cancer     Breast- Lumpectomy.  RadiationTx  . GERD (gastroesophageal reflux disease)    Past Surgical History  Procedure Laterality Date  . Parathyroidectomy    . Breast lumpectomy      left  . Eye surgery      Cataract eyes  . Appendectomy    . Av fistula placement  02/28/2012    Procedure: ARTERIOVENOUS (AV) FISTULA CREATION;  Surgeon: Fransisco Hertz, MD;  Location: [facility] OR;  Service: Vascular;  Laterality: Right;  Ultrasound guided.  . Insertion of dialysis catheter  03/12/2012    Procedure: INSERTION OF DIALYSIS CATHETER;  Surgeon: Chuck Hint, MD;  Location: [facility] OR;  Service: Vascular;  Laterality: N/A;  Right Internal Jugular Placement   Family History  Problem Relation Age of Onset  . Hypertension Mother   . Stroke Mother    History   Substance Use Topics  . Smoking status: Never Smoker   . Smokeless tobacco: Never Used  . Alcohol Use: No   OB History   Grav Para Term Preterm Abortions TAB SAB Ect Mult Living                 Review of Systems  All other systems reviewed and are negative.    Allergies  Ace inhibitors and Sulfa antibiotics  Home Medications   Current Outpatient Rx  Name  Route  Sig  Dispense  Refill  . allopurinol (ZYLOPRIM) 100 MG tablet   Oral   Take 100 mg by mouth 2 (two) times daily.         Marland Kitchen aspirin EC 81 MG tablet   Oral   Take 81 mg by mouth daily.         Marland Kitchen atorvastatin (LIPITOR) 40 MG tablet   Oral   Take 40 mg by mouth daily.          Marland Kitchen HYDROcodone-acetaminophen (VICODIN) 5-500 MG per tablet   Oral   Take 0.5 tablets by mouth every 6 (six) hours as needed. For pain         . Multiple Vitamins-Minerals (CERTAVITE SENIOR/ANTIOXIDANT PO)   Oral   Take by mouth daily.         . pantoprazole (  PROTONIX) 40 MG tablet   Oral   Take 40 mg by mouth daily.          . predniSONE (DELTASONE) 5 MG tablet   Oral   Take 5 mg by mouth daily.         . sertraline (ZOLOFT) 50 MG tablet   Oral   Take 100 mg by mouth daily.           BP 148/59  Pulse 74  Temp(Src) 98.1 F (36.7 C) (Oral)  Resp 16  SpO2 93% Physical Exam  Nursing note and vitals reviewed. Constitutional: She is oriented to person, place, and time. She appears well-developed and well-nourished.  HENT:  Head: Normocephalic and atraumatic.  Oropharynx and mouth are clear, no trauma, no jaw pain  Eyes: Conjunctivae and EOM are normal. Pupils are equal, round, and reactive to light.  Neck: Normal range of motion. Neck supple.  Cardiovascular: Normal rate and regular rhythm.  Exam reveals no gallop and no friction rub.   No murmur heard. Pulmonary/Chest: Effort normal and breath sounds normal. No respiratory distress. She has no wheezes. She has no rales. She exhibits no tenderness.  Port a  cath in right upper chest wall, no evidence of infection  Abdominal: Soft. She exhibits no distension.  Musculoskeletal: Normal range of motion. She exhibits no edema and no tenderness.  Neurological: She is alert and oriented to person, place, and time.  Skin: Skin is warm and dry.  Skin tear to right cheek, small cut to bridge of nose  Psychiatric: She has a normal mood and affect. Her behavior is normal. Judgment and thought content normal.    ED Course  Procedures (including critical care time) Labs Review Labs Reviewed - No data to display Imaging Review No results found.  EKG Interpretation   None     LACERATION REPAIR Performed by: Roxy Horseman Authorized by: Roxy Horseman Consent: Verbal consent obtained. Risks and benefits: risks, benefits and alternatives were discussed Consent given by: patient Patient identity confirmed: provided demographic data Prepped and Draped in normal sterile fashion Wound explored  Laceration Location: right cheek  Laceration Length: 3 cm  No Foreign Bodies seen or palpated  Anesthesia: local infiltration  Local anesthetic: lidocaine 2% without epinephrine  Anesthetic total: 3 ml  Irrigation method: syringe Amount of cleaning: standard  Skin closure: 5-0 vicryl and tegaderm  Number of sutures: 2  Technique: simple  Patient tolerance: Patient tolerated the procedure well with no immediate complications.    Results for orders placed during the hospital encounter of 06/24/12  CULTURE, BLOOD (ROUTINE X 2)      Result Value Range   Specimen Description BLOOD ARM LEFT     Special Requests BOTTLES DRAWN AEROBIC AND ANAEROBIC 10CC     Culture  Setup Time 06/25/2012 00:33     Culture NO GROWTH 5 DAYS     Report Status 07/01/2012 FINAL    CULTURE, BLOOD (ROUTINE X 2)      Result Value Range   Specimen Description BLOOD HAND LEFT     Special Requests BOTTLES DRAWN AEROBIC AND ANAEROBIC 10CC     Culture  Setup Time  06/25/2012 00:34     Culture       Value: STAPHYLOCOCCUS SPECIES (COAGULASE NEGATIVE)     Note: THE SIGNIFICANCE OF ISOLATING THIS ORGANISM FROM A SINGLE SET OF BLOOD CULTURES WHEN MULTIPLE SETS ARE DRAWN IS UNCERTAIN. PLEASE NOTIFY THE MICROBIOLOGY DEPARTMENT WITHIN ONE WEEK IF SPECIATION AND SENSITIVITIES  ARE REQUIRED.     Note: Gram Stain Report Called to,Read Back By and Verified With:  SYBIL Kathlene November  RN AT 1946 ON 06/25/2012 BY RHYNA   Report Status 06/27/2012 FINAL    BASIC METABOLIC PANEL      Result Value Range   Sodium 133 (*) 135 - 145 mEq/L   Potassium 3.4 (*) 3.5 - 5.1 mEq/L   Chloride 94 (*) 96 - 112 mEq/L   CO2 29  19 - 32 mEq/L   Glucose, Bld 102 (*) 70 - 99 mg/dL   BUN 23  6 - 23 mg/dL   Creatinine, Ser 1.61 (*) 0.50 - 1.10 mg/dL   Calcium 9.1  8.4 - 09.6 mg/dL   GFR calc non Af Amer 13 (*) >90 mL/min   GFR calc Af Amer 15 (*) >90 mL/min  CBC WITH DIFFERENTIAL      Result Value Range   WBC 16.5 (*) 4.0 - 10.5 K/uL   RBC 3.74 (*) 3.87 - 5.11 MIL/uL   Hemoglobin 10.6 (*) 12.0 - 15.0 g/dL   HCT 04.5 (*) 40.9 - 81.1 %   MCV 90.1  78.0 - 100.0 fL   MCH 28.3  26.0 - 34.0 pg   MCHC 31.5  30.0 - 36.0 g/dL   RDW 91.4 (*) 78.2 - 95.6 %   Platelets 367  150 - 400 K/uL   Neutrophils Relative % 87 (*) 43 - 77 %   Neutro Abs 14.3 (*) 1.7 - 7.7 K/uL   Lymphocytes Relative 4 (*) 12 - 46 %   Lymphs Abs 0.6 (*) 0.7 - 4.0 K/uL   Monocytes Relative 8  3 - 12 %   Monocytes Absolute 1.3 (*) 0.1 - 1.0 K/uL   Eosinophils Relative 2  0 - 5 %   Eosinophils Absolute 0.3  0.0 - 0.7 K/uL   Basophils Relative 0  0 - 1 %   Basophils Absolute 0.0  0.0 - 0.1 K/uL  LEGIONELLA ANTIGEN, URINE      Result Value Range   Specimen Description URINE, CLEAN CATCH     Special Requests NONE     Legionella Antigen, Urine Negative for Legionella pneumophilia serogroup 1     Report Status 06/30/2012 FINAL    STREP PNEUMONIAE URINARY ANTIGEN      Result Value Range   Strep Pneumo Urinary Antigen  NEGATIVE  NEGATIVE  BASIC METABOLIC PANEL      Result Value Range   Sodium 135  135 - 145 mEq/L   Potassium 3.5  3.5 - 5.1 mEq/L   Chloride 94 (*) 96 - 112 mEq/L   CO2 27  19 - 32 mEq/L   Glucose, Bld 65 (*) 70 - 99 mg/dL   BUN 28 (*) 6 - 23 mg/dL   Creatinine, Ser 2.13 (*) 0.50 - 1.10 mg/dL   Calcium 8.8  8.4 - 08.6 mg/dL   GFR calc non Af Amer 11 (*) >90 mL/min   GFR calc Af Amer 13 (*) >90 mL/min  CBC      Result Value Range   WBC 14.2 (*) 4.0 - 10.5 K/uL   RBC 3.56 (*) 3.87 - 5.11 MIL/uL   Hemoglobin 9.9 (*) 12.0 - 15.0 g/dL   HCT 57.8 (*) 46.9 - 62.9 %   MCV 90.2  78.0 - 100.0 fL   MCH 27.8  26.0 - 34.0 pg   MCHC 30.8  30.0 - 36.0 g/dL   RDW 52.8 (*) 41.3 - 24.4 %  Platelets 332  150 - 400 K/uL  CBC      Result Value Range   WBC 14.7 (*) 4.0 - 10.5 K/uL   RBC 3.54 (*) 3.87 - 5.11 MIL/uL   Hemoglobin 9.8 (*) 12.0 - 15.0 g/dL   HCT 08.6 (*) 57.8 - 46.9 %   MCV 89.3  78.0 - 100.0 fL   MCH 27.7  26.0 - 34.0 pg   MCHC 31.0  30.0 - 36.0 g/dL   RDW 62.9 (*) 52.8 - 41.3 %   Platelets 355  150 - 400 K/uL  RENAL FUNCTION PANEL      Result Value Range   Sodium 132 (*) 135 - 145 mEq/L   Potassium 3.5  3.5 - 5.1 mEq/L   Chloride 94 (*) 96 - 112 mEq/L   CO2 27  19 - 32 mEq/L   Glucose, Bld 75  70 - 99 mg/dL   BUN 29 (*) 6 - 23 mg/dL   Creatinine, Ser 2.44 (*) 0.50 - 1.10 mg/dL   Calcium 8.9  8.4 - 01.0 mg/dL   Phosphorus 3.6  2.3 - 4.6 mg/dL   Albumin 1.9 (*) 3.5 - 5.2 g/dL   GFR calc non Af Amer 12 (*) >90 mL/min   GFR calc Af Amer 14 (*) >90 mL/min  CBC      Result Value Range   WBC 13.1 (*) 4.0 - 10.5 K/uL   RBC 3.89  3.87 - 5.11 MIL/uL   Hemoglobin 11.0 (*) 12.0 - 15.0 g/dL   HCT 27.2 (*) 53.6 - 64.4 %   MCV 90.5  78.0 - 100.0 fL   MCH 28.3  26.0 - 34.0 pg   MCHC 31.3  30.0 - 36.0 g/dL   RDW 03.4 (*) 74.2 - 59.5 %   Platelets 385  150 - 400 K/uL  RENAL FUNCTION PANEL      Result Value Range   Sodium 134 (*) 135 - 145 mEq/L   Potassium 4.8  3.5 - 5.1 mEq/L    Chloride 98  96 - 112 mEq/L   CO2 28  19 - 32 mEq/L   Glucose, Bld 112 (*) 70 - 99 mg/dL   BUN 25 (*) 6 - 23 mg/dL   Creatinine, Ser 6.38 (*) 0.50 - 1.10 mg/dL   Calcium 9.3  8.4 - 75.6 mg/dL   Phosphorus 2.6  2.3 - 4.6 mg/dL   Albumin 2.0 (*) 3.5 - 5.2 g/dL   GFR calc non Af Amer 17 (*) >90 mL/min   GFR calc Af Amer 19 (*) >90 mL/min  CBC      Result Value Range   WBC 14.1 (*) 4.0 - 10.5 K/uL   RBC 3.65 (*) 3.87 - 5.11 MIL/uL   Hemoglobin 10.3 (*) 12.0 - 15.0 g/dL   HCT 43.3 (*) 29.5 - 18.8 %   MCV 90.1  78.0 - 100.0 fL   MCH 28.2  26.0 - 34.0 pg   MCHC 31.3  30.0 - 36.0 g/dL   RDW 41.6 (*) 60.6 - 30.1 %   Platelets 327  150 - 400 K/uL  RENAL FUNCTION PANEL      Result Value Range   Sodium 131 (*) 135 - 145 mEq/L   Potassium 4.3  3.5 - 5.1 mEq/L   Chloride 93 (*) 96 - 112 mEq/L   CO2 29  19 - 32 mEq/L   Glucose, Bld 72  70 - 99 mg/dL   BUN 34 (*) 6 - 23 mg/dL  Creatinine, Ser 3.10 (*) 0.50 - 1.10 mg/dL   Calcium 9.2  8.4 - 96.0 mg/dL   Phosphorus 3.4  2.3 - 4.6 mg/dL   Albumin 2.1 (*) 3.5 - 5.2 g/dL   GFR calc non Af Amer 12 (*) >90 mL/min   GFR calc Af Amer 14 (*) >90 mL/min  CBC      Result Value Range   WBC 16.0 (*) 4.0 - 10.5 K/uL   RBC 3.70 (*) 3.87 - 5.11 MIL/uL   Hemoglobin 10.4 (*) 12.0 - 15.0 g/dL   HCT 45.4 (*) 09.8 - 11.9 %   MCV 90.5  78.0 - 100.0 fL   MCH 28.1  26.0 - 34.0 pg   MCHC 31.0  30.0 - 36.0 g/dL   RDW 14.7 (*) 82.9 - 56.2 %   Platelets 352  150 - 400 K/uL  RENAL FUNCTION PANEL      Result Value Range   Sodium 130 (*) 135 - 145 mEq/L   Potassium 3.8  3.5 - 5.1 mEq/L   Chloride 91 (*) 96 - 112 mEq/L   CO2 29  19 - 32 mEq/L   Glucose, Bld 71  70 - 99 mg/dL   BUN 29 (*) 6 - 23 mg/dL   Creatinine, Ser 1.30 (*) 0.50 - 1.10 mg/dL   Calcium 9.4  8.4 - 86.5 mg/dL   Phosphorus 3.4  2.3 - 4.6 mg/dL   Albumin 2.2 (*) 3.5 - 5.2 g/dL   GFR calc non Af Amer 16 (*) >90 mL/min   GFR calc Af Amer 19 (*) >90 mL/min   Ct Head Wo Contrast  08/13/2013    CLINICAL DATA:  Fall with trauma to the head and face.  EXAM: CT HEAD WITHOUT CONTRAST  CT MAXILLOFACIAL WITHOUT CONTRAST  TECHNIQUE: Multidetector CT imaging of the head and maxillofacial structures were performed using the standard protocol without intravenous contrast. Multiplanar CT image reconstructions of the maxillofacial structures were also generated.  COMPARISON:  None.  FINDINGS: CT HEAD FINDINGS  Brain shows generalized age related atrophy. There chronic small vessel changes throughout the cerebral hemispheric white matter. No sign of acute infarction, mass lesion, hemorrhage, hydrocephalus or extra-axial collection. No skull fracture. No fluid in the sinuses, middle ears or mastoids. There is atherosclerotic calcification of the major vessels at the base of the brain.  CT MAXILLOFACIAL FINDINGS  Soft tissue injury is noted in the right face with a small amount of soft tissue air. No facial bone fracture. No fluid in the sinuses, middle ears or mastoids. There is some ordinary degenerative changes of the temporomandibular joints.  IMPRESSION: Head CT: No acute or traumatic finding. Atrophy and chronic small vessel disease.  Face CT: Soft tissue injury in the right cheek area. No facial fracture.   Electronically Signed   By: Paulina Fusi M.D.   On: 08/13/2013 14:49   Ct Maxillofacial Wo Cm  08/13/2013   CLINICAL DATA:  Fall with trauma to the head and face.  EXAM: CT HEAD WITHOUT CONTRAST  CT MAXILLOFACIAL WITHOUT CONTRAST  TECHNIQUE: Multidetector CT imaging of the head and maxillofacial structures were performed using the standard protocol without intravenous contrast. Multiplanar CT image reconstructions of the maxillofacial structures were also generated.  COMPARISON:  None.  FINDINGS: CT HEAD FINDINGS  Brain shows generalized age related atrophy. There chronic small vessel changes throughout the cerebral hemispheric white matter. No sign of acute infarction, mass lesion, hemorrhage,  hydrocephalus or extra-axial collection. No skull fracture. No fluid in  the sinuses, middle ears or mastoids. There is atherosclerotic calcification of the major vessels at the base of the brain.  CT MAXILLOFACIAL FINDINGS  Soft tissue injury is noted in the right face with a small amount of soft tissue air. No facial bone fracture. No fluid in the sinuses, middle ears or mastoids. There is some ordinary degenerative changes of the temporomandibular joints.  IMPRESSION: Head CT: No acute or traumatic finding. Atrophy and chronic small vessel disease.  Face CT: Soft tissue injury in the right cheek area. No facial fracture.   Electronically Signed   By: Paulina Fusi M.D.   On: 08/13/2013 14:49     MDM   1. Head injury, initial encounter   2. Laceration     Patient seen by and discussed with Dr. Lynelle Doctor, who recommends Tegaderm and sutures if needed.  CT max/fac and CT head pending.  C-spine cleared by nexus.   Laceration repaired as suggested by Dr. Lynelle Doctor.  CTs are negative.  Patient is stable and ready for discharge.  Roxy Horseman, PA-C 08/13/13 [phone]

## 2013-08-15 NOTE — ED Provider Notes (Signed)
See prior note   Ward Givens, MD 08/15/13 1501

## 2013-11-24 IMAGING — CR DG CHEST 1V PORT
1 series · 1 of 1 positions shown · non-contrast
Comparison: 06/24/2012 and earlier.

CLINICAL DATA: 89-year-old female shortness of breath.

PORTABLE CHEST - 1 VIEW

[AP]
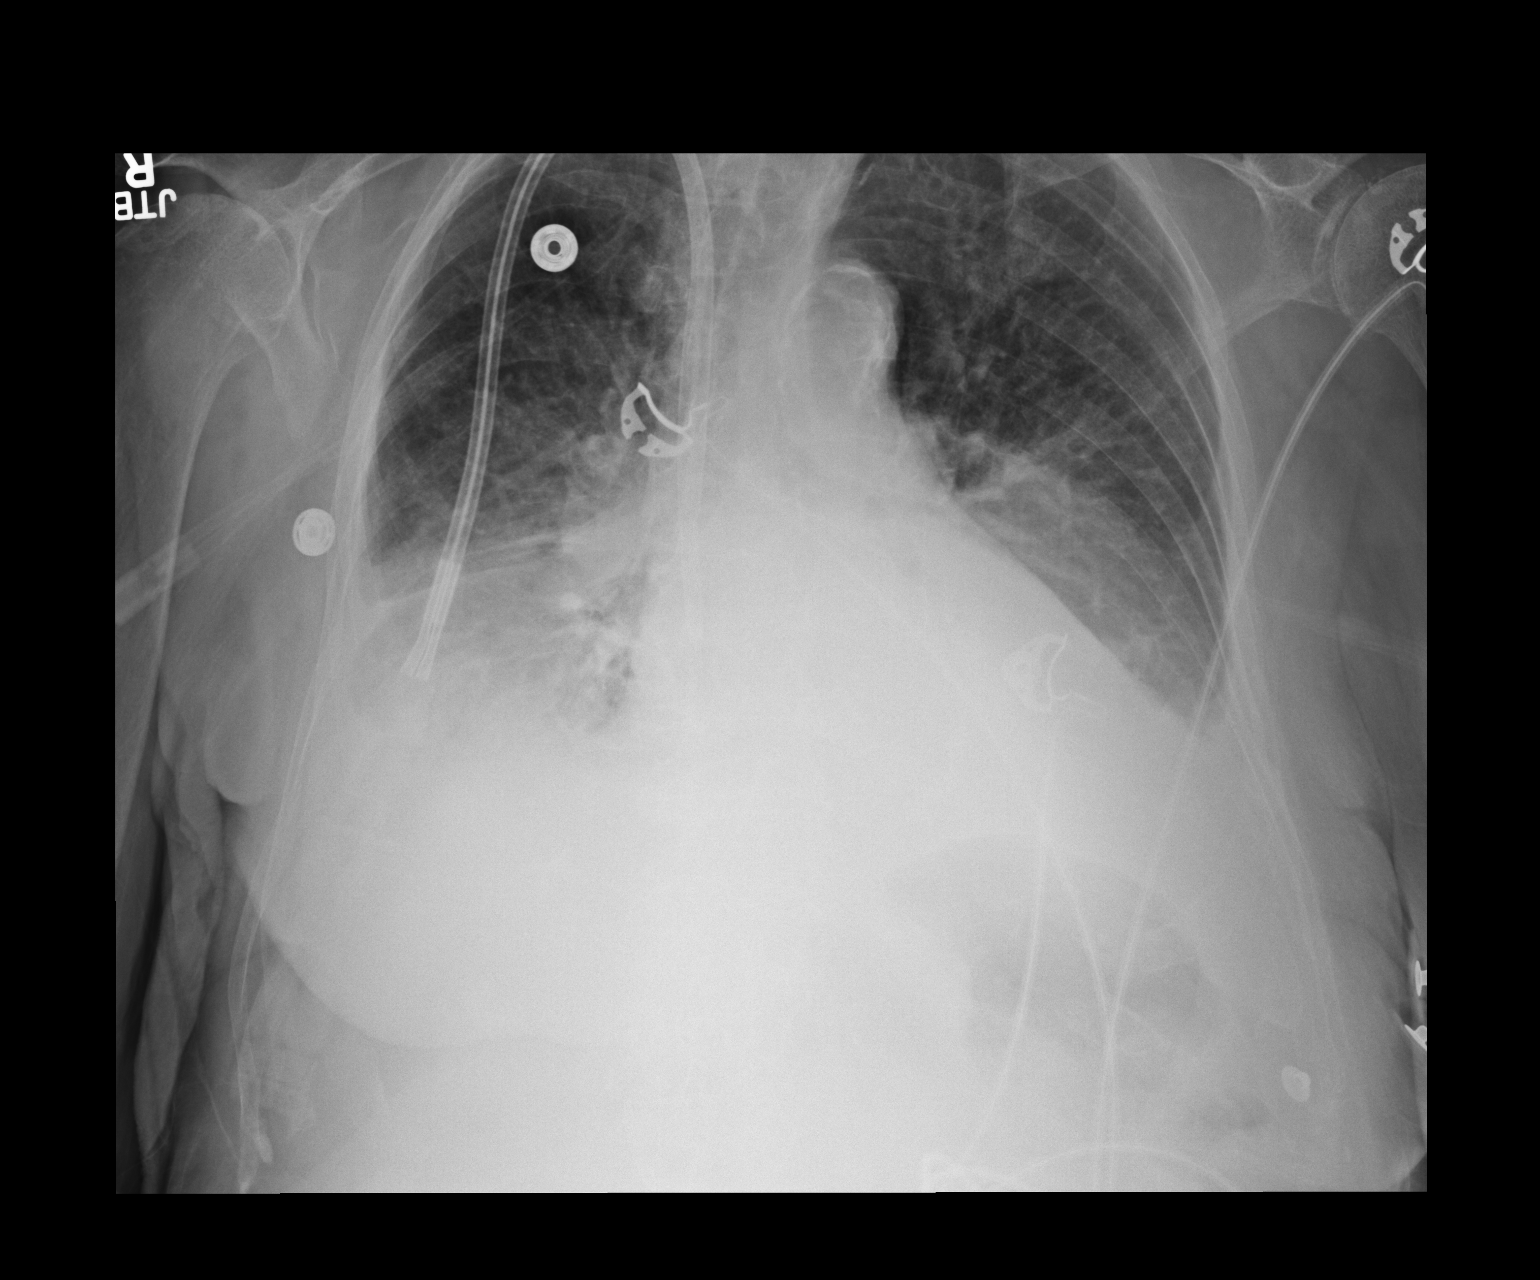

[1 of 1 positions shown; findings below may reference images not displayed]

FINDINGS: Seated AP portable view at 8008 hours.  Stable right IJ
approach dual lumen dialysis type catheter.  Increase veiling
opacity in the lower lungs compatible with pleural effusions.
Associated dense retrocardiac and lung base opacity.  Increased
pulmonary vascular congestion.  No pneumothorax.  Stable cardiac
size and mediastinal contours.
IMPRESSION: Increased pleural effusions and pulmonary vascularity most
suggestive of volume overload/interstitial edema.  Associated
bibasilar collapse or consolidation.

## 2013-11-29 IMAGING — CR DG CHEST 2V
2 series · 2 of 2 positions shown · non-contrast
Comparison: 06/29/2012

CLINICAL DATA: Weakness, shortness of breath

CHEST - 2 VIEW

[w chest lat]
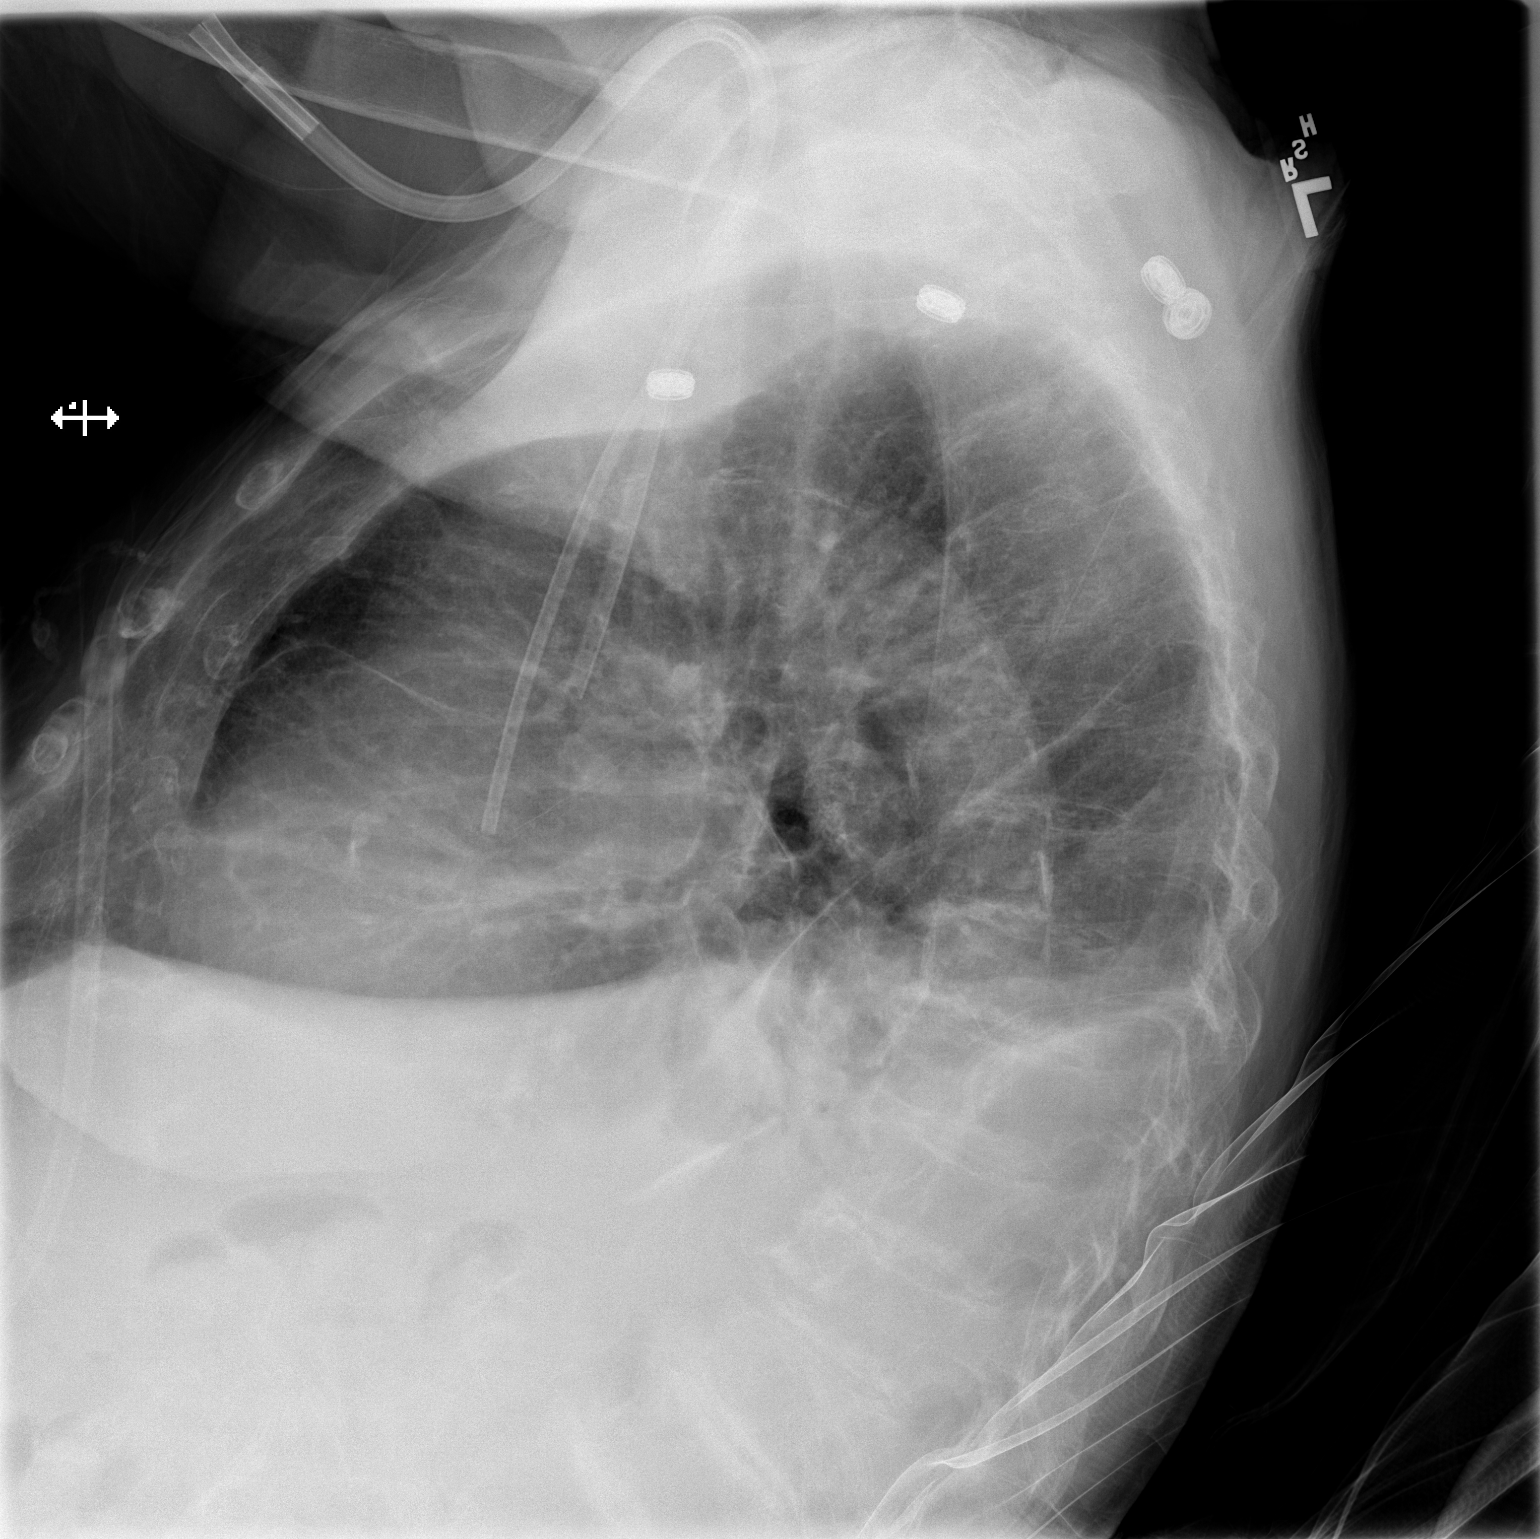

[x chest ap]
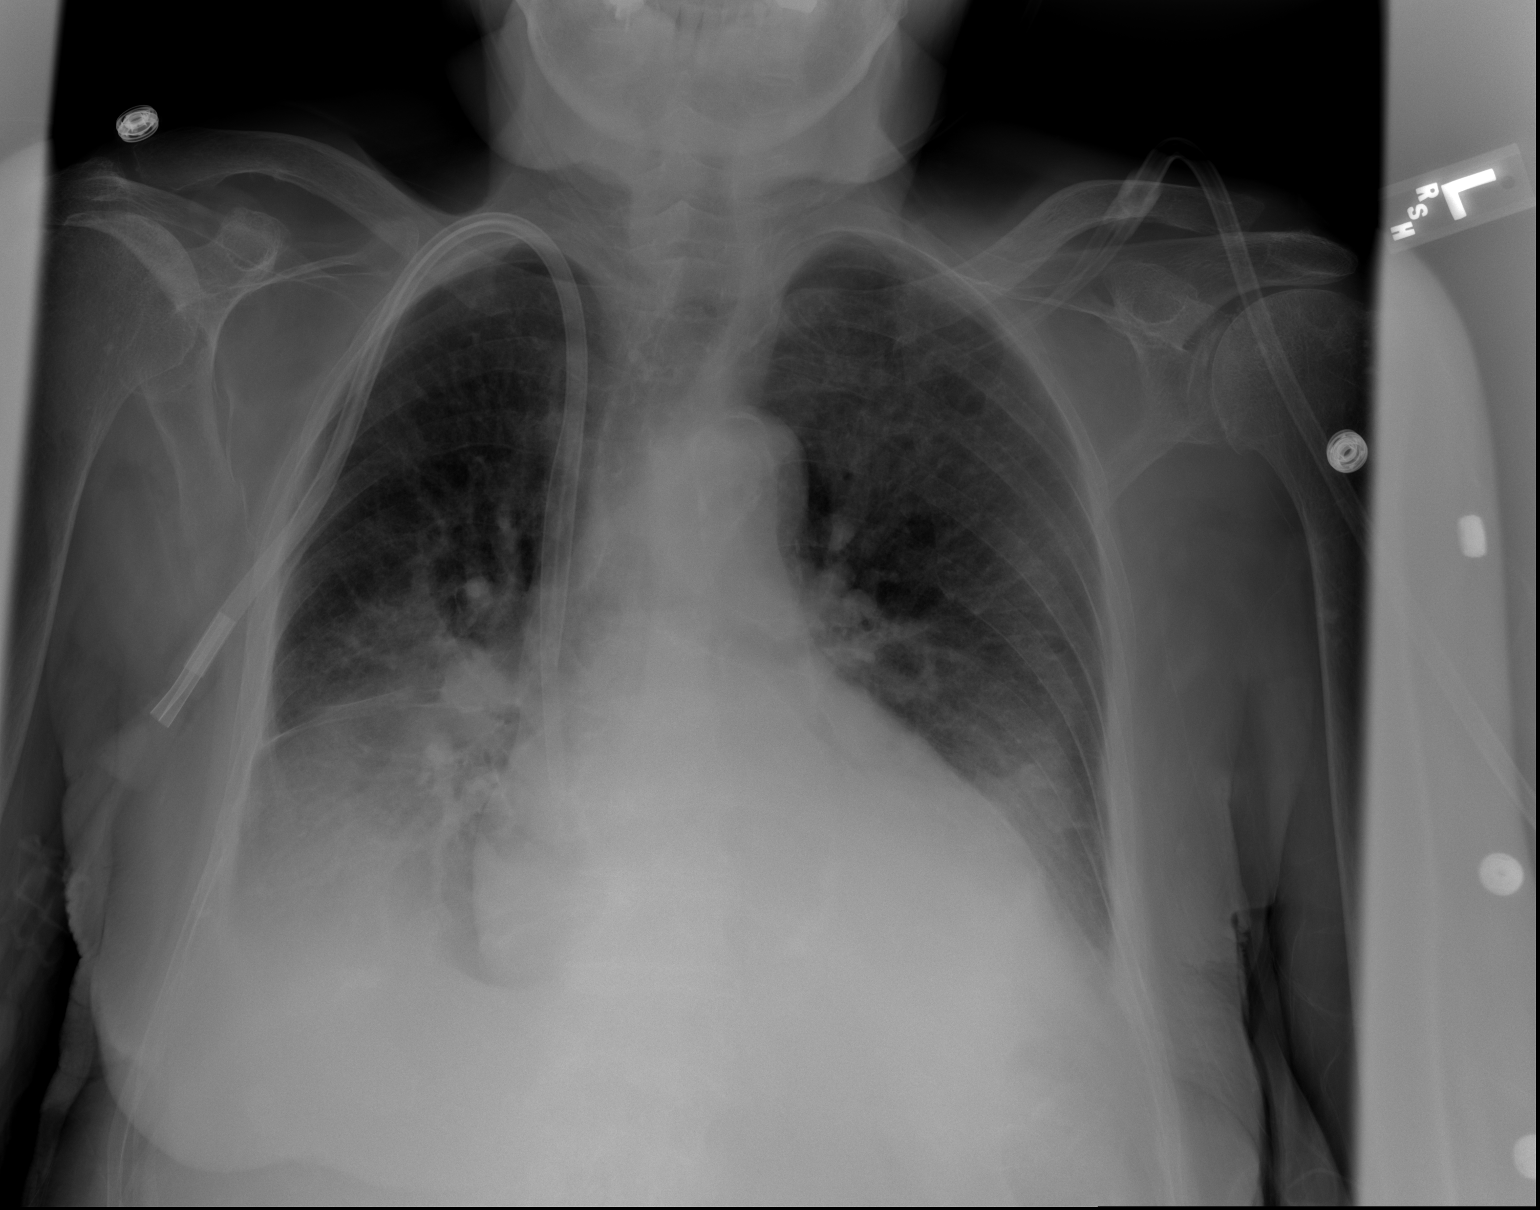

[2 of 2 positions shown; findings below may reference images not displayed]

FINDINGS: Dual lead right-sided hemodialysis catheter tips
terminate over the distal SVC and high right atrium, respectively.
Stable bilateral moderate posteriorly tracking pleural effusions
obscuring the lung bases.  Stable moderate enlargement of the
cardiomediastinal silhouette.  Central vascular congestion is noted
with a few basilar Kerley B lines.  These are new.
IMPRESSION: Moderate pleural effusions with new interstitial Kerley B lines
could suggest developing interstitial edema.

## 2013-12-26 DEATH — deceased

## 2014-08-06 ENCOUNTER — Encounter (HOSPITAL_COMMUNITY): Payer: Self-pay | Admitting: Vascular Surgery
